# Patient Record
Sex: Female | Born: 1970 | Race: White | Hispanic: No | Marital: Married | Smoking: Never smoker
Health system: Southern US, Community
[De-identification: ages and names within clinical notes are randomized; demographics above are authoritative.]

## PROBLEM LIST (undated history)

## (undated) DIAGNOSIS — E079 Disorder of thyroid, unspecified: Secondary | ICD-10-CM

## (undated) DIAGNOSIS — J189 Pneumonia, unspecified organism: Secondary | ICD-10-CM

## (undated) DIAGNOSIS — E119 Type 2 diabetes mellitus without complications: Secondary | ICD-10-CM

## (undated) DIAGNOSIS — E039 Hypothyroidism, unspecified: Secondary | ICD-10-CM

## (undated) DIAGNOSIS — T8859XA Other complications of anesthesia, initial encounter: Secondary | ICD-10-CM

## (undated) DIAGNOSIS — I10 Essential (primary) hypertension: Secondary | ICD-10-CM

## (undated) DIAGNOSIS — F32A Depression, unspecified: Secondary | ICD-10-CM

## (undated) DIAGNOSIS — F329 Major depressive disorder, single episode, unspecified: Secondary | ICD-10-CM

## (undated) DIAGNOSIS — R112 Nausea with vomiting, unspecified: Secondary | ICD-10-CM

## (undated) DIAGNOSIS — J45909 Unspecified asthma, uncomplicated: Secondary | ICD-10-CM

## (undated) DIAGNOSIS — R0602 Shortness of breath: Secondary | ICD-10-CM

## (undated) DIAGNOSIS — G709 Myoneural disorder, unspecified: Secondary | ICD-10-CM

## (undated) DIAGNOSIS — Z9889 Other specified postprocedural states: Secondary | ICD-10-CM

## (undated) DIAGNOSIS — T7840XA Allergy, unspecified, initial encounter: Secondary | ICD-10-CM

## (undated) DIAGNOSIS — Z8489 Family history of other specified conditions: Secondary | ICD-10-CM

## (undated) DIAGNOSIS — G4730 Sleep apnea, unspecified: Secondary | ICD-10-CM

## (undated) DIAGNOSIS — T4145XA Adverse effect of unspecified anesthetic, initial encounter: Secondary | ICD-10-CM

## (undated) HISTORY — DX: Major depressive disorder, single episode, unspecified: F32.9

## (undated) HISTORY — DX: Sleep apnea, unspecified: G47.30

## (undated) HISTORY — PX: WISDOM TOOTH EXTRACTION: SHX21

## (undated) HISTORY — DX: Type 2 diabetes mellitus without complications: E11.9

## (undated) HISTORY — DX: Shortness of breath: R06.02

## (undated) HISTORY — DX: Myoneural disorder, unspecified: G70.9

## (undated) HISTORY — DX: Depression, unspecified: F32.A

## (undated) HISTORY — PX: NASAL SINUS SURGERY: SHX719

## (undated) HISTORY — DX: Allergy, unspecified, initial encounter: T78.40XA

---
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
## 2012-10-09 HISTORY — PX: REDUCTION MAMMAPLASTY: SUR839

## 2013-01-07 HISTORY — PX: BREAST SURGERY: SHX581

## 2013-12-16 LAB — CBC AND DIFFERENTIAL
HCT: 42 % (ref 36–46)
HEMOGLOBIN: 14.5 g/dL (ref 12.0–16.0)
Neutrophils Absolute: 6 /uL
PLATELETS: 245 10*3/uL (ref 150–399)
WBC: 8.2 10*3/mL

## 2014-02-06 HISTORY — PX: NASAL SINUS SURGERY: SHX719

## 2014-02-09 LAB — HM PAP SMEAR: HM PAP: NORMAL

## 2014-02-09 LAB — HM MAMMOGRAPHY: HM MAMMO: NEGATIVE

## 2015-05-24 ENCOUNTER — Other Ambulatory Visit: Payer: Self-pay | Admitting: Student

## 2015-05-24 DIAGNOSIS — Z1231 Encounter for screening mammogram for malignant neoplasm of breast: Secondary | ICD-10-CM

## 2015-07-07 ENCOUNTER — Ambulatory Visit
Admission: EM | Admit: 2015-07-07 | Discharge: 2015-07-07 | Disposition: A | Discharge status: Home / Self Care | Payer: 59 | Attending: Family Medicine | Admitting: Family Medicine

## 2015-07-07 DIAGNOSIS — J069 Acute upper respiratory infection, unspecified: Secondary | ICD-10-CM

## 2015-07-07 HISTORY — DX: Unspecified asthma, uncomplicated: J45.909

## 2015-07-07 HISTORY — DX: Disorder of thyroid, unspecified: E07.9

## 2015-07-07 HISTORY — DX: Pneumonia, unspecified organism: J18.9

## 2015-07-07 MED ORDER — HYDROCOD POLST-CPM POLST ER 10-8 MG/5ML PO SUER: 5.0000 mL | Freq: Every evening | ORAL | Status: DC | PRN

## 2015-07-07 MED ORDER — AZITHROMYCIN 250 MG PO TABS: ORAL_TABLET | ORAL | Status: DC

## 2015-07-07 NOTE — ED Provider Notes (Signed)
SUBJECTIVE:  Bridget Peters is a 44 y.o. female who complains of sore throat, nasal congestion, cough, right ear pain for the last few days. Denies CP, SOB, N/V/D, abdominal pain, severe headache. Has tried OTC Medication without much relief. Has asthma and takes Singulair and Albuterol prn. Denies smoking hx.    OBJECTIVE: She appears well, vital signs are as noted.  General: NAD HEENT: mild pharyngeal erythema, no exudate, no erythema of TMs, no cervical LAD Respiratory: minimal expiratory wheeze, no accessory muscle use  Cardiology: RRR Neurological: CN II-XII grossly intact   ASSESSMENT:  URI  PLAN: Zithromax, OTC cough suppressant during the day, Tussionex at bedtime prn, Singulair, Albuterol prn, rest, hydration, seek medical attention if symptoms persist or worsen as discussed. Patient needs to monitor blood pressure and if remains elevated (>140/90) needs to f/u with PMD.        Paulina Fusi, MD 07/07/15 [phone]

## 2015-07-07 NOTE — ED Notes (Signed)
Pt states "sinus pressure, headache, ear pain started last Tuesday. I went to the doctor on Monday, I'm worse, short of breath, coughing all night."

## 2015-07-14 ENCOUNTER — Encounter: Payer: Self-pay | Admitting: Nurse Practitioner

## 2015-07-14 ENCOUNTER — Ambulatory Visit (INDEPENDENT_AMBULATORY_CARE_PROVIDER_SITE_OTHER): Payer: 59 | Admitting: Nurse Practitioner

## 2015-07-14 ENCOUNTER — Encounter (INDEPENDENT_AMBULATORY_CARE_PROVIDER_SITE_OTHER): Payer: Self-pay

## 2015-07-14 VITALS — BP 150/96 | HR 87 | Temp 98.3°F | Resp 16 | Ht 62.5 in | Wt 169.2 lb

## 2015-07-14 DIAGNOSIS — E039 Hypothyroidism, unspecified: Secondary | ICD-10-CM

## 2015-07-14 DIAGNOSIS — J4540 Moderate persistent asthma, uncomplicated: Secondary | ICD-10-CM | POA: Insufficient documentation

## 2015-07-14 DIAGNOSIS — Z1231 Encounter for screening mammogram for malignant neoplasm of breast: Secondary | ICD-10-CM | POA: Insufficient documentation

## 2015-07-14 DIAGNOSIS — J069 Acute upper respiratory infection, unspecified: Secondary | ICD-10-CM

## 2015-07-14 DIAGNOSIS — R7303 Prediabetes: Secondary | ICD-10-CM | POA: Insufficient documentation

## 2015-07-14 DIAGNOSIS — E119 Type 2 diabetes mellitus without complications: Secondary | ICD-10-CM

## 2015-07-14 DIAGNOSIS — Z7189 Other specified counseling: Secondary | ICD-10-CM

## 2015-07-14 DIAGNOSIS — Z91048 Other nonmedicinal substance allergy status: Secondary | ICD-10-CM

## 2015-07-14 DIAGNOSIS — Z7689 Persons encountering health services in other specified circumstances: Secondary | ICD-10-CM

## 2015-07-14 DIAGNOSIS — J4541 Moderate persistent asthma with (acute) exacerbation: Secondary | ICD-10-CM

## 2015-07-14 DIAGNOSIS — Z9109 Other allergy status, other than to drugs and biological substances: Secondary | ICD-10-CM | POA: Insufficient documentation

## 2015-07-14 MED ORDER — HYDROCOD POLST-CPM POLST ER 10-8 MG/5ML PO SUER: 5.0000 mL | Freq: Every evening | ORAL | Status: DC | PRN

## 2015-07-14 MED ORDER — PREDNISONE 10 MG PO TABS: ORAL_TABLET | ORAL | Status: DC

## 2015-07-14 MED ORDER — DOXYCYCLINE HYCLATE 100 MG PO TABS: 100.0000 mg | ORAL_TABLET | Freq: Two times a day (BID) | ORAL | Status: DC

## 2015-07-14 MED ORDER — CANAGLIFLOZIN 300 MG PO TABS: 300.0000 mg | ORAL_TABLET | Freq: Every day | ORAL | Status: DC

## 2015-07-14 NOTE — Assessment & Plan Note (Signed)
Using Nebs, singulair, and inhaler. Somewhat helpful. Requesting Prednisone taper. Also, referring to pulmonology to establish care.

## 2015-07-14 NOTE — Patient Instructions (Addendum)
Nice to meet you! Welcome to [facility]!   Take the antibiotic as prescribed.   Please take a probiotic ( Align, Floraque or Culturelle) while you are on the antibiotic to prevent a serious antibiotic associated diarrhea  Called clostirudium dificile colitis and a vaginal yeast infection.   Tussionex- will make you drowsy, caution with driving or doing any legal matters. Take 1 teaspoon (5 mL) at night.   Prednisone with breakfast  6 tablets on day 1, 5 tablets on day 2, 4 tablets on day 3, 3 tablets on day 4, 2 tablets day 5, 1 tablet on day 6...done! Take together, not spread out throughout day, and don't take with NSAIDs like Ibuprofen.   We will contact you with your referrals.  Follow up in 2 months.

## 2015-07-14 NOTE — Assessment & Plan Note (Signed)
UC on 07/07/15. Pt reports z-packs not helpful, usually does well with doxy. Will give for bid x 7 days, encouraged probiotics. Prednisone taper given. Tussionex script given for pt to take to pharmacy. She ran out of the last bottle given and it was helpful.

## 2015-07-14 NOTE — Assessment & Plan Note (Signed)
Will obtain TSH and T4 levels today. Will follow as needed. Declines needing refills.

## 2015-07-14 NOTE — Progress Notes (Signed)
Pre visit review using our clinic review tool, if applicable. No additional management support is needed unless otherwise documented below in the visit note. 

## 2015-07-14 NOTE — Assessment & Plan Note (Addendum)
Needs to establish with allergist. Pt reports many food and environmental allergies. Denies Drug allergies. Allergist referral placed

## 2015-07-14 NOTE — Assessment & Plan Note (Addendum)
Will obtain A1c, microalbumin, and CMET today. Needs refills on Invokana 300 mg. Encouraged eye exam and will obtain foot exam at next visit.   Pneumovax in 2013

## 2015-07-14 NOTE — Progress Notes (Signed)
Patient ID: Bridget Peters, female    DOB: 02-Mar-1971  Age: 44 y.o. MRN: [id number]  CC: Establish Care   HPI Bridget Peters presents for establishing care and CC of URI symptoms.   1) New pt info:  Immunizations- pneumovax 2013, tdap within last 5 years   Flu- will get at work   Mammogram- Breast reduction in 2014   2015 normal   Pap- 2015   Eye Exam- 2014 Dec.  Dental exam- UTD  Foot exam- 2015   LMP- Age 41 post-menopausal   2) Chronic Problems-  Asthma- Inhaler, nebs, need pulmonologist  Diabetes- Invokana, checked last year   Allergies- Sinus surgery in 02/2014  Thyroid disease- hypothyroidism, checked last year 4-5 years stable on 75 mcg    Counseling in past 5 years  3) Acute Problems-  URI- went to urgent care last Wednesday with Z-pack given to pt. She has asthma and this is affecting her. Tussionex given for bedtime coughing, encouraged to continue singulair, albuterol and hydration. Pts BP was elevated 172/81 at the urgent care on 07/07/15.   Tried robitussin and mucinex.  Used nebulizer    BP is elevated.   Dr. Ernestina Patches in [place] Dr. Owens Shark in [place] and Allergist   History Bridget Peters has a past medical history of Asthma; Thyroid disease; Pneumonia; Diabetes mellitus without complication ([place]); and Allergy.   She has past surgical history that includes Nasal sinus surgery; Breast surgery (01/2013); and Nasal sinus surgery (02/2014).   Her family history includes Cancer in her maternal aunt, maternal grandfather, maternal grandmother, and paternal aunt.She reports that she has never smoked. She has never used smokeless tobacco. She reports that she does not drink alcohol or use illicit drugs.  Outpatient Prescriptions Prior to Visit  Medication Sig Dispense Refill  . fluticasone (FLONASE) 50 MCG/ACT nasal spray Place into both nostrils daily.    Marland Kitchen guaiFENesin (MUCINEX) 600 MG 12 hr tablet Take by mouth 2 (two) times daily.    Marland Kitchen guaiFENesin  (ROBITUSSIN) 100 MG/5ML liquid Take 200 mg by mouth 3 (three) times daily as needed for cough.    . mometasone-formoterol (DULERA) 100-5 MCG/ACT AERO Inhale 2 puffs into the lungs 2 (two) times daily.    . montelukast (SINGULAIR) 10 MG tablet Take 10 mg by mouth at bedtime.    Marland Kitchen azithromycin (ZITHROMAX Z-PAK) 250 MG tablet Use as directed for 5 days. (Patient not taking: Reported on 07/14/2015) 6 each 0  . chlorpheniramine-HYDROcodone (TUSSIONEX PENNKINETIC ER) 10-8 MG/5ML SUER Take 5 mLs by mouth at bedtime as needed for cough. May cause drowsiness. 120 mL 0   No facility-administered medications prior to visit.    ROS Review of Systems  Constitutional: Negative for fever, chills, diaphoresis and fatigue.  HENT: Positive for congestion. Negative for sore throat, tinnitus and trouble swallowing.   Respiratory: Positive for cough. Negative for chest tightness, shortness of breath and wheezing.   Cardiovascular: Negative for chest pain, palpitations and leg swelling.  Gastrointestinal: Negative for nausea, vomiting and diarrhea.  Skin: Negative for rash.  Neurological: Positive for headaches. Negative for dizziness, weakness and numbness.  Psychiatric/Behavioral: The patient is not nervous/anxious.     Objective:  BP 150/96 mmHg  Pulse 87  Temp(Src) 98.3 F (36.8 C)  Resp 16  Ht 5' 2.5" (1.588 m)  Wt 169 lb 3.2 oz (76.749 kg)  BMI 30.43 kg/m2  SpO2 97%  Physical Exam  Constitutional: She is oriented to person, place, and time. She appears well-developed  and well-nourished. No distress.  HENT:  Head: Normocephalic and atraumatic.  Right Ear: External ear normal.  Left Ear: External ear normal.  Mouth/Throat: No oropharyngeal exudate.  TM's visible landmarks, mild scarring on left, injected on right  Eyes: Right eye exhibits no discharge. Left eye exhibits no discharge. No scleral icterus.  Neck: Normal range of motion. Neck supple. No thyromegaly present.  Cardiovascular: Normal  rate, regular rhythm and normal heart sounds.  Exam reveals no gallop and no friction rub.   No murmur heard. Pulmonary/Chest: Effort normal. No respiratory distress. She has wheezes. She has no rales. She exhibits no tenderness.  Rhonchi  Lymphadenopathy:    She has no cervical adenopathy.  Neurological: She is alert and oriented to person, place, and time. No cranial nerve deficit. She exhibits normal muscle tone. Coordination normal.  Skin: Skin is warm and dry. No rash noted. She is not diaphoretic.  Psychiatric: She has a normal mood and affect. Her behavior is normal. Judgment and thought content normal.   Assessment & Plan:   Bridget Peters was seen today for establish care.  Diagnoses and all orders for this visit:  Encounter to establish care  Hypothyroidism, unspecified hypothyroidism type -     TSH -     T4, free  Controlled type 2 diabetes mellitus without complication, without long-term current use of insulin (HCC) -     HgB A1c -     Urine Microalbumin w/creat. ratio -     CBC w/Diff -     Comprehensive metabolic panel  Multiple environmental allergies -     Ambulatory referral to Allergy  Asthma, moderate persistent, with acute exacerbation -     Ambulatory referral to Pulmonology  Acute upper respiratory infection  Other orders -     doxycycline (VIBRA-TABS) 100 MG tablet; Take 1 tablet (100 mg total) by mouth 2 (two) times daily. -     canagliflozin (INVOKANA) 300 MG TABS tablet; Take 300 mg by mouth daily. -     predniSONE (DELTASONE) 10 MG tablet; Take 6 tablets by mouth on day 1 with breakfast then decrease by 1 tablet each day until gone. -     chlorpheniramine-HYDROcodone (TUSSIONEX PENNKINETIC ER) 10-8 MG/5ML SUER; Take 5 mLs by mouth at bedtime as needed for cough. May cause drowsiness.   I have discontinued Ms. Boyack's azithromycin. I have also changed her INVOKANA to canagliflozin. Additionally, I am having her start on doxycycline and predniSONE.  Lastly, I am having her maintain her fluticasone, mometasone-formoterol, montelukast, guaiFENesin, guaiFENesin, levothyroxine, and chlorpheniramine-HYDROcodone.  Meds ordered this encounter  Medications  . DISCONTD: INVOKANA 300 MG TABS tablet    Sig: Take 300 tablets by mouth daily.    Refill:  8  . levothyroxine (SYNTHROID, LEVOTHROID) 75 MCG tablet    Sig: Take 75 tablets by mouth daily.    Refill:  2  . doxycycline (VIBRA-TABS) 100 MG tablet    Sig: Take 1 tablet (100 mg total) by mouth 2 (two) times daily.    Dispense:  14 tablet    Refill:  0    Order Specific Question:  Supervising Provider    Answer:  Deborra Medina L [[id number]]  . canagliflozin (INVOKANA) 300 MG TABS tablet    Sig: Take 300 mg by mouth daily.    Dispense:  30 tablet    Refill:  2    Order Specific Question:  Supervising Provider    Answer:  Deborra Medina L [[id number]]  . predniSONE (DELTASONE)  10 MG tablet    Sig: Take 6 tablets by mouth on day 1 with breakfast then decrease by 1 tablet each day until gone.    Dispense:  21 tablet    Refill:  0    Order Specific Question:  Supervising Provider    Answer:  Deborra Medina L [[id number]]  . chlorpheniramine-HYDROcodone (TUSSIONEX PENNKINETIC ER) 10-8 MG/5ML SUER    Sig: Take 5 mLs by mouth at bedtime as needed for cough. May cause drowsiness.    Dispense:  120 mL    Refill:  0    Order Specific Question:  Supervising Provider    Answer:  Crecencio Mc [[id number]]     Follow-up: Return in about 2 months (around 09/13/2015) for Follow up .

## 2015-07-14 NOTE — Assessment & Plan Note (Signed)
Discussed acute and chronic issues. Reviewed health maintenance measures, PFSHx, and immunizations. Obtained records, reviewing and sending to scan.

## 2015-07-15 ENCOUNTER — Encounter: Payer: Self-pay | Admitting: Nurse Practitioner

## 2015-07-15 LAB — CBC WITH DIFFERENTIAL/PLATELET
BASOS ABS: 0 10*3/uL (ref 0.0–0.1)
Basophils Relative: 0.3 % (ref 0.0–3.0)
EOS ABS: 0.2 10*3/uL (ref 0.0–0.7)
Eosinophils Relative: 2.1 % (ref 0.0–5.0)
HEMATOCRIT: 45.2 % (ref 36.0–46.0)
HEMOGLOBIN: 15.1 g/dL — AB (ref 12.0–15.0)
LYMPHS PCT: 32.8 % (ref 12.0–46.0)
Lymphs Abs: 2.6 10*3/uL (ref 0.7–4.0)
MCHC: 33.4 g/dL (ref 30.0–36.0)
MCV: 84.9 fl (ref 78.0–100.0)
MONOS PCT: 5.6 % (ref 3.0–12.0)
Monocytes Absolute: 0.4 10*3/uL (ref 0.1–1.0)
Neutro Abs: 4.7 10*3/uL (ref 1.4–7.7)
Neutrophils Relative %: 59.2 % (ref 43.0–77.0)
Platelets: 323 10*3/uL (ref 150.0–400.0)
RBC: 5.32 Mil/uL — AB (ref 3.87–5.11)
RDW: 13.4 % (ref 11.5–15.5)
WBC: 7.9 10*3/uL (ref 4.0–10.5)

## 2015-07-15 LAB — T4, FREE: Free T4: 0.89 ng/dL (ref 0.60–1.60)

## 2015-07-15 LAB — HEMOGLOBIN A1C: Hgb A1c MFr Bld: 7.5 % — ABNORMAL HIGH (ref 4.6–6.5)

## 2015-07-15 LAB — COMPREHENSIVE METABOLIC PANEL
ALBUMIN: 4.7 g/dL (ref 3.5–5.2)
ALK PHOS: 118 U/L — AB (ref 39–117)
ALT: 39 U/L — AB (ref 0–35)
AST: 24 U/L (ref 0–37)
BILIRUBIN TOTAL: 0.4 mg/dL (ref 0.2–1.2)
BUN: 14 mg/dL (ref 6–23)
CALCIUM: 10.3 mg/dL (ref 8.4–10.5)
CO2: 24 meq/L (ref 19–32)
CREATININE: 1.01 mg/dL (ref 0.40–1.20)
Chloride: 98 mEq/L (ref 96–112)
GFR: 63.14 mL/min (ref >60.00)
Glucose, Bld: 195 mg/dL — ABNORMAL HIGH (ref 70–99)
Potassium: 4.6 mEq/L (ref 3.5–5.1)
Sodium: 137 mEq/L (ref 135–145)
TOTAL PROTEIN: 8 g/dL (ref 6.0–8.3)

## 2015-07-15 LAB — TSH: TSH: 1.81 u[IU]/mL (ref 0.35–4.50)

## 2015-07-15 LAB — MICROALBUMIN / CREATININE URINE RATIO
CREATININE, U: 46.2 mg/dL
MICROALB UR: 0.7 mg/dL (ref 0.0–1.9)
Microalb Creat Ratio: 1.5 mg/g (ref 0.0–30.0)

## 2015-07-28 ENCOUNTER — Other Ambulatory Visit: Payer: Self-pay | Admitting: Nurse Practitioner

## 2015-07-28 ENCOUNTER — Telehealth: Payer: Self-pay

## 2015-07-28 MED ORDER — MONTELUKAST SODIUM 10 MG PO TABS: 10.0000 mg | ORAL_TABLET | Freq: Every day | ORAL | Status: DC

## 2015-07-28 NOTE — Telephone Encounter (Signed)
I received a phone message from patient stating that she has made an appt with the allergist, but they couldn't get her in before November 8. Patient states that she has been prescribed Singulair before, and wants to know if some can be sent in her for just to last until her allergist appt? Thank you!

## 2015-07-28 NOTE — Telephone Encounter (Signed)
I sent it to her pharmacy. Thanks!

## 2015-08-03 ENCOUNTER — Institutional Professional Consult (permissible substitution): Payer: 59 | Admitting: Internal Medicine

## 2015-08-06 ENCOUNTER — Ambulatory Visit (INDEPENDENT_AMBULATORY_CARE_PROVIDER_SITE_OTHER): Payer: 59 | Admitting: Internal Medicine

## 2015-08-06 ENCOUNTER — Encounter: Payer: Self-pay | Admitting: Internal Medicine

## 2015-08-06 VITALS — BP 116/72 | HR 84 | Ht 63.0 in | Wt 167.0 lb

## 2015-08-06 DIAGNOSIS — K219 Gastro-esophageal reflux disease without esophagitis: Secondary | ICD-10-CM | POA: Diagnosis not present

## 2015-08-06 DIAGNOSIS — J309 Allergic rhinitis, unspecified: Secondary | ICD-10-CM

## 2015-08-06 MED ORDER — FLUTICASONE FUROATE 27.5 MCG/SPRAY NA SUSP: 2.0000 | Freq: Every day | NASAL | Status: DC

## 2015-08-06 MED ORDER — UMECLIDINIUM BROMIDE 62.5 MCG/INH IN AEPB: 1.0000 | INHALATION_SPRAY | Freq: Every day | RESPIRATORY_TRACT | Status: DC

## 2015-08-06 MED ORDER — FLUTICASONE FUROATE-VILANTEROL 200-25 MCG/INH IN AEPB
1.0000 | INHALATION_SPRAY | Freq: Every day | RESPIRATORY_TRACT | Status: AC
Start: 2015-08-06 — End: 2015-08-07

## 2015-08-06 MED ORDER — PANTOPRAZOLE SODIUM 40 MG PO TBEC: 40.0000 mg | DELAYED_RELEASE_TABLET | Freq: Every day | ORAL | Status: DC

## 2015-08-06 MED ORDER — FLUTICASONE FUROATE-VILANTEROL 200-25 MCG/INH IN AEPB: 1.0000 | INHALATION_SPRAY | Freq: Every day | RESPIRATORY_TRACT | Status: DC

## 2015-08-06 MED ORDER — UMECLIDINIUM BROMIDE 62.5 MCG/INH IN AEPB: 1.0000 | INHALATION_SPRAY | Freq: Every day | RESPIRATORY_TRACT | Status: AC

## 2015-08-06 NOTE — Progress Notes (Signed)
[facility] Consultation      Date: 08/06/2015,   MRN# [id number] Navah Grondin 04/08/1971 Code Status:  Hosp day:@LENGTHOFSTAYDAYS @ Referring MD: @ATDPROV @     PCP:      AdmissionWeight: 167 lb (75.751 kg)                 CurrentWeight: 167 lb (75.751 kg) Laylia Carboni is a 44 y.o. old female seen in consultation for asthma     CHIEF COMPLAINT:   Sob, cough and intermittent wheezing   HISTORY OF PRESENT ILLNESS  44 yo white female with dx of ASTHMA dx approx 10 years ago Patient has seen allergist and pulmonologist in the past Patient on [place] and does not seem to help at this time She had 2 exacerbations in the past 2 years, no h/o intubations at this time She has chronic allergic rhinitis s/p sinus surgery in the past She has intermittent cough and SOB with wheezing, has taken douneb last night She has no fevers,chills or signs of infection at this time, she is nonsmoker She has tried multiple antihistamines and none have worked, she takes flonase does not help,  and singulair and  seems to help  Her last prednisone taper was 1 month ago from Urgent care  PAST MEDICAL HISTORY   Past Medical History  Diagnosis Date  . Asthma   . Thyroid disease   . Pneumonia   . Diabetes mellitus without complication ([place])   . Allergy     Seasonal     SURGICAL HISTORY   Past Surgical History  Procedure Laterality Date  . Nasal sinus surgery    . Breast surgery  01/2013    Breast Reduction   . Nasal sinus surgery  02/2014     FAMILY HISTORY   Family History  Problem Relation Age of Onset  . Cancer Maternal Aunt     Breast Cancer  . Cancer Paternal Aunt     Breast Cancer  . Cancer Maternal Grandmother     Bladder Cancer  . Stroke Maternal Grandmother   . Cancer Maternal Grandfather     Esophagus Cancer  . Hypertension Mother   . Hypertension Father      SOCIAL HISTORY   Social History  Substance Use Topics  . Smoking status:  Never Smoker   . Smokeless tobacco: Never Used  . Alcohol Use: No     MEDICATIONS    Home Medication:  Current Outpatient Rx  Name  Route  Sig  Dispense  Refill  . canagliflozin (INVOKANA) 300 MG TABS tablet   Oral   Take 300 mg by mouth daily.   30 tablet   2   . chlorpheniramine-HYDROcodone (TUSSIONEX PENNKINETIC ER) 10-8 MG/5ML SUER   Oral   Take 5 mLs by mouth at bedtime as needed for cough. May cause drowsiness.   120 mL   0   . doxycycline (VIBRA-TABS) 100 MG tablet   Oral   Take 1 tablet (100 mg total) by mouth 2 (two) times daily.   14 tablet   0   . fluticasone (FLONASE) 50 MCG/ACT nasal spray   Each Nare   Place into both nostrils daily.         Marland Kitchen guaiFENesin (MUCINEX) 600 MG 12 hr tablet   Oral   Take by mouth 2 (two) times daily.         Marland Kitchen levothyroxine (SYNTHROID, LEVOTHROID) 75 MCG tablet   Oral   Take 75 tablets by mouth  daily.      2   . mometasone-formoterol (DULERA) 100-5 MCG/ACT AERO   Inhalation   Inhale 2 puffs into the lungs 2 (two) times daily.         . montelukast (SINGULAIR) 10 MG tablet   Oral   Take 1 tablet (10 mg total) by mouth at bedtime.   30 tablet   1   . predniSONE (DELTASONE) 10 MG tablet      Take 6 tablets by mouth on day 1 with breakfast then decrease by 1 tablet each day until gone.   21 tablet   0   . fluticasone (VERAMYST) 27.5 MCG/SPRAY nasal spray   Nasal   Place 2 sprays into the nose daily.   10 g   12   . pantoprazole (PROTONIX) 40 MG tablet   Oral   Take 1 tablet (40 mg total) by mouth daily.   30 tablet   1     Current Medication:  Current outpatient prescriptions:  .  canagliflozin (INVOKANA) 300 MG TABS tablet, Take 300 mg by mouth daily., Disp: 30 tablet, Rfl: 2 .  chlorpheniramine-HYDROcodone (TUSSIONEX PENNKINETIC ER) 10-8 MG/5ML SUER, Take 5 mLs by mouth at bedtime as needed for cough. May cause drowsiness., Disp: 120 mL, Rfl: 0 .  doxycycline (VIBRA-TABS) 100 MG tablet, Take 1  tablet (100 mg total) by mouth 2 (two) times daily., Disp: 14 tablet, Rfl: 0 .  fluticasone (FLONASE) 50 MCG/ACT nasal spray, Place into both nostrils daily., Disp: , Rfl:  .  guaiFENesin (MUCINEX) 600 MG 12 hr tablet, Take by mouth 2 (two) times daily., Disp: , Rfl:  .  levothyroxine (SYNTHROID, LEVOTHROID) 75 MCG tablet, Take 75 tablets by mouth daily., Disp: , Rfl: 2 .  mometasone-formoterol (DULERA) 100-5 MCG/ACT AERO, Inhale 2 puffs into the lungs 2 (two) times daily., Disp: , Rfl:  .  montelukast (SINGULAIR) 10 MG tablet, Take 1 tablet (10 mg total) by mouth at bedtime., Disp: 30 tablet, Rfl: 1 .  predniSONE (DELTASONE) 10 MG tablet, Take 6 tablets by mouth on day 1 with breakfast then decrease by 1 tablet each day until gone., Disp: 21 tablet, Rfl: 0 .  fluticasone (VERAMYST) 27.5 MCG/SPRAY nasal spray, Place 2 sprays into the nose daily., Disp: 10 g, Rfl: 12 .  pantoprazole (PROTONIX) 40 MG tablet, Take 1 tablet (40 mg total) by mouth daily., Disp: 30 tablet, Rfl: 1    ALLERGIES   Shellfish allergy     REVIEW OF SYSTEMS   Review of Systems  Constitutional: Negative for fever, chills, weight loss, malaise/fatigue and diaphoresis.  HENT: Positive for congestion. Negative for hearing loss.   Eyes: Negative for blurred vision and double vision.  Respiratory: Positive for cough, shortness of breath and wheezing.   Cardiovascular: Negative for chest pain, palpitations and orthopnea.  Gastrointestinal: Negative for heartburn, nausea, vomiting, abdominal pain, diarrhea, constipation and blood in stool.  Musculoskeletal: Negative.   Skin: Negative for rash.  Neurological: Negative.  Negative for weakness and headaches.  Endo/Heme/Allergies: Negative.   Psychiatric/Behavioral: Negative.   All other systems reviewed and are negative.    VS: BP 116/72 mmHg  Pulse 84  Ht 5\' 3"  (1.6 m)  Wt 167 lb (75.751 kg)  BMI 29.59 kg/m2  SpO2 98%     PHYSICAL EXAM  Physical Exam    Constitutional: She is oriented to person, place, and time. She appears well-developed and well-nourished. No distress.  HENT:  Head: Normocephalic and atraumatic.  Mouth/Throat:  No oropharyngeal exudate.  Eyes: EOM are normal. Pupils are equal, round, and reactive to light.  Neck: Normal range of motion. Neck supple.  Cardiovascular: Normal rate, regular rhythm and normal heart sounds.   No murmur heard. Pulmonary/Chest: No respiratory distress. She has no wheezes.  Abdominal: Soft. Bowel sounds are normal.  Musculoskeletal: Normal range of motion.  Neurological: She is alert and oriented to person, place, and time.  Skin: Skin is warm. She is not diaphoretic.  Psychiatric: She has a normal mood and affect.        ASSESSMENT/PLAN   44 yo white female seen today for chronic persistent asthma without exacerbation at this time.   Plan:  1.avoid all triggers 2.start breo-ellipta and incruse 3.dounebs as needed every 4 hrs 4.start PPI 5.change flonase to veramyst 6.continue singulair 7.netti pot if it helps  Follow up in 3 months  I have personally obtained a history, examined the patient, evaluated laboratory and independently reviewed imaging results, formulated the assessment and plan and placed orders. The Patient requires high complexity decision making for assessment and support, frequent evaluation and titration of therapies, application of advanced monitoring technologies and extensive interpretation of multiple databases.  Patient satisfied with Plan of action and management. All questions answered  [name], M.D.  [facility]  Medical Director [place] Director [facility] Cardio-P[facility] Department

## 2015-08-06 NOTE — Addendum Note (Signed)
Addended by: Maryanna Shape A on: 08/06/2015 11:08 AM   Modules accepted: Orders

## 2015-08-06 NOTE — Patient Instructions (Signed)
Asthma Attack Prevention While you may not be able to control the fact that you have asthma, you can take actions to prevent asthma attacks. The best way to prevent asthma attacks is to maintain good control of your asthma. You can achieve this by:  Taking your medicines as directed.  Avoiding things that can irritate your airways or make your asthma symptoms worse (asthma triggers).  Keeping track of how well your asthma is controlled and of any changes in your symptoms.  Responding quickly to worsening asthma symptoms (asthma attack).  Seeking emergency care when it is needed. WHAT ARE SOME WAYS TO PREVENT AN ASTHMA ATTACK? Have a Plan Work with your health care provider to create a written plan for managing and treating your asthma attacks (asthma action plan). This plan includes:  A list of your asthma triggers and how you can avoid them.  Information on when medicines should be taken and when their dosages should be changed.  The use of a device that measures how well your lungs are working (peak flow meter). Monitor Your Asthma Use your peak flow meter and record your results in a journal every day. A drop in your peak flow numbers on one or more days may indicate the start of an asthma attack. This can happen even before you start to feel symptoms. You can prevent an asthma attack from getting worse by following the steps in your asthma action plan. Avoid Asthma Triggers Work with your asthma health care provider to find out what your asthma triggers are. This can be done by:  Allergy testing.  Keeping a journal that notes when asthma attacks occur and the factors that may have contributed to them.  Determining if there are other medical conditions that are making your asthma worse. Once you have determined your asthma triggers, take steps to avoid them. This may include avoiding excessive or prolonged exposure to:  Dust. Have someone dust and vacuum your home for you once or  twice a week. Using a high-efficiency particulate arrestance (HEPA) vacuum is best.  Smoke. This includes campfire smoke, forest fire smoke, and secondhand smoke from tobacco products.  Pet dander. Avoid contact with animals that you know you are allergic to.  Allergens from trees, grasses or pollens. Avoid spending a lot of time outdoors when pollen counts are high, and on very windy days.  Very cold, dry, or humid air.  Mold.  Foods that contain high amounts of sulfites.  Strong odors.  Outdoor air pollutants, such as engine exhaust.  Indoor air pollutants, such as aerosol sprays and fumes from household cleaners.  Household pests, including dust mites and cockroaches, and pest droppings.  Certain medicines, including NSAIDs. Always talk to your health care provider before stopping or starting any new medicines. Medicines Take over-the-counter and prescription medicines only as told by your health care provider. Many asthma attacks can be prevented by carefully following your medicine schedule. Taking your medicines correctly is especially important when you cannot avoid certain asthma triggers. Act Quickly If an asthma attack does happen, acting quickly can decrease how severe it is and how long it lasts. Take these steps:   Pay attention to your symptoms. If you are coughing, wheezing, or having difficulty breathing, do not wait to see if your symptoms go away on their own. Follow your asthma action plan.  If you have followed your asthma action plan and your symptoms are not improving, call your health care provider or seek immediate medical care   at the nearest hospital. It is important to note how often you need to use your fast-acting rescue inhaler. If you are using your rescue inhaler more often, it may mean that your asthma is not under control. Adjusting your asthma treatment plan may help you to prevent future asthma attacks and help you to gain better control of your  condition. HOW CAN I PREVENT AN ASTHMA ATTACK WHEN I EXERCISE? Follow advice from your health care provider about whether you should use your fast-acting inhaler before exercising. Many people with asthma experience exercise-induced bronchoconstriction (EIB). This condition often worsens during vigorous exercise in cold, humid, or dry environments. Usually, people with EIB can stay very active by pre-treating with a fast-acting inhaler before exercising.   This information is not intended to replace advice given to you by your health care provider. Make sure you discuss any questions you have with your health care provider.   Document Released: 09/13/2009 Document Revised: 06/16/2015 Document Reviewed: 02/25/2015 Elsevier Interactive Patient Education 2016 [employer]  

## 2015-09-06 ENCOUNTER — Telehealth: Payer: 59 | Admitting: Family

## 2015-09-06 DIAGNOSIS — J0120 Acute ethmoidal sinusitis, unspecified: Secondary | ICD-10-CM | POA: Diagnosis not present

## 2015-09-06 MED ORDER — AMOXICILLIN-POT CLAVULANATE 875-125 MG PO TABS
1.0000 | ORAL_TABLET | Freq: Two times a day (BID) | ORAL | Status: AC
Start: 2015-09-06 — End: 2015-09-16

## 2015-09-06 NOTE — Progress Notes (Signed)
We are sorry that you are not feeling well.  Here is how we plan to help!  Based on what you have shared with me it looks like you have sinusitis.  Sinusitis is inflammation and infection in the sinus cavities of the head.  Based on your presentation I believe you most likely have Acute Bacterial Sinusitis.  This is an infection caused by bacteria and is treated with antibiotics. I have prescribed Augmentin, an antibiotic in the penicillin family, one tablet twice daily with food, for 7 days. You may use an oral decongestant such as Mucinex D or if you have glaucoma or high blood pressure use plain Mucinex. Saline nasal spray help and can safely be used as often as needed for congestion.  If you develop worsening sinus pain, fever or notice severe headache and vision changes, or if symptoms are not better after completion of antibiotic, please schedule an appointment with a health care provider.    Sinus infections are not as easily transmitted as other respiratory infection, however we still recommend that you avoid close contact with loved ones, especially the very young and elderly.  Remember to wash your hands thoroughly throughout the day as this is the number one way to prevent the spread of infection!  Home Care:  Only take medications as instructed by your medical team.  Complete the entire course of an antibiotic.  Do not take these medications with alcohol.  A steam or ultrasonic humidifier can help congestion.  You can place a towel over your head and breathe in the steam from hot water coming from a faucet.  Avoid close contacts especially the very young and the elderly.  Cover your mouth when you cough or sneeze.  Always remember to wash your hands.  Get Help Right Away If:  You develop worsening fever or sinus pain.  You develop a severe head ache or visual changes.  Your symptoms persist after you have completed your treatment plan.  Make sure you  Understand these  instructions.  Will watch your condition.  Will get help right away if you are not doing well or get worse.  Your e-visit answers were reviewed by a board certified advanced clinical practitioner to complete your personal care plan.  Depending on the condition, your plan could have included both over the counter or prescription medications.  If there is a problem please reply  once you have received a response from your provider.  Your safety is important to [place].  If you have drug allergies check your prescription carefully.    You can use MyChart to ask questions about today's visit, request a non-urgent call back, or ask for a work or school excuse for 24 hours related to this e-Visit. If it has been greater than 24 hours you will need to follow up with your provider, or enter a new e-Visit to address those concerns.  You will get an e-mail in the next two days asking about your experience.  I hope that your e-visit has been valuable and will speed your recovery. Thank you for using e-visits.

## 2015-09-19 ENCOUNTER — Telehealth: Payer: 59 | Admitting: Family

## 2015-09-19 DIAGNOSIS — J329 Chronic sinusitis, unspecified: Secondary | ICD-10-CM | POA: Diagnosis not present

## 2015-09-19 DIAGNOSIS — B9689 Other specified bacterial agents as the cause of diseases classified elsewhere: Secondary | ICD-10-CM

## 2015-09-19 DIAGNOSIS — B3731 Acute candidiasis of vulva and vagina: Secondary | ICD-10-CM

## 2015-09-19 DIAGNOSIS — A499 Bacterial infection, unspecified: Secondary | ICD-10-CM

## 2015-09-19 DIAGNOSIS — B373 Candidiasis of vulva and vagina: Secondary | ICD-10-CM | POA: Diagnosis not present

## 2015-09-19 MED ORDER — LEVOFLOXACIN 750 MG PO TABS: 750.0000 mg | ORAL_TABLET | Freq: Every day | ORAL | Status: DC

## 2015-09-19 MED ORDER — FLUCONAZOLE 150 MG PO TABS: 150.0000 mg | ORAL_TABLET | Freq: Once | ORAL | Status: DC

## 2015-09-19 NOTE — Progress Notes (Signed)
We are sorry that you are not feeling well.  Here is how we plan to help!  Based on what you have shared with me it looks like you have sinusitis.  Sinusitis is inflammation and infection in the sinus cavities of the head.  Based on your presentation I believe you most likely have Acute Bacterial Sinusitis.  This is an infection caused by bacteria and is treated with antibiotics. I have prescribed Levaquin 750mg  daily for 5 days. Also, fluconazole in case you get a yeast infection from the two antibiotics.You may use an oral decongestant such as Mucinex D or if you have glaucoma or high blood pressure use plain Mucinex. Saline nasal spray help and can safely be used as often as needed for congestion.  If you develop worsening sinus pain, fever or notice severe headache and vision changes, or if symptoms are not better after completion of antibiotic, please schedule an appointment with a health care provider.    Sinus infections are not as easily transmitted as other respiratory infection, however we still recommend that you avoid close contact with loved ones, especially the very young and elderly.  Remember to wash your hands thoroughly throughout the day as this is the number one way to prevent the spread of infection!  Home Care:  Only take medications as instructed by your medical team.  Complete the entire course of an antibiotic.  Do not take these medications with alcohol.  A steam or ultrasonic humidifier can help congestion.  You can place a towel over your head and breathe in the steam from hot water coming from a faucet.  Avoid close contacts especially the very young and the elderly.  Cover your mouth when you cough or sneeze.  Always remember to wash your hands.  Get Help Right Away If:  You develop worsening fever or sinus pain.  You develop a severe head ache or visual changes.  Your symptoms persist after you have completed your treatment plan.  Make sure  you  Understand these instructions.  Will watch your condition.  Will get help right away if you are not doing well or get worse.  Your e-visit answers were reviewed by a board certified advanced clinical practitioner to complete your personal care plan.  Depending on the condition, your plan could have included both over the counter or prescription medications.  If there is a problem please reply  once you have received a response from your provider.  Your safety is important to [place].  If you have drug allergies check your prescription carefully.    You can use MyChart to ask questions about today's visit, request a non-urgent call back, or ask for a work or school excuse for 24 hours related to this e-Visit. If it has been greater than 24 hours you will need to follow up with your provider, or enter a new e-Visit to address those concerns.  You will get an e-mail in the next two days asking about your experience.  I hope that your e-visit has been valuable and will speed your recovery. Thank you for using e-visits.

## 2015-09-27 ENCOUNTER — Telehealth: Payer: Self-pay | Admitting: *Deleted

## 2015-09-29 ENCOUNTER — Encounter: Payer: Self-pay | Admitting: Nurse Practitioner

## 2015-09-29 ENCOUNTER — Ambulatory Visit (INDEPENDENT_AMBULATORY_CARE_PROVIDER_SITE_OTHER): Payer: 59 | Admitting: Nurse Practitioner

## 2015-09-29 ENCOUNTER — Ambulatory Visit: Payer: Self-pay | Admitting: Nurse Practitioner

## 2015-09-29 DIAGNOSIS — J069 Acute upper respiratory infection, unspecified: Secondary | ICD-10-CM

## 2015-09-29 DIAGNOSIS — B9789 Other viral agents as the cause of diseases classified elsewhere: Principal | ICD-10-CM

## 2015-09-29 LAB — POCT INFLUENZA A/B
INFLUENZA B, POC: NEGATIVE
Influenza A, POC: NEGATIVE

## 2015-09-29 MED ORDER — PREDNISONE 10 MG PO TABS: ORAL_TABLET | ORAL | Status: DC

## 2015-09-29 MED ORDER — METHYLPREDNISOLONE ACETATE 40 MG/ML IJ SUSP
40.0000 mg | Freq: Once | INTRAMUSCULAR | Status: AC
Start: 2015-09-29 — End: 2015-09-29
  Administered 2015-09-29: 40 mg via INTRAMUSCULAR

## 2015-09-29 NOTE — Assessment & Plan Note (Signed)
Patient has completed 2 rounds of antibiotics without relief. Patient reports the back of her mouth feels swollen, denies trouble swallowing fluids, but does report some trouble swallowing large amounts of food. Gave IM injection of Depo-Medrol today 40 mg/ mL. Patient was also given prednisone taper to take at home she is to follow-up with me via my chart message tomorrow or Friday. Instructions for prednisone taper were given verbally and on AVS. advise seeking emergency care if she has trouble breathing or swallowing fluids. Return to care if no improvement.

## 2015-09-29 NOTE — Progress Notes (Signed)
Patient ID: Eino Farber, female    DOB: 08-Nov-1970  Age: 44 y.o. MRN: [id number]  CC: Sore Throat   HPI Doshie Wi presents for Sore throat and cough x 3-4 weeks.   1) Pt reports after Thanksgiving was sick and has done 2 e-visits with amoxicillin and Levaquin given for proposed sinus infection.  Feeling bad again on Sunday with fever, head, and chest congestion.  Head and chest congested started coughing a few days ago T max 101 Had influenza vaccine  She has been taking Advil Cold and Sinus without relief. Patient does report she has Tussionex left at home but has not used it since her cough only began last night.   History Davianna has a past medical history of Asthma; Thyroid disease; Pneumonia; Diabetes mellitus without complication ([place]); and Allergy.   She has past surgical history that includes Nasal sinus surgery; Breast surgery (01/2013); and Nasal sinus surgery (02/2014).   Her family history includes Cancer in her maternal aunt, maternal grandfather, maternal grandmother, and paternal aunt; Hypertension in her father and mother; Stroke in her maternal grandmother.She reports that she has never smoked. She has never used smokeless tobacco. She reports that she does not drink alcohol or use illicit drugs.  Outpatient Prescriptions Prior to Visit  Medication Sig Dispense Refill  . canagliflozin (INVOKANA) 300 MG TABS tablet Take 300 mg by mouth daily. 30 tablet 2  . fluconazole (DIFLUCAN) 150 MG tablet Take 1 tablet (150 mg total) by mouth once. 1 tablet 1  . fluticasone (FLONASE) 50 MCG/ACT nasal spray Place into both nostrils daily.    . fluticasone (VERAMYST) 27.5 MCG/SPRAY nasal spray Place 2 sprays into the nose daily. 10 g 12  . Fluticasone Furoate-Vilanterol (BREO ELLIPTA) 200-25 MCG/INH AEPB Inhale 1 puff into the lungs daily. 60 each 5  . guaiFENesin (MUCINEX) 600 MG 12 hr tablet Take by mouth 2 (two) times daily.    Marland Kitchen levofloxacin (LEVAQUIN) 750 MG tablet Take  1 tablet (750 mg total) by mouth daily. 5 tablet 0  . levothyroxine (SYNTHROID, LEVOTHROID) 75 MCG tablet Take 75 tablets by mouth daily.  2  . mometasone-formoterol (DULERA) 100-5 MCG/ACT AERO Inhale 2 puffs into the lungs 2 (two) times daily.    . montelukast (SINGULAIR) 10 MG tablet Take 1 tablet (10 mg total) by mouth at bedtime. 30 tablet 1  . pantoprazole (PROTONIX) 40 MG tablet Take 1 tablet (40 mg total) by mouth daily. 30 tablet 1  . Umeclidinium Bromide (INCRUSE ELLIPTA) 62.5 MCG/INH AEPB Inhale 1 puff into the lungs daily. 30 each 5  . chlorpheniramine-HYDROcodone (TUSSIONEX PENNKINETIC ER) 10-8 MG/5ML SUER Take 5 mLs by mouth at bedtime as needed for cough. May cause drowsiness. 120 mL 0  . doxycycline (VIBRA-TABS) 100 MG tablet Take 1 tablet (100 mg total) by mouth 2 (two) times daily. 14 tablet 0  . predniSONE (DELTASONE) 10 MG tablet Take 6 tablets by mouth on day 1 with breakfast then decrease by 1 tablet each day until gone. 21 tablet 0   No facility-administered medications prior to visit.    ROS Review of Systems  Constitutional: Positive for fever, chills and fatigue. Negative for diaphoresis.  HENT: Positive for congestion and trouble swallowing. Negative for ear discharge, ear pain, sinus pressure, sneezing, sore throat and voice change.   Respiratory: Positive for cough. Negative for chest tightness, shortness of breath and wheezing.   Cardiovascular: Negative for chest pain, palpitations and leg swelling.  Gastrointestinal: Negative for nausea, vomiting and  diarrhea.  Skin: Negative for rash.  Neurological: Positive for headaches. Negative for dizziness, weakness and numbness.  Psychiatric/Behavioral: The patient is not nervous/anxious.     Objective:  BP 138/96 mmHg  Pulse 96  Temp(Src) 98.2 F (36.8 C)  Ht 5\' 2"  (1.575 m)  Wt 166 lb (75.297 kg)  BMI 30.35 kg/m2  SpO2 98%  Physical Exam  Constitutional: She is oriented to person, place, and time. She  appears well-developed and well-nourished. No distress.  HENT:  Head: Normocephalic and atraumatic.  Right Ear: External ear normal.  Left Ear: External ear normal.  Mouth/Throat: No oropharyngeal exudate.  Enlarged tonsils, but not edematous TMs clear bilaterally without retraction or bulging, slight scar tissue TM on left at 6:00.  Eyes: EOM are normal. Pupils are equal, round, and reactive to light. Right eye exhibits no discharge. Left eye exhibits no discharge. No scleral icterus.  Cardiovascular: Normal rate, regular rhythm and normal heart sounds.  Exam reveals no gallop and no friction rub.   No murmur heard. Pulmonary/Chest: Effort normal and breath sounds normal. No respiratory distress. She has no wheezes. She has no rales. She exhibits no tenderness.  Neurological: She is alert and oriented to person, place, and time. No cranial nerve deficit. She exhibits normal muscle tone. Coordination normal.  Skin: Skin is warm and dry. No rash noted. She is not diaphoretic.  Psychiatric: She has a normal mood and affect. Her behavior is normal. Judgment and thought content normal.   Assessment & Plan:   Tulani was seen today for sore throat.  Diagnoses and all orders for this visit:  Viral URI with cough -     POCT Influenza A/B  Other orders -     predniSONE (DELTASONE) 10 MG tablet; Take 6 tablets by mouth on day 1 with breakfast then decrease by 1 tablet each day until gone.   I have discontinued Ms. Givhan's doxycycline and chlorpheniramine-HYDROcodone. I am also having her maintain her fluticasone, mometasone-formoterol, guaiFENesin, levothyroxine, canagliflozin, montelukast, pantoprazole, fluticasone, Fluticasone Furoate-Vilanterol, Umeclidinium Bromide, levofloxacin, fluconazole, and predniSONE.  Meds ordered this encounter  Medications  . predniSONE (DELTASONE) 10 MG tablet    Sig: Take 6 tablets by mouth on day 1 with breakfast then decrease by 1 tablet each day until  gone.    Dispense:  21 tablet    Refill:  0    Order Specific Question:  Supervising Provider    Answer:  Crecencio Mc [[id number]]     Follow-up: Return if symptoms worsen or fail to improve.

## 2015-09-29 NOTE — Patient Instructions (Signed)
Prednisone with breakfast or lunch at the latest.  6 tablets on day 1, 5 tablets on day 2, 4 tablets on day 3, 3 tablets on day 4, 2 tablets day 5, 1 tablet on day 6...done! Take tablets all together not spaced out Don't take with NSAIDs (Ibuprofen, Aleve, Naproxen, Meloxicam ect...).   Follow up with me Via MyChart tomorrow or Friday to see if you are improving.

## 2015-10-01 ENCOUNTER — Encounter: Payer: Self-pay | Admitting: Nurse Practitioner

## 2015-10-04 ENCOUNTER — Encounter: Payer: Self-pay | Admitting: Nurse Practitioner

## 2015-10-05 ENCOUNTER — Encounter: Payer: Self-pay | Admitting: Nurse Practitioner

## 2015-10-05 ENCOUNTER — Ambulatory Visit (INDEPENDENT_AMBULATORY_CARE_PROVIDER_SITE_OTHER): Payer: 59 | Admitting: Nurse Practitioner

## 2015-10-05 VITALS — BP 152/98 | HR 79 | Temp 98.1°F | Resp 14 | Ht 63.0 in | Wt 165.5 lb

## 2015-10-05 DIAGNOSIS — J069 Acute upper respiratory infection, unspecified: Secondary | ICD-10-CM | POA: Diagnosis not present

## 2015-10-05 DIAGNOSIS — R509 Fever, unspecified: Secondary | ICD-10-CM | POA: Diagnosis not present

## 2015-10-05 DIAGNOSIS — B9789 Other viral agents as the cause of diseases classified elsewhere: Secondary | ICD-10-CM

## 2015-10-05 DIAGNOSIS — J028 Acute pharyngitis due to other specified organisms: Secondary | ICD-10-CM

## 2015-10-05 NOTE — Progress Notes (Signed)
Pre-visit discussion using our clinic review tool. No additional management support is needed unless otherwise documented below in the visit note.  

## 2015-10-05 NOTE — Progress Notes (Signed)
Patient ID: Bridget Peters, female    DOB: [birth year]  Age: 44 y.o. MRN: [id number]  CC: Acute Visit and Cough   HPI Bridget Peters presents for CC still feeling URI symptoms.   1) Tmax 101-102 since Christmas Yesterday morning had fever  Had Mono in college she reports  She has been seen multiple times by myself and E-visits for sinusitis. She had amoxicillin and Levaquin from those visit and a prednisone taper from our last visit. She is taking flonase, mucinex, singulair ect...   She is trying to rest. Feels better then relapses and feels worse. Her throat feels improved today, but changes each day.  Able to swallow solids and liquids without difficulty, denies trouble breathing.  History Bridget Peters has a past medical history of Asthma; Thyroid disease; Pneumonia; Diabetes mellitus without complication ([place]); and Allergy.   She has past surgical history that includes Nasal sinus surgery; Breast surgery (01/2013); and Nasal sinus surgery (02/2014).   Her family history includes Cancer in her maternal aunt, maternal grandfather, maternal grandmother, and paternal aunt; Hypertension in her father and mother; Stroke in her maternal grandmother.She reports that she has never smoked. She has never used smokeless tobacco. She reports that she does not drink alcohol or use illicit drugs.  Outpatient Prescriptions Prior to Visit  Medication Sig Dispense Refill  . canagliflozin (INVOKANA) 300 MG TABS tablet Take 300 mg by mouth daily. 30 tablet 2  . fluticasone (FLONASE) 50 MCG/ACT nasal spray Place into both nostrils daily.    . fluticasone (VERAMYST) 27.5 MCG/SPRAY nasal spray Place 2 sprays into the nose daily. 10 g 12  . Fluticasone Furoate-Vilanterol (BREO ELLIPTA) 200-25 MCG/INH AEPB Inhale 1 puff into the lungs daily. 60 each 5  . levothyroxine (SYNTHROID, LEVOTHROID) 75 MCG tablet Take 75 tablets by mouth daily.  2  . mometasone-formoterol (DULERA) 100-5 MCG/ACT AERO Inhale 2 puffs into  the lungs 2 (two) times daily.    . montelukast (SINGULAIR) 10 MG tablet Take 1 tablet (10 mg total) by mouth at bedtime. 30 tablet 1  . pantoprazole (PROTONIX) 40 MG tablet Take 1 tablet (40 mg total) by mouth daily. 30 tablet 1  . predniSONE (DELTASONE) 10 MG tablet Take 6 tablets by mouth on day 1 with breakfast then decrease by 1 tablet each day until gone. 21 tablet 0  . Umeclidinium Bromide (INCRUSE ELLIPTA) 62.5 MCG/INH AEPB Inhale 1 puff into the lungs daily. 30 each 5  . guaiFENesin (MUCINEX) 600 MG 12 hr tablet Take by mouth 2 (two) times daily. Reported on 10/05/2015    . fluconazole (DIFLUCAN) 150 MG tablet Take 1 tablet (150 mg total) by mouth once. (Patient not taking: Reported on 10/05/2015) 1 tablet 1  . levofloxacin (LEVAQUIN) 750 MG tablet Take 1 tablet (750 mg total) by mouth daily. (Patient not taking: Reported on 10/05/2015) 5 tablet 0   No facility-administered medications prior to visit.    ROS Review of Systems  Constitutional: Positive for fatigue. Negative for fever, chills and diaphoresis.  HENT: Positive for postnasal drip, rhinorrhea and sore throat. Negative for trouble swallowing.   Respiratory: Negative for chest tightness, shortness of breath and wheezing.   Cardiovascular: Negative for chest pain, palpitations and leg swelling.   Gastrointestinal: Negative for nausea, vomiting and diarrhea.  Skin: Positive for rash.       Side of face  Neurological: Negative for dizziness, weakness, numbness and headaches.  Psychiatric/Behavioral: The patient is not nervous/anxious.     Objective:  BP 152/98  mmHg  Pulse 79  Temp(Src) 98.1 F (36.7 C) (Oral)  Resp 14  Ht 5' 3"  (1.6 m)  Wt 165 lb 8 oz (75.07 kg)  BMI 29.32 kg/m2  SpO2 98%  Physical Exam  Constitutional: She is oriented to person, place, and time. She appears well-developed and well-nourished. No distress.  HENT:  Head: Normocephalic and atraumatic.  Right Ear: External ear normal.  Left Ear:  External ear normal.  Mouth/Throat: No oropharyngeal exudate.  Tonsils 3+ today, improved slightly from last visit  Eyes: EOM are normal. Pupils are equal, round, and reactive to light. Right eye exhibits no discharge. Left eye exhibits no discharge. No scleral icterus.  Neck: Normal range of motion. Neck supple.  Cardiovascular: Normal rate, regular rhythm and normal heart sounds.  Exam reveals no gallop and no friction rub.   No murmur heard. Pulmonary/Chest: Effort normal and breath sounds normal. No respiratory distress. She has no wheezes. She has no rales. She exhibits no tenderness.  Lymphadenopathy:    She has no cervical adenopathy.  Neurological: She is alert and oriented to person, place, and time. No cranial nerve deficit. She exhibits normal muscle tone. Coordination normal.  Skin: Skin is warm and dry. Rash noted. She is not diaphoretic.  Rash on face  Psychiatric: She has a normal mood and affect. Her behavior is normal. Judgment and thought content normal.   Assessment & Plan:   Bridget Peters was seen today for acute visit and cough.  Diagnoses and all orders for this visit:  Fever, unspecified -     Monospot -     CBC with Differential/Platelet -     Comp Met (CMET)  Acute pharyngitis due to other specified organisms -     Monospot -     CBC with Differential/Platelet -     Comp Met (CMET)   I am having Ms. Engelbrecht maintain her fluticasone, mometasone-formoterol, guaiFENesin, levothyroxine, canagliflozin, montelukast, pantoprazole, fluticasone, Fluticasone Furoate-Vilanterol, Umeclidinium Bromide, and predniSONE.  No orders of the defined types were placed in this encounter.     Follow-up: No Follow-up on file.

## 2015-10-05 NOTE — Patient Instructions (Addendum)
Infectious Mononucleosis °Infectious mononucleosis is an infection caused by a virus. This illness is often called "mono." It causes symptoms that affect various areas of the body, including the throat, upper air passages, and lymph glands. The liver or spleen may also be affected. °The virus spreads from person to person through close contact. The illness is usually not serious and often goes away in 2-4 weeks without treatment. In rare cases, symptoms can be more severe and last longer, sometimes up to several months. Because the illness can sometimes cause the liver or spleen to become enlarged, you should not participate in contact sports or strenuous exercise until your health care provider approves. °CAUSES  °Infectious mononucleosis is caused by the Epstein-Barr virus. This virus spreads through contact with an infected person's saliva or other bodily fluids. It is often spread through kissing. It may also spread through coughing or sharing utensils or drinking glasses that were recently used by an infected person. An infected person will not always appear ill but can still spread the virus. °RISK FACTORS °This illness is most common in adolescents and young adults. °SIGNS AND SYMPTOMS  °The most common symptoms of infectious mononucleosis are: °· Sore throat.   °· Headache.   °· Fatigue.   °· Muscle aches.   °· Swollen glands.   °· Fever.   °· Poor appetite.   °· Enlarged liver or spleen.   °Some less common symptoms that can also occur include: °· Rash. This is more common if you take antibiotic medicines. °· Feeling sick to your stomach (nauseous).   °· Abdominal pain.   °DIAGNOSIS  °Your health care provider will take your medical history and do a physical exam. Blood tests can be done to confirm the diagnosis.  °TREATMENT  °Infectious mononucleosis usually goes away on its own with time. It cannot be cured with medicines, but medicines are sometimes used to relieve symptoms. Steroid medicine is sometimes  needed if the swelling in the throat causes breathing or swallowing problems. Treatment in a hospital is sometimes needed for severe cases.  °HOME CARE INSTRUCTIONS  °· Rest as needed.   °· Do not participate in contact sports, strenuous exercise, or heavy lifting until your health care provider approves. The liver and spleen could be seriously injured if they are enlarged from the illness. You may need to wait a couple months before participating in sports.   °· Drink enough fluid to keep your urine clear or pale yellow.   °· Do not drink alcohol. °· Take medicines only as directed by your health care provider. Children under 18 years of age should not take aspirin because of the association with Reye syndrome.   °· Eat soft foods. Cold foods such as ice cream or frozen ice pops can soothe a sore throat. °· If you have a sore throat, gargle with a mixture of salt and water. This may help relieve your discomfort. Mix 1 tsp of salt in 1 cup of warm water. Sucking on hard candy may also help.   °· Start regular activities gradually after the fever is gone. Be sure to rest when tired.   °· Avoid kissing or sharing utensils or drinking glasses until your health care provider tells you that you are no longer contagious.   °PREVENTION  °To avoid spreading the virus, do not kiss anyone or share utensils, drinking glasses, or food until your health care provider tells you that you are no longer contagious. °SEEK MEDICAL CARE IF:  °· Your fever is not gone after 10 days. °· You have swollen lymph nodes that are not   back to normal after 4 weeks. °· Your activity level is not back to normal after 2 months.   °· You have yellow coloring to your eyes and skin (jaundice). °· You have constipation.   °SEEK IMMEDIATE MEDICAL CARE IF:  °· You have severe pain in the abdomen or shoulder. °· You are drooling. °· You have trouble swallowing. °· You have trouble breathing. °· You develop a stiff neck. °· You develop a severe  headache. °· You cannot stop throwing up (vomiting). °· You have convulsions. °· You are confused. °· You have trouble with balance. °· You have signs of dehydration. These may include: °¨ Weakness. °¨ Sunken eyes. °¨ Pale skin. °¨ Dry mouth. °¨ Rapid breathing or pulse. °  °This information is not intended to replace advice given to you by your health care provider. Make sure you discuss any questions you have with your health care provider. °  °Document Released: 09/22/2000 Document Revised: 10/16/2014 Document Reviewed: 06/02/2014 °Elsevier Interactive Patient Education ©2016 [employer] ° °

## 2015-10-06 ENCOUNTER — Telehealth: Payer: Self-pay | Admitting: Nurse Practitioner

## 2015-10-06 ENCOUNTER — Other Ambulatory Visit: Payer: Self-pay | Admitting: Nurse Practitioner

## 2015-10-06 ENCOUNTER — Encounter: Payer: Self-pay | Admitting: Nurse Practitioner

## 2015-10-06 ENCOUNTER — Telehealth: Payer: Self-pay | Admitting: *Deleted

## 2015-10-06 DIAGNOSIS — J359 Chronic disease of tonsils and adenoids, unspecified: Secondary | ICD-10-CM

## 2015-10-06 LAB — COMPREHENSIVE METABOLIC PANEL
ALK PHOS: 91 U/L (ref 39–117)
ALT: 15 U/L (ref 0–35)
AST: 14 U/L (ref 0–37)
Albumin: 4.6 g/dL (ref 3.5–5.2)
BILIRUBIN TOTAL: 0.4 mg/dL (ref 0.2–1.2)
BUN: 18 mg/dL (ref 6–23)
CO2: 25 mEq/L (ref 19–32)
CREATININE: 1.18 mg/dL (ref 0.40–1.20)
Calcium: 10.1 mg/dL (ref 8.4–10.5)
Chloride: 98 mEq/L (ref 96–112)
GFR: 52.71 mL/min — ABNORMAL LOW (ref >60.00)
GLUCOSE: 141 mg/dL — AB (ref 70–99)
Potassium: 4.2 mEq/L (ref 3.5–5.1)
SODIUM: 137 meq/L (ref 135–145)
TOTAL PROTEIN: 7.6 g/dL (ref 6.0–8.3)

## 2015-10-06 LAB — CBC WITH DIFFERENTIAL/PLATELET
BASOS ABS: 0 10*3/uL (ref 0.0–0.1)
Basophils Relative: 0.3 % (ref 0.0–3.0)
Eosinophils Absolute: 0.2 10*3/uL (ref 0.0–0.7)
Eosinophils Relative: 2.4 % (ref 0.0–5.0)
HCT: 45.2 % (ref 36.0–46.0)
Hemoglobin: 15.4 g/dL — ABNORMAL HIGH (ref 12.0–15.0)
LYMPHS ABS: 2.5 10*3/uL (ref 0.7–4.0)
Lymphocytes Relative: 26.4 % (ref 12.0–46.0)
MCHC: 34.1 g/dL (ref 30.0–36.0)
MCV: 85.1 fl (ref 78.0–100.0)
MONO ABS: 0.8 10*3/uL (ref 0.1–1.0)
Monocytes Relative: 8.5 % (ref 3.0–12.0)
NEUTROS PCT: 62.4 % (ref 43.0–77.0)
Neutro Abs: 5.9 10*3/uL (ref 1.4–7.7)
Platelets: 355 10*3/uL (ref 150.0–400.0)
RBC: 5.31 Mil/uL — AB (ref 3.87–5.11)
RDW: 13.4 % (ref 11.5–15.5)
WBC: 9.4 10*3/uL (ref 4.0–10.5)

## 2015-10-06 LAB — MONONUCLEOSIS SCREEN: MONO SCREEN: NEGATIVE

## 2015-10-06 MED ORDER — DOXYCYCLINE HYCLATE 100 MG PO TABS: 100.0000 mg | ORAL_TABLET | Freq: Two times a day (BID) | ORAL | Status: DC

## 2015-10-06 NOTE — Telephone Encounter (Signed)
I'll send in doxy. Just sent her an e-mail about ENT referral I placed today.

## 2015-10-06 NOTE — Telephone Encounter (Signed)
Left her a detailed message that she didn't need her appointment for tomorrow and that a prescription was sent in and a Referral to an ENT.

## 2015-10-06 NOTE — Telephone Encounter (Signed)
Tried to call and see where she wants doxy to go. Calling in to [facility] to be picked up tomorrow

## 2015-10-06 NOTE — Telephone Encounter (Signed)
Spoke with the patient, she was very concerned this morning as the mono test came back negative.  She wanted to know if she needs to keep the appointment, or can a prescription be called in for Doxy? Thanks

## 2015-10-06 NOTE — Assessment & Plan Note (Signed)
See previous note as well. Pt was given depo-medrol 40 IM in office and taper with some relief. 2 rounds of abx w/out relief. Pt is staying in touch via MyChart and each days seems different. Will obtain CBC w. Diff, CMEt, and Monospot due to rash after amoxicillin, pharyngitis, and severe fatigue. Will follow after results.

## 2015-10-06 NOTE — Telephone Encounter (Signed)
Attempted to call patient back, left a message

## 2015-10-06 NOTE — Telephone Encounter (Signed)
Patient requested a call back in concerns with having problems with swollen tonsils. She's running a fever, with cough. Patient requested a call today, to discuss the concerns. She has been scheduled for an appt 10/07/15. Please advise

## 2015-10-07 ENCOUNTER — Ambulatory Visit: Payer: 59 | Admitting: Nurse Practitioner

## 2015-10-15 ENCOUNTER — Other Ambulatory Visit: Payer: Self-pay | Admitting: Internal Medicine

## 2015-10-15 ENCOUNTER — Encounter: Payer: Self-pay | Admitting: Nurse Practitioner

## 2015-10-15 ENCOUNTER — Other Ambulatory Visit: Payer: Self-pay

## 2015-10-15 DIAGNOSIS — J329 Chronic sinusitis, unspecified: Secondary | ICD-10-CM | POA: Diagnosis not present

## 2015-10-15 MED ORDER — LEVOTHYROXINE SODIUM 75 MCG PO TABS: 5625.0000 ug | ORAL_TABLET | Freq: Every day | ORAL | Status: DC

## 2015-10-29 ENCOUNTER — Other Ambulatory Visit: Payer: Self-pay | Admitting: Nurse Practitioner

## 2015-10-29 DIAGNOSIS — E119 Type 2 diabetes mellitus without complications: Secondary | ICD-10-CM

## 2015-11-09 ENCOUNTER — Ambulatory Visit (INDEPENDENT_AMBULATORY_CARE_PROVIDER_SITE_OTHER): Payer: 59 | Admitting: Internal Medicine

## 2015-11-09 ENCOUNTER — Encounter: Payer: Self-pay | Admitting: Internal Medicine

## 2015-11-09 VITALS — BP 124/62 | HR 98 | Ht 62.0 in | Wt 164.4 lb

## 2015-11-09 DIAGNOSIS — J4520 Mild intermittent asthma, uncomplicated: Secondary | ICD-10-CM | POA: Diagnosis not present

## 2015-11-09 NOTE — Progress Notes (Signed)
[facility] Consultation      Date: 11/09/2015,   MRN# [id number] Bridget Peters 11/17/1970 Code Status:  Hosp day:@LENGTHOFSTAYDAYS @ Referring MD: @ATDPROV @     PCP:      AdmissionWeight: 164 lb 6.4 oz (74.571 kg)                 CurrentWeight: 164 lb 6.4 oz (74.571 kg) Bridget Peters is a 45 y.o. old female seen in consultation for asthma     CHIEF COMPLAINT:   Follow up [place]  Patient doing well with her asthma, no exacerbations since last visit No cough, no wheezing, no SOB Not taking BREo but has been doing really well with incruse Patient has had chronic sinus infection since last 2 months On ABX, patient with h/o sinus surgery, will follow up with ENT next week Used labuterol once in last 2 weeks       MEDICATIONS    Home Medication:  Current Outpatient Rx  Name  Route  Sig  Dispense  Refill  . fluconazole (DIFLUCAN) 150 MG tablet            1   . fluticasone (VERAMYST) 27.5 MCG/SPRAY nasal spray   Nasal   Place 2 sprays into the nose daily.   10 g   12   . Fluticasone Furoate-Vilanterol (BREO ELLIPTA) 200-25 MCG/INH AEPB   Inhalation   Inhale 1 puff into the lungs daily.   60 each   5   . INVOKANA 300 MG TABS tablet      TAKE 1 TABLET BY MOUTH ONCE DAILY   30 tablet   2   . levocetirizine (XYZAL) 5 MG tablet            3   . levothyroxine (SYNTHROID, LEVOTHROID) 75 MCG tablet   Oral   Take 75 tablets (5,625 mcg total) by mouth daily.   30 tablet   2   . montelukast (SINGULAIR) 10 MG tablet   Oral   Take 1 tablet (10 mg total) by mouth at bedtime.   30 tablet   1   . pantoprazole (PROTONIX) 40 MG tablet      TAKE 1 TABLET BY MOUTH DAILY.   30 tablet   1   . Umeclidinium Bromide (INCRUSE ELLIPTA) 62.5 MCG/INH AEPB   Inhalation   Inhale 1 puff into the lungs daily.   30 each   5   . guaiFENesin (MUCINEX) 600 MG 12 hr tablet   Oral   Take by mouth 2 (two) times  daily. Reported on 11/09/2015           Current Medication:  Current outpatient prescriptions:  .  fluconazole (DIFLUCAN) 150 MG tablet, , Disp: , Rfl: 1 .  fluticasone (VERAMYST) 27.5 MCG/SPRAY nasal spray, Place 2 sprays into the nose daily., Disp: 10 g, Rfl: 12 .  Fluticasone Furoate-Vilanterol (BREO ELLIPTA) 200-25 MCG/INH AEPB, Inhale 1 puff into the lungs daily., Disp: 60 each, Rfl: 5 .  INVOKANA 300 MG TABS tablet, TAKE 1 TABLET BY MOUTH ONCE DAILY, Disp: 30 tablet, Rfl: 2 .  levocetirizine (XYZAL) 5 MG tablet, , Disp: , Rfl: 3 .  levothyroxine (SYNTHROID, LEVOTHROID) 75 MCG tablet, Take 75 tablets (5,625 mcg total) by mouth daily., Disp: 30 tablet, Rfl: 2 .  montelukast (SINGULAIR) 10 MG tablet, Take 1 tablet (10 mg total) by mouth at bedtime., Disp: 30 tablet, Rfl: 1 .  pantoprazole (PROTONIX) 40 MG tablet, TAKE  1 TABLET BY MOUTH DAILY., Disp: 30 tablet, Rfl: 1 .  Umeclidinium Bromide (INCRUSE ELLIPTA) 62.5 MCG/INH AEPB, Inhale 1 puff into the lungs daily., Disp: 30 each, Rfl: 5 .  guaiFENesin (MUCINEX) 600 MG 12 hr tablet, Take by mouth 2 (two) times daily. Reported on 11/09/2015, Disp: , Rfl:     ALLERGIES   Shellfish allergy     REVIEW OF SYSTEMS   Review of Systems  Constitutional: Negative for fever, chills and weight loss.  HENT: Positive for congestion.   Respiratory: Negative for cough, hemoptysis, sputum production and wheezing.   Cardiovascular: Negative for chest pain.  Endo/Heme/Allergies: Negative.   All other systems reviewed and are negative.    VS: BP 124/62 mmHg  Pulse 98  Ht 5\' 2"  (1.575 m)  Wt 164 lb 6.4 oz (74.571 kg)  BMI 30.06 kg/m2  SpO2 98%     PHYSICAL EXAM  Physical Exam  Constitutional: She is oriented to person, place, and time. She appears well-developed and well-nourished. No distress.  HENT:  Head: Normocephalic and atraumatic.  Mouth/Throat: No oropharyngeal exudate.  +sinus tenderness  Eyes: EOM are normal. Pupils are  equal, round, and reactive to light.  Cardiovascular: Normal rate, regular rhythm and normal heart sounds.   No murmur heard. Pulmonary/Chest: No respiratory distress. She has no wheezes.  Musculoskeletal: Normal range of motion.  Neurological: She is alert and oriented to person, place, and time.  Skin: Skin is warm. She is not diaphoretic.  Psychiatric: She has a normal mood and affect.         ASSESSMENT/PLAN   45 yo white female seen today for chronic persistent asthma without exacerbation at this time in the setting of chronic sinus infection. Doing well with Incruse only   Plan:  1.avoid all triggers 2.continue incruse 3.albuterol as needed 4.continue PPI 5.continue veramyst 6.continue singulair/zyrtec 7.netti pot if it helps 8.follow up ENT next week  Follow up in 3 months   The Patient requires high complexity decision making for assessment and support, frequent evaluation and titration of therapies, application of advanced monitoring technologies and extensive interpretation of multiple databases.  Patient satisfied with Plan of action and management. All questions answered  [name], M.D.  [facility]  Medical Director [place] Director [facility] Cardio-Pulmonary Department

## 2015-11-09 NOTE — Patient Instructions (Signed)
Asthma Attack Prevention While you may not be able to control the fact that you have asthma, you can take actions to prevent asthma attacks. The best way to prevent asthma attacks is to maintain good control of your asthma. You can achieve this by:  Taking your medicines as directed.  Avoiding things that can irritate your airways or make your asthma symptoms worse (asthma triggers).  Keeping track of how well your asthma is controlled and of any changes in your symptoms.  Responding quickly to worsening asthma symptoms (asthma attack).  Seeking emergency care when it is needed. WHAT ARE SOME WAYS TO PREVENT AN ASTHMA ATTACK? Have a Plan Work with your health care provider to create a written plan for managing and treating your asthma attacks (asthma action plan). This plan includes:  A list of your asthma triggers and how you can avoid them.  Information on when medicines should be taken and when their dosages should be changed.  The use of a device that measures how well your lungs are working (peak flow meter). Monitor Your Asthma Use your peak flow meter and record your results in a journal every day. A drop in your peak flow numbers on one or more days may indicate the start of an asthma attack. This can happen even before you start to feel symptoms. You can prevent an asthma attack from getting worse by following the steps in your asthma action plan. Avoid Asthma Triggers Work with your asthma health care provider to find out what your asthma triggers are. This can be done by:  Allergy testing.  Keeping a journal that notes when asthma attacks occur and the factors that may have contributed to them.  Determining if there are other medical conditions that are making your asthma worse. Once you have determined your asthma triggers, take steps to avoid them. This may include avoiding excessive or prolonged exposure to:  Dust. Have someone dust and vacuum your home for you once or  twice a week. Using a high-efficiency particulate arrestance (HEPA) vacuum is best.  Smoke. This includes campfire smoke, forest fire smoke, and secondhand smoke from tobacco products.  Pet dander. Avoid contact with animals that you know you are allergic to.  Allergens from trees, grasses or pollens. Avoid spending a lot of time outdoors when pollen counts are high, and on very windy days.  Very cold, dry, or humid air.  Mold.  Foods that contain high amounts of sulfites.  Strong odors.  Outdoor air pollutants, such as engine exhaust.  Indoor air pollutants, such as aerosol sprays and fumes from household cleaners.  Household pests, including dust mites and cockroaches, and pest droppings.  Certain medicines, including NSAIDs. Always talk to your health care provider before stopping or starting any new medicines. Medicines Take over-the-counter and prescription medicines only as told by your health care provider. Many asthma attacks can be prevented by carefully following your medicine schedule. Taking your medicines correctly is especially important when you cannot avoid certain asthma triggers. Act Quickly If an asthma attack does happen, acting quickly can decrease how severe it is and how long it lasts. Take these steps:   Pay attention to your symptoms. If you are coughing, wheezing, or having difficulty breathing, do not wait to see if your symptoms go away on their own. Follow your asthma action plan.  If you have followed your asthma action plan and your symptoms are not improving, call your health care provider or seek immediate medical care   at the nearest hospital. It is important to note how often you need to use your fast-acting rescue inhaler. If you are using your rescue inhaler more often, it may mean that your asthma is not under control. Adjusting your asthma treatment plan may help you to prevent future asthma attacks and help you to gain better control of your  condition. HOW CAN I PREVENT AN ASTHMA ATTACK WHEN I EXERCISE? Follow advice from your health care provider about whether you should use your fast-acting inhaler before exercising. Many people with asthma experience exercise-induced bronchoconstriction (EIB). This condition often worsens during vigorous exercise in cold, humid, or dry environments. Usually, people with EIB can stay very active by pre-treating with a fast-acting inhaler before exercising.   This information is not intended to replace advice given to you by your health care provider. Make sure you discuss any questions you have with your health care provider.   Document Released: 09/13/2009 Document Revised: 06/16/2015 Document Reviewed: 02/25/2015 Elsevier Interactive Patient Education 2016 [employer]  

## 2015-11-17 ENCOUNTER — Other Ambulatory Visit: Payer: Self-pay | Admitting: Otolaryngology

## 2015-11-17 DIAGNOSIS — J324 Chronic pansinusitis: Secondary | ICD-10-CM

## 2015-11-17 DIAGNOSIS — J329 Chronic sinusitis, unspecified: Secondary | ICD-10-CM | POA: Diagnosis not present

## 2015-11-24 ENCOUNTER — Ambulatory Visit: Payer: 59

## 2015-11-26 ENCOUNTER — Ambulatory Visit
Admission: RE | Admit: 2015-11-26 | Discharge: 2015-11-26 | Disposition: A | Discharge status: Home / Self Care | Payer: 59 | Source: Ambulatory Visit | Attending: Otolaryngology | Admitting: Otolaryngology

## 2015-11-26 DIAGNOSIS — J341 Cyst and mucocele of nose and nasal sinus: Secondary | ICD-10-CM | POA: Diagnosis not present

## 2015-11-26 DIAGNOSIS — J0100 Acute maxillary sinusitis, unspecified: Secondary | ICD-10-CM | POA: Diagnosis not present

## 2015-11-26 DIAGNOSIS — J324 Chronic pansinusitis: Secondary | ICD-10-CM | POA: Insufficient documentation

## 2015-12-06 ENCOUNTER — Telehealth: Payer: Self-pay | Admitting: Internal Medicine

## 2015-12-06 NOTE — Telephone Encounter (Signed)
Pt scheduled to see DS tomorrow at 11am. Nothing further needed.

## 2015-12-06 NOTE — Telephone Encounter (Signed)
Patient has some wheezing that started yesterday c/o cough and tiredness.   Patient wants to see Bridget Peters and see if she can treat before she gets worse.   Next available with Bridget Peters is 03-31 and patient does not want to see another provider.  Please call to discuss.

## 2015-12-06 NOTE — Telephone Encounter (Signed)
LM on Vm for pt to call back. Can put pt in to see DS tomorrow.

## 2015-12-07 ENCOUNTER — Ambulatory Visit (INDEPENDENT_AMBULATORY_CARE_PROVIDER_SITE_OTHER): Payer: 59 | Admitting: Pulmonary Disease

## 2015-12-07 ENCOUNTER — Encounter: Payer: Self-pay | Admitting: Pulmonary Disease

## 2015-12-07 VITALS — BP 132/76 | HR 84 | Ht 62.0 in | Wt 164.0 lb

## 2015-12-07 DIAGNOSIS — J4541 Moderate persistent asthma with (acute) exacerbation: Secondary | ICD-10-CM | POA: Diagnosis not present

## 2015-12-07 MED ORDER — FLUTICASONE FUROATE-VILANTEROL 100-25 MCG/INH IN AEPB: 1.0000 | INHALATION_SPRAY | Freq: Every day | RESPIRATORY_TRACT | Status: AC

## 2015-12-07 MED ORDER — ALBUTEROL SULFATE (2.5 MG/3ML) 0.083% IN NEBU: 2.5000 mg | INHALATION_SOLUTION | Freq: Four times a day (QID) | RESPIRATORY_TRACT | Status: DC | PRN

## 2015-12-07 MED ORDER — PREDNISONE 20 MG PO TABS: 40.0000 mg | ORAL_TABLET | Freq: Every day | ORAL | Status: AC

## 2015-12-07 MED ORDER — FLUTICASONE FUROATE-VILANTEROL 100-25 MCG/INH IN AEPB: 1.0000 | INHALATION_SPRAY | Freq: Every day | RESPIRATORY_TRACT | Status: DC

## 2015-12-07 MED ORDER — FLUTICASONE FUROATE-VILANTEROL 200-25 MCG/INH IN AEPB: 1.0000 | INHALATION_SPRAY | Freq: Every day | RESPIRATORY_TRACT | Status: DC

## 2015-12-07 NOTE — Patient Instructions (Signed)
Stop Incruse Begin Breo - sample provided and rx sent Prednisone 40 mg daily X 5 days Continue Zyrtec and Singulair as prescribed by Dr Donneta Romberg

## 2015-12-08 NOTE — Progress Notes (Signed)
ACUTE PULMONARY OFFICE VISIT  ACUTE PROBLEM: Acute dyspnea, cough  CHRONIC PULMONARY PROBLEMS: Extrinsic asthma - followed by Dr Mortimer Fries  SUBJ: Presents with 2 days of SOB, cough typical of her asthma symptoms. No fever, purulent sputum, hemoptysis, CP, LE edema or calf tenderness  OBJ: Filed Vitals:   12/07/15 1120  BP: 132/76  Pulse: 84  Height: 5\' 2"  (1.575 m)  Weight: 164 lb (74.39 kg)  SpO2: 97%    Gen: WDWN in NAD HEENT: All WNL Neck: NO LAN, no JVD noted Lungs: coarse BS, no true wheezes, no other adventitious sounds Cardiovascular: Reg rate, normal rhythm, no M noted Abdomen: Soft, NT +BS Ext: no C/C/E Neuro: CNs intact, motor/sens grossly intact   IMPRESSION: Extrinsic asthma - seasonal allergic. She was previously asymptomatic on Incruse so this was not changed by Dr Mortimer Fries. However, her symptoms tend to be worse during pollen seasons and we are likely seeing a manifestation of that now. She would likely do better on an inhaled steroid/LABA controller  PLAN:   Stop Incruse Begin Breo - sample provided and rx sent Continue PRN albuterol Prednisone 40 mg daily X 5 days Continue Zyrtec and Singulair as prescribed by Dr Venida Jarvis is already scheduled with Dr Mortimer Fries in March  I have reviewed this patient's medical problems, current medications and therapies and prior pulmonary office notes in evaluation and formulation of the above assessment and plan    Wilhelmina Mcardle, MD [facility]

## 2015-12-27 ENCOUNTER — Other Ambulatory Visit: Payer: Self-pay | Admitting: Internal Medicine

## 2016-02-02 ENCOUNTER — Other Ambulatory Visit: Payer: Self-pay | Admitting: Nurse Practitioner

## 2016-02-03 ENCOUNTER — Ambulatory Visit: Payer: 59 | Admitting: Internal Medicine

## 2016-02-07 ENCOUNTER — Other Ambulatory Visit: Payer: Self-pay | Admitting: Nurse Practitioner

## 2016-03-27 ENCOUNTER — Ambulatory Visit
Admission: RE | Admit: 2016-03-27 | Discharge: 2016-03-27 | Disposition: A | Discharge status: Home / Self Care | Payer: 59 | Source: Ambulatory Visit | Attending: Internal Medicine | Admitting: Internal Medicine

## 2016-03-27 ENCOUNTER — Telehealth: Payer: Self-pay | Admitting: Internal Medicine

## 2016-03-27 DIAGNOSIS — R05 Cough: Secondary | ICD-10-CM | POA: Diagnosis not present

## 2016-03-27 DIAGNOSIS — R0600 Dyspnea, unspecified: Secondary | ICD-10-CM

## 2016-03-27 NOTE — Telephone Encounter (Signed)
Pt informed of response. appt scheduled and cxr order placed. Nothing further needed.

## 2016-03-27 NOTE — Telephone Encounter (Signed)
Per DR have pt get CXR and do breathing treatments and schedule to come in tomorrow am.  LMOVM for pt to return call.

## 2016-03-27 NOTE — Telephone Encounter (Signed)
Pt states she feels like there is "a band around my chest". States she started feeling this week ago, but this weekend it has gotten worse. States she took 3 breathing treatments yesterday. Please call.

## 2016-03-27 NOTE — Telephone Encounter (Signed)
Please have pt come in for eval.

## 2016-03-27 NOTE — Telephone Encounter (Signed)
Wheezing, cough, chest tightness, SOB. Done 3 breathing treatments yesterday and has brought machine to work today in case she needs them. Please advise. Had Prednisone last in January 2017, taper pack, and last ABX was in Dec 2016.

## 2016-03-28 ENCOUNTER — Other Ambulatory Visit
Admission: RE | Admit: 2016-03-28 | Discharge: 2016-03-28 | Disposition: A | Discharge status: Home / Self Care | Payer: 59 | Source: Ambulatory Visit | Attending: Internal Medicine | Admitting: Internal Medicine

## 2016-03-28 ENCOUNTER — Ambulatory Visit (INDEPENDENT_AMBULATORY_CARE_PROVIDER_SITE_OTHER): Payer: 59 | Admitting: Internal Medicine

## 2016-03-28 ENCOUNTER — Encounter: Payer: Self-pay | Admitting: Internal Medicine

## 2016-03-28 VITALS — BP 130/72 | HR 110 | Ht 62.0 in | Wt 164.0 lb

## 2016-03-28 DIAGNOSIS — J4541 Moderate persistent asthma with (acute) exacerbation: Secondary | ICD-10-CM

## 2016-03-28 DIAGNOSIS — J45909 Unspecified asthma, uncomplicated: Secondary | ICD-10-CM | POA: Diagnosis not present

## 2016-03-28 LAB — CBC WITH DIFFERENTIAL/PLATELET
BASOS ABS: 0 10*3/uL (ref 0–0.1)
BASOS PCT: 0 %
Eosinophils Absolute: 0.2 10*3/uL (ref 0–0.7)
Eosinophils Relative: 4 %
HEMATOCRIT: 44.3 % (ref 35.0–47.0)
HEMOGLOBIN: 15.7 g/dL (ref 12.0–16.0)
Lymphocytes Relative: 37 %
Lymphs Abs: 2.2 10*3/uL (ref 1.0–3.6)
MCH: 28.8 pg (ref 26.0–34.0)
MCHC: 35.4 g/dL (ref 32.0–36.0)
MCV: 81.5 fL (ref 80.0–100.0)
MONOS PCT: 8 %
Monocytes Absolute: 0.5 10*3/uL (ref 0.2–0.9)
NEUTROS ABS: 3.1 10*3/uL (ref 1.4–6.5)
NEUTROS PCT: 51 %
Platelets: 247 10*3/uL (ref 150–440)
RBC: 5.43 MIL/uL — ABNORMAL HIGH (ref 3.80–5.20)
RDW: 13.2 % (ref 11.5–14.5)
WBC: 6 10*3/uL (ref 3.6–11.0)

## 2016-03-28 MED ORDER — PREDNISONE 10 MG PO TABS: ORAL_TABLET | ORAL | Status: DC

## 2016-03-28 NOTE — Progress Notes (Addendum)
* [facility]     Assessment and Plan:  ADDENDUM: Pt's IgE and eos elevated.-- May be a candidate for Nucala; or xolair (would need further allergy testing).   Asthma exacerbation -Patient appears to be wheezing today, with acute exacerbation of asthma. She'll be given a course of prednisone with a slow taper over 12 days. -She was given a work note for today and tomorrow. -We will discuss possibility of going to the emergency room versus trying oral steroids, she would prefer to try the steroids first, I explained that these take pressure 24 hours to give her some relief, if she is not feeling better in 24 hours, he should go to the emergency room. -Given her multiple exacerbations this year, and possible bronchiectasis seen on chest x-ray, will check an IgE level and CBC with differential, see if patient may be a candidate for Xolair or mepoluzimab therapy.  Chronic bronchitis. -Patient has chronic bronchitic changes on chest x-ray, may benefit from LAMA.   Date: 03/28/2016  MRN# [id number] Bridget Peters Jan 03, 1971   Bridget Peters is a 45 y.o. old female seen in follow up for chief complaint of  Chief Complaint  Patient presents with  . Acute Visit    pt c/o increase sob w/exertion, chest tightness, non prod cough, wheezing, chills X1wk      HPI:   The patient is a 45 year old female, in reviewing her chart, he appears to have a  a history of asthma, seen here in our office by Dr. Mortimer Fries in the past. She is been maintained on Breo, Singulair, Zyrtec. She was last seen here in the office. She was last seen 4 months ago in February with a asthma exacerbation, that time she was treated with a course of prednisone. That was her first exacerbation this year. She presents today as an acute visit with shortness of breath, cough, wheezing and chest tightness. She has been using her nebulizer 3-4 times per day. She notes that she has about an hour improvement after  using it.  She has been using breo once daily, has not noticed much difference.  She has no pets at home, she is not a smoker.   Review today of chest x-ray images from 6/19: Showed no acute infiltrate, there were changes of chronic bronchitis, and some areas of bronchiectasis in the mid zones.  Medication:   Outpatient Encounter Prescriptions as of 03/28/2016  Medication Sig  . albuterol (PROVENTIL) (2.5 MG/3ML) 0.083% nebulizer solution Take 3 mLs (2.5 mg total) by nebulization every 6 (six) hours as needed for wheezing or shortness of breath.  . fluconazole (DIFLUCAN) 150 MG tablet   . fluticasone (VERAMYST) 27.5 MCG/SPRAY nasal spray Place 2 sprays into the nose daily.  . fluticasone furoate-vilanterol (BREO ELLIPTA) 100-25 MCG/INH AEPB Inhale 1 puff into the lungs daily.  Marland Kitchen guaiFENesin (MUCINEX) 600 MG 12 hr tablet Take by mouth 2 (two) times daily. Reported on 11/09/2015  . INVOKANA 300 MG TABS tablet TAKE 1 TABLET BY MOUTH ONCE DAILY  . levocetirizine (XYZAL) 5 MG tablet   . levothyroxine (SYNTHROID, LEVOTHROID) 75 MCG tablet TAKE 1 TABLET BY MOUTH DAILY.  . montelukast (SINGULAIR) 10 MG tablet Take 1 tablet (10 mg total) by mouth at bedtime.  . pantoprazole (PROTONIX) 40 MG tablet TAKE 1 TABLET BY MOUTH DAILY.   No facility-administered encounter medications on file as of 03/28/2016.     Allergies:  Shellfish allergy  Review of Systems: Gen:  Denies  fever,  sweats. HEENT: Denies blurred vision. Cvc:  No dizziness, chest pain or heaviness Resp:   Denies cough or sputum porduction. Gi: Denies swallowing difficulty, stomach pain. constipation, bowel incontinence Gu:  Denies bladder incontinence, burning urine Ext:   No Joint pain, stiffness. Skin: No skin rash, easy bruising. Endoc:  No polyuria, polydipsia. Psych: No depression, insomnia. Other:  All other systems were reviewed and found to be negative other than what is mentioned in the HPI.   Physical Examination:   VS:  BP 130/72 mmHg  Pulse 110  Ht 5\' 2"  (1.575 m)  Wt 164 lb (74.39 kg)  BMI 29.99 kg/m2  SpO2 99%  General Appearance: No distress  Neuro:without focal findings,  speech normal,  HEENT: PERRLA, EOM intact. Pulmonary: normal breath sounds, Bilateral expiratory wheezing. Tachypnea. CardiovascularNormal S1,S2.  No m/r/g.   Abdomen: Benign, Soft, non-tender. Renal:  No costovertebral tenderness  GU:  Not performed at this time. Endoc: No evident thyromegaly, no signs of acromegaly. Skin:   warm, no rash. Extremities: normal, no cyanosis, clubbing.   LABORATORY PANEL:   CBC No results for input(s): WBC, HGB, HCT, PLT in the last 168 hours. ------------------------------------------------------------------------------------------------------------------  Chemistries  No results for input(s): NA, K, CL, CO2, GLUCOSE, BUN, CREATININE, CALCIUM, MG, AST, ALT, ALKPHOS, BILITOT in the last 168 hours.  Invalid input(s): GFRCGP ------------------------------------------------------------------------------------------------------------------  Cardiac Enzymes No results for input(s): TROPONINI in the last 168 hours. ------------------------------------------------------------  RADIOLOGY:   No results found for this or any previous visit. Results for orders placed during the hospital encounter of 03/27/16  DG Chest 2 View   Narrative CLINICAL DATA:  Wheezing cough and chest tightness.  EXAM: CHEST  2 VIEW  COMPARISON:  None.  FINDINGS: The heart size and mediastinal contours are within normal limits. Both lungs are clear. The visualized skeletal structures are unremarkable.  IMPRESSION: No active cardiopulmonary disease.   Electronically Signed   By: Misty Stanley M.D.   On: 03/27/2016 15:59    ------------------------------------------------------------------------------------------------------------------  Thank  you for allowing [facility], [facility] to  assist in the care of your patient. Our recommendations are noted above.  Please contact us if we can be of further service.   Marda Stalker, MD.  [facility] Office Number: [phone]  Patricia Pesa, M.D.  Vilinda Boehringer, M.D.  Merton Border, M.D  03/28/2016

## 2016-03-28 NOTE — Patient Instructions (Addendum)
Start prednisone taper, 10 mg tabs x 42.  Take 6 tabs for 2 days, then 5 for 2 day, then 4-3-2-1-stop (2 days each).   IgE, CBC with differential levels today  IF YOU DO NOT NOTICE AN IMPROVEMENT BY TOMORROW PLEASE GO TO THE ER.

## 2016-03-29 ENCOUNTER — Telehealth: Payer: Self-pay

## 2016-03-29 LAB — IGE: IgE (Immunoglobulin E), Serum: 320 IU/mL — ABNORMAL HIGH (ref 0–100)

## 2016-03-29 NOTE — Telephone Encounter (Signed)
Called to f/u with pt to see how she is feeling after yesterdays OV per DR if not feeling any better advise pt to go to ED or f/u in clinic  Left voicemail for pt Will await call back

## 2016-03-30 NOTE — Telephone Encounter (Signed)
Pt states she has been laying around but did get up to take a bath and got SOB. Informed pt to call back early next week to f/u or if got any worse to go to ED. Pt verbalized understanding. Nothing further needed.

## 2016-04-02 ENCOUNTER — Telehealth: Payer: 59 | Admitting: Nurse Practitioner

## 2016-04-02 DIAGNOSIS — J0100 Acute maxillary sinusitis, unspecified: Secondary | ICD-10-CM

## 2016-04-02 MED ORDER — AMOXICILLIN-POT CLAVULANATE 875-125 MG PO TABS: 1.0000 | ORAL_TABLET | Freq: Two times a day (BID) | ORAL | Status: DC

## 2016-04-02 NOTE — Progress Notes (Signed)

## 2016-04-04 ENCOUNTER — Telehealth: Payer: Self-pay | Admitting: Internal Medicine

## 2016-04-04 MED ORDER — ALBUTEROL SULFATE HFA 108 (90 BASE) MCG/ACT IN AERS: 2.0000 | INHALATION_SPRAY | RESPIRATORY_TRACT | Status: DC | PRN

## 2016-04-04 NOTE — Telephone Encounter (Signed)
Yes an albuterol inhaler is fine  - albuterol inhaler - 2puff every 3-4 hours as needed for shortness of breath\wheezing\recurrent cough   Vilinda Boehringer, MD [facility] Pager [phone] (please enter 7-digits) On Call Pager - [phone] (please enter 7-digits) Clinic - [phone]

## 2016-04-04 NOTE — Telephone Encounter (Signed)
Pt only has Albuterol neb ordered. Can I send in a rescue inhaler for pt. Thanks.

## 2016-04-04 NOTE — Telephone Encounter (Signed)
Patient is going out of town on vacation and wants to refill all meds before she leaves.     *STAT* If patient is at the pharmacy, call can be transferred to refill team.   1. Which medications need to be refilled? (please list name of each medication and dose if known) Rescue Inhaler (albuterol will be fine)  2. Which pharmacy/location (including street and city if local pharmacy) is medication to be sent to? [facility] pharmacy   3. Do they need a 30 day or 90 day supply? Unspecified

## 2016-04-04 NOTE — Telephone Encounter (Signed)
RX sent to pharmacy. Nothing further needed. 

## 2016-04-24 ENCOUNTER — Encounter: Payer: Self-pay | Admitting: Family

## 2016-04-24 ENCOUNTER — Ambulatory Visit (INDEPENDENT_AMBULATORY_CARE_PROVIDER_SITE_OTHER): Payer: 59 | Admitting: Family

## 2016-04-24 ENCOUNTER — Ambulatory Visit: Payer: 59 | Admitting: Internal Medicine

## 2016-04-24 VITALS — BP 144/90 | HR 104 | Temp 98.1°F | Ht 62.0 in | Wt 165.2 lb

## 2016-04-24 DIAGNOSIS — Z1239 Encounter for other screening for malignant neoplasm of breast: Secondary | ICD-10-CM | POA: Diagnosis not present

## 2016-04-24 DIAGNOSIS — J4541 Moderate persistent asthma with (acute) exacerbation: Secondary | ICD-10-CM

## 2016-04-24 DIAGNOSIS — R7989 Other specified abnormal findings of blood chemistry: Secondary | ICD-10-CM | POA: Diagnosis not present

## 2016-04-24 DIAGNOSIS — E039 Hypothyroidism, unspecified: Secondary | ICD-10-CM | POA: Diagnosis not present

## 2016-04-24 DIAGNOSIS — E119 Type 2 diabetes mellitus without complications: Secondary | ICD-10-CM

## 2016-04-24 DIAGNOSIS — Z0000 Encounter for general adult medical examination without abnormal findings: Secondary | ICD-10-CM | POA: Diagnosis not present

## 2016-04-24 MED ORDER — LEVOTHYROXINE SODIUM 75 MCG PO TABS: 75.0000 ug | ORAL_TABLET | Freq: Every day | ORAL | Status: DC

## 2016-04-24 MED ORDER — CANAGLIFLOZIN 300 MG PO TABS: 300.0000 mg | ORAL_TABLET | Freq: Every day | ORAL | Status: DC

## 2016-04-24 NOTE — Assessment & Plan Note (Signed)
Follows with pulmonology.Stable.

## 2016-04-24 NOTE — Assessment & Plan Note (Signed)
Stable. Pending TSH.  

## 2016-04-24 NOTE — Progress Notes (Signed)
Pre visit review using our clinic review tool, if applicable. No additional management support is needed unless otherwise documented below in the visit note. 

## 2016-04-24 NOTE — Patient Instructions (Addendum)
[place] Maintenance, Female Adopting a healthy lifestyle and getting preventive care can go a long way to promote health and wellness. Talk with your health care provider about what schedule of regular examinations is right for you. This is a good chance for you to check in with your provider about disease prevention and staying healthy. In between checkups, there are plenty of things you can do on your own. Experts have done a lot of research about which lifestyle changes and preventive measures are most likely to keep you healthy. Ask your health care provider for more information. WEIGHT AND DIET  Eat a healthy diet  Be sure to include plenty of vegetables, fruits, low-fat dairy products, and lean protein.  Do not eat a lot of foods high in solid fats, added sugars, or salt.  Get regular exercise. This is one of the most important things you can do for your health.  Most adults should exercise for at least 150 minutes each week. The exercise should increase your heart rate and make you sweat (moderate-intensity exercise).  Most adults should also do strengthening exercises at least twice a week. This is in addition to the moderate-intensity exercise.  Maintain a healthy weight  Body mass index (BMI) is a measurement that can be used to identify possible weight problems. It estimates body fat based on height and weight. Your health care provider can help determine your BMI and help you achieve or maintain a healthy weight.  For females 60 years of age and older:   A BMI below 18.5 is considered underweight.  A BMI of 18.5 to 24.9 is normal.  A BMI of 25 to 29.9 is considered overweight.  A BMI of 30 and above is considered obese.  Watch levels of cholesterol and blood lipids  You should start having your blood tested for lipids and cholesterol at 45 years of age, then have this test every 45 years.  You may need to have your cholesterol levels checked  more often if:  Your lipid or cholesterol levels are high.  You are older than 45 years of age.  You are at high risk for heart disease.  CANCER SCREENING   Lung Cancer  Lung cancer screening is recommended for adults 45-74 years old who are at high risk for lung cancer because of a history of smoking.  A yearly low-dose CT scan of the lungs is recommended for people who:  Currently smoke.  Have quit within the past 15 years.  Have at least a 30-pack-year history of smoking. A pack year is smoking an average of one pack of cigarettes a day for 1 year.  Yearly screening should continue until it has been 15 years since you quit.  Yearly screening should stop if you develop a health problem that would prevent you from having lung cancer treatment.  Breast Cancer  Practice breast self-awareness. This means understanding how your breasts normally appear and feel.  It also means doing regular breast self-exams. Let your health care provider know about any changes, no matter how small.  If you are in your 45s or 30s, you should have a clinical breast exam (CBE) by a health care provider every 1-3 years as part of a regular health exam.  If you are 77 or older, have a CBE every year. Also consider having a breast X-ray (mammogram) every year.  If you have a family history of breast cancer, talk to your health care provider about genetic  screening.  If you are at high risk for breast cancer, talk to your health care provider about having an MRI and a mammogram every year.  Breast cancer gene (BRCA) assessment is recommended for women who have family members with BRCA-related cancers. BRCA-related cancers include:  Breast.  Ovarian.  Tubal.  Peritoneal cancers.  Results of the assessment will determine the need for genetic counseling and BRCA1 and BRCA2 testing. Cervical Cancer Your health care provider may recommend that you be screened regularly for cancer of the pelvic  organs (ovaries, uterus, and vagina). This screening involves a pelvic examination, including checking for microscopic changes to the surface of your cervix (Pap test). You may be encouraged to have this screening done every 3 years, beginning at age 45.  For women ages 62-65, health care providers may recommend pelvic exams and Pap testing every 3 years, or they may recommend the Pap and pelvic exam, combined with testing for human papilloma virus (HPV), every 45 years. Some types of HPV increase your risk of cervical cancer. Testing for HPV may also be done on women of any age with unclear Pap test results.  Other health care providers may not recommend any screening for nonpregnant women who are considered low risk for pelvic cancer and who do not have symptoms. Ask your health care provider if a screening pelvic exam is right for you.  If you have had past treatment for cervical cancer or a condition that could lead to cancer, you need Pap tests and screening for cancer for at least 20 years after your treatment. If Pap tests have been discontinued, your risk factors (such as having a new sexual partner) need to be reassessed to determine if screening should resume. Some women have medical problems that increase the chance of getting cervical cancer. In these cases, your health care provider may recommend more frequent screening and Pap tests. Colorectal Cancer  This type of cancer can be detected and often prevented.  Routine colorectal cancer screening usually begins at 44 years of age and continues through 45 years of age.  Your health care provider may recommend screening at an earlier age if you have risk factors for colon cancer.  Your health care provider may also recommend using home test kits to check for hidden blood in the stool.  A small camera at the end of a tube can be used to examine your colon directly (sigmoidoscopy or colonoscopy). This is done to check for the earliest forms  of colorectal cancer.  Routine screening usually begins at age 45.  Direct examination of the colon should be repeated every 5-10 years through 45 years of age. However, you may need to be screened more often if early forms of precancerous polyps or small growths are found. Skin Cancer  Check your skin from head to toe regularly.  Tell your health care provider about any new moles or changes in moles, especially if there is a change in a mole's shape or color.  Also tell your health care provider if you have a mole that is larger than the size of a pencil eraser.  Always use sunscreen. Apply sunscreen liberally and repeatedly throughout the day.  Protect yourself by wearing long sleeves, pants, a wide-brimmed hat, and sunglasses whenever you are outside. HEART DISEASE, DIABETES, AND HIGH BLOOD PRESSURE   High blood pressure causes heart disease and increases the risk of stroke. High blood pressure is more likely to develop in:  People who have blood pressure  in the high end of the normal range (130-139/85-89 mm Hg).  People who are overweight or obese.  People who are African American.  If you are 18-39 years of age, have your blood pressure checked every 3-5 years. If you are 40 years of age or older, have your blood pressure checked every year. You should have your blood pressure measured twice--once when you are at a hospital or clinic, and once when you are not at a hospital or clinic. Record the average of the two measurements. To check your blood pressure when you are not at a hospital or clinic, you can use:  An automated blood pressure machine at a pharmacy.  A home blood pressure monitor.  If you are between 55 years and 79 years old, ask your health care provider if you should take aspirin to prevent strokes.  Have regular diabetes screenings. This involves taking a blood sample to check your fasting blood sugar level.  If you are at a normal weight and have a low risk  for diabetes, have this test once every three years after 45 years of age.  If you are overweight and have a high risk for diabetes, consider being tested at a younger age or more often. PREVENTING INFECTION  Hepatitis B  If you have a higher risk for hepatitis B, you should be screened for this virus. You are considered at high risk for hepatitis B if:  You were born in a country where hepatitis B is common. Ask your health care provider which countries are considered high risk.  Your parents were born in a high-risk country, and you have not been immunized against hepatitis B (hepatitis B vaccine).  You have HIV or AIDS.  You use needles to inject street drugs.  You live with someone who has hepatitis B.  You have had sex with someone who has hepatitis B.  You get hemodialysis treatment.  You take certain medicines for conditions, including cancer, organ transplantation, and autoimmune conditions. Hepatitis C  Blood testing is recommended for:  Everyone born from 1945 through 1965.  Anyone with known risk factors for hepatitis C. Sexually transmitted infections (STIs)  You should be screened for sexually transmitted infections (STIs) including gonorrhea and chlamydia if:  You are sexually active and are younger than 45 years of age.  You are older than 45 years of age and your health care provider tells you that you are at risk for this type of infection.  Your sexual activity has changed since you were last screened and you are at an increased risk for chlamydia or gonorrhea. Ask your health care provider if you are at risk.  If you do not have HIV, but are at risk, it may be recommended that you take a prescription medicine daily to prevent HIV infection. This is called pre-exposure prophylaxis (PrEP). You are considered at risk if:  You are sexually active and do not regularly use condoms or know the HIV status of your partner(s).  You take drugs by injection.  You  are sexually active with a partner who has HIV. Talk with your health care provider about whether you are at high risk of being infected with HIV. If you choose to begin PrEP, you should first be tested for HIV. You should then be tested every 3 months for as long as you are taking PrEP.  PREGNANCY   If you are premenopausal and you may become pregnant, ask your health care provider about preconception counseling.    If you may become pregnant, take 400 to 800 micrograms (mcg) of folic acid every day.  If you want to prevent pregnancy, talk to your health care provider about birth control (contraception). OSTEOPOROSIS AND MENOPAUSE   Osteoporosis is a disease in which the bones lose minerals and strength with aging. This can result in serious bone fractures. Your risk for osteoporosis can be identified using a bone density scan.  If you are 51 years of age or older, or if you are at risk for osteoporosis and fractures, ask your health care provider if you should be screened.  Ask your health care provider whether you should take a calcium or vitamin D supplement to lower your risk for osteoporosis.  Menopause may have certain physical symptoms and risks.  Hormone replacement therapy may reduce some of these symptoms and risks. Talk to your health care provider about whether hormone replacement therapy is right for you.  HOME CARE INSTRUCTIONS   Schedule regular health, dental, and eye exams.  Stay current with your immunizations.   Do not use any tobacco products including cigarettes, chewing tobacco, or electronic cigarettes.  If you are pregnant, do not drink alcohol.  If you are breastfeeding, limit how much and how often you drink alcohol.  Limit alcohol intake to no more than 1 drink per day for nonpregnant women. One drink equals 12 ounces of beer, 5 ounces of wine, or 1 ounces of hard liquor.  Do not use street drugs.  Do not share needles.  Ask your health care provider  for help if you need support or information about quitting drugs.  Tell your health care provider if you often feel depressed.  Tell your health care provider if you have ever been abused or do not feel safe at home.   This information is not intended to replace advice given to you by your health care provider. Make sure you discuss any questions you have with your health care provider.   Document Released: 04/10/2011 Document Revised: 10/16/2014 Document Reviewed: 08/27/2013 Elsevier Interactive Patient Education [employer].

## 2016-04-24 NOTE — Assessment & Plan Note (Addendum)
On invokana. Stable. Pending A1C

## 2016-04-24 NOTE — Progress Notes (Signed)
Subjective:    Patient ID: Bridget Peters, female    DOB: 05-10-1971, 45 y.o.   MRN: [id number]  HPI Bridget Peters is a 45 y.o. female who presents today to establish care. Prior care at had been with Bridget Peters in our clinic.   No complaints today. Follows with pulmonology and allergist.   Colorectal Cancer Screening: no early family history. Will start at 44 yo Breast Cancer Screening: last 02/2014, benign. Due. Cervical Cancer Screening: 02/2014, normal; doesn't follow with GYN.   Immunizations       Tetanus - 10/2009        Pneumococcal - 02/2012 HIV Screening- Politely declined Labs: Screening labs today. Exercise: Gets regular exercise. Working on weight loss.  Alcohol use: 2 drinks per month Smoking/tobacco use: Nonsmoker.  Regular dental exams:UTD Wears seat belt: Yes.  Foot Exam: To do today Eye: Needs  DM: Doing 3-10 shakes nutrition program; checks BS once per day. No numbness, tingling. Hypothyroid: No palpitations, unintentional weight loss.       BP Readings from Last 3 Encounters:  04/24/16 144/90  03/28/16 130/72  12/07/15 132/76   Wt Readings from Last 3 Encounters:  04/24/16 165 lb 3.2 oz (74.934 kg)  03/28/16 164 lb (74.39 kg)  12/07/15 164 lb (74.39 kg)    Past Medical History  Diagnosis Date  . Asthma   . Thyroid disease   . Pneumonia   . Diabetes mellitus without complication ([place])   . Allergy     Seasonal    Allergies: Shellfish allergy  Current Outpatient Prescriptions on File Prior to Visit  Medication Sig Dispense Refill  . albuterol (PROVENTIL HFA;VENTOLIN HFA) 108 (90 Base) MCG/ACT inhaler Inhale 2 puffs into the lungs every 4 (four) hours as needed for wheezing or shortness of breath. 1 Inhaler 6  . albuterol (PROVENTIL) (2.5 MG/3ML) 0.083% nebulizer solution Take 3 mLs (2.5 mg total) by nebulization every 6 (six) hours as needed for wheezing or shortness of breath. 75 mL 12  . amoxicillin-clavulanate (AUGMENTIN)  875-125 MG tablet Take 1 tablet by mouth 2 (two) times daily. 20 tablet 0  . fluconazole (DIFLUCAN) 150 MG tablet   1  . fluticasone (VERAMYST) 27.5 MCG/SPRAY nasal spray Place 2 sprays into the nose daily. 10 g 12  . fluticasone furoate-vilanterol (BREO ELLIPTA) 100-25 MCG/INH AEPB Inhale 1 puff into the lungs daily. 60 each 11  . guaiFENesin (MUCINEX) 600 MG 12 hr tablet Take by mouth 2 (two) times daily. Reported on 11/09/2015    . levocetirizine (XYZAL) 5 MG tablet   3  . montelukast (SINGULAIR) 10 MG tablet Take 1 tablet (10 mg total) by mouth at bedtime. 30 tablet 1  . pantoprazole (PROTONIX) 40 MG tablet TAKE 1 TABLET BY MOUTH DAILY. 30 tablet 5  . predniSONE (DELTASONE) 10 MG tablet 6 tabs for 2 days, 5 tabs for 2 days, 4 tabs for 2 days, 3 tabs for 2 days, 2 tabs for 2 days 1 tab for 2 days then stop 42 tablet 0   No current facility-administered medications on file prior to visit.    Social History  Substance Use Topics  . Smoking status: Never Smoker   . Smokeless tobacco: Never Used  . Alcohol Use: No    Review of Systems  Constitutional: Negative for fever and chills.  Respiratory: Negative for cough.   Cardiovascular: Negative for chest pain and palpitations.  Gastrointestinal: Negative for nausea and vomiting.      Objective:  BP 144/90 mmHg  Pulse 104  Temp(Src) 98.1 F (36.7 C)  Ht 5\' 2"  (1.575 m)  Wt 165 lb 3.2 oz (74.934 kg)  BMI 30.21 kg/m2  SpO2 98%   Physical Exam  Constitutional: She appears well-developed and well-nourished.  Eyes: Conjunctivae are normal.  Cardiovascular: Normal rate, regular rhythm, normal heart sounds and normal pulses.   Pulmonary/Chest: Effort normal and breath sounds normal. She has no wheezes. She has no rhonchi. She has no rales. Right breast exhibits no inverted nipple, no mass, no nipple discharge, no skin change and no tenderness. Left breast exhibits no inverted nipple, no mass, no nipple discharge, no skin change and no  tenderness. Breasts are symmetrical.  Surgical scars on breast from reduction.   Neurological: She is alert.  Skin: Skin is warm and dry.  Psychiatric: She has a normal mood and affect. Her speech is normal and behavior is normal. Thought content normal.  Vitals reviewed.      Assessment & Plan:   Problem List Items Addressed This Visit      Respiratory   Asthma, moderate persistent    Follows with pulmonology.Stable.         Endocrine   Hypothyroidism    Stable. Pending TSH.      Relevant Medications   levothyroxine (SYNTHROID, LEVOTHROID) 75 MCG tablet   Diabetes mellitus type 2, controlled, without complications ([place])    On invokana. Stable. Pending A1C      Relevant Medications   canagliflozin (INVOKANA) 300 MG TABS tablet   Other Relevant Orders   Hemoglobin A1c    Other Visit Diagnoses    Encounter for health maintenance examination    -  Primary    Relevant Orders    MM DIGITAL SCREENING BILATERAL    CBC with Differential/Platelet    Comprehensive metabolic panel    Hemoglobin A1c    Lipid panel    Microalbumin / creatinine urine ratio    TSH    VITAMIN D 25 Hydroxy (Vit-D Deficiency, Fractures)    Screening breast examination            I have changed [name]'s INVOKANA to canagliflozin. I have also changed her levothyroxine. I am also having her maintain her guaiFENesin, montelukast, fluticasone, levocetirizine, fluconazole, albuterol, fluticasone furoate-vilanterol, pantoprazole, predniSONE, amoxicillin-clavulanate, and albuterol.   Meds ordered this encounter  Medications  . levothyroxine (SYNTHROID, LEVOTHROID) 75 MCG tablet    Sig: Take 1 tablet (75 mcg total) by mouth daily.    Dispense:  90 tablet    Refill:  3    Order Specific Question:  Supervising Provider    Answer:  Cassandria Anger [[id number]]  . canagliflozin (INVOKANA) 300 MG TABS tablet    Sig: Take 1 tablet (300 mg total) by mouth daily.    Dispense:  90 tablet    Refill:   3    Order Specific Question:  Supervising Provider    Answer:  Cassandria Anger [[id number]]    Return precautions given.   Risks, benefits, and alternatives of the medications and treatment plan prescribed today were discussed, and patient expressed understanding.   Education regarding symptom management and diagnosis given to patient on AVS.    Continue to follow with Rica Mast, MD for routine health maintenance.   Velita E Iglesias and I agreed with plan.   Mable Paris, FNP

## 2016-04-25 ENCOUNTER — Telehealth: Payer: Self-pay | Admitting: Family

## 2016-04-25 ENCOUNTER — Ambulatory Visit
Admission: RE | Admit: 2016-04-25 | Discharge: 2016-04-25 | Disposition: A | Discharge status: Home / Self Care | Payer: 59 | Source: Ambulatory Visit | Attending: Family | Admitting: Family

## 2016-04-25 ENCOUNTER — Other Ambulatory Visit: Payer: Self-pay | Admitting: Family

## 2016-04-25 DIAGNOSIS — Z0000 Encounter for general adult medical examination without abnormal findings: Secondary | ICD-10-CM

## 2016-04-25 DIAGNOSIS — R748 Abnormal levels of other serum enzymes: Secondary | ICD-10-CM

## 2016-04-25 DIAGNOSIS — Z1231 Encounter for screening mammogram for malignant neoplasm of breast: Secondary | ICD-10-CM | POA: Diagnosis not present

## 2016-04-25 DIAGNOSIS — E559 Vitamin D deficiency, unspecified: Secondary | ICD-10-CM

## 2016-04-25 DIAGNOSIS — E119 Type 2 diabetes mellitus without complications: Secondary | ICD-10-CM

## 2016-04-25 DIAGNOSIS — E782 Mixed hyperlipidemia: Secondary | ICD-10-CM

## 2016-04-25 LAB — COMPREHENSIVE METABOLIC PANEL
ALT: 84 U/L — ABNORMAL HIGH (ref 0–35)
AST: 32 U/L (ref 0–37)
Albumin: 4.9 g/dL (ref 3.5–5.2)
Alkaline Phosphatase: 113 U/L (ref 39–117)
BUN: 14 mg/dL (ref 6–23)
CHLORIDE: 95 meq/L — AB (ref 96–112)
CO2: 28 meq/L (ref 19–32)
CREATININE: 0.92 mg/dL (ref 0.40–1.20)
Calcium: 10.2 mg/dL (ref 8.4–10.5)
GFR: 70.07 mL/min (ref >60.00)
Glucose, Bld: 172 mg/dL — ABNORMAL HIGH (ref 70–99)
Potassium: 4.2 mEq/L (ref 3.5–5.1)
SODIUM: 134 meq/L — AB (ref 135–145)
Total Bilirubin: 0.6 mg/dL (ref 0.2–1.2)
Total Protein: 7.6 g/dL (ref 6.0–8.3)

## 2016-04-25 LAB — LDL CHOLESTEROL, DIRECT: Direct LDL: 233 mg/dL

## 2016-04-25 LAB — LIPID PANEL
CHOL/HDL RATIO: 15
Cholesterol: 742 mg/dL — ABNORMAL HIGH (ref 0–200)
HDL: 49.5 mg/dL (ref >39.00)
Triglycerides: 1574 mg/dL — ABNORMAL HIGH (ref 0.0–149.0)

## 2016-04-25 LAB — CBC WITH DIFFERENTIAL/PLATELET
BASOS PCT: 0.5 % (ref 0.0–3.0)
Basophils Absolute: 0 10*3/uL (ref 0.0–0.1)
EOS ABS: 0.2 10*3/uL (ref 0.0–0.7)
Eosinophils Relative: 2.6 % (ref 0.0–5.0)
HCT: 44.5 % (ref 36.0–46.0)
Hemoglobin: 15.4 g/dL — ABNORMAL HIGH (ref 12.0–15.0)
Lymphocytes Relative: 30.1 % (ref 12.0–46.0)
Lymphs Abs: 2 10*3/uL (ref 0.7–4.0)
MCHC: 34.6 g/dL (ref 30.0–36.0)
MCV: 83.8 fl (ref 78.0–100.0)
MONO ABS: 0.4 10*3/uL (ref 0.1–1.0)
Monocytes Relative: 6.1 % (ref 3.0–12.0)
NEUTROS ABS: 4.1 10*3/uL (ref 1.4–7.7)
NEUTROS PCT: 60.7 % (ref 43.0–77.0)
PLATELETS: 273 10*3/uL (ref 150.0–400.0)
RBC: 5.3 Mil/uL — ABNORMAL HIGH (ref 3.87–5.11)
RDW: 14.1 % (ref 11.5–15.5)
WBC: 6.8 10*3/uL (ref 4.0–10.5)

## 2016-04-25 LAB — VITAMIN D 25 HYDROXY (VIT D DEFICIENCY, FRACTURES): VITD: 22.94 ng/mL — ABNORMAL LOW (ref 30.00–100.00)

## 2016-04-25 LAB — TSH: TSH: 1.1 u[IU]/mL (ref 0.35–4.50)

## 2016-04-25 LAB — MICROALBUMIN / CREATININE URINE RATIO
Creatinine,U: 51.2 mg/dL
MICROALB UR: 1.6 mg/dL (ref 0.0–1.9)
MICROALB/CREAT RATIO: 3.1 mg/g (ref 0.0–30.0)

## 2016-04-25 LAB — HEMOGLOBIN A1C: Hgb A1c MFr Bld: 8.6 % — ABNORMAL HIGH (ref 4.6–6.5)

## 2016-04-25 MED ORDER — METFORMIN HCL ER 500 MG PO TB24: ORAL_TABLET | ORAL | Status: DC

## 2016-04-25 MED ORDER — CHOLECALCIFEROL 1.25 MG (50000 UT) PO TABS: ORAL_TABLET | ORAL | Status: DC

## 2016-04-25 MED ORDER — ROSUVASTATIN CALCIUM 40 MG PO TABS
40.0000 mg | ORAL_TABLET | Freq: Every day | ORAL | Status: DC
Start: 2016-04-25 — End: 2018-04-15

## 2016-04-25 NOTE — Telephone Encounter (Signed)
Please call patient and let her know that labs reflect elevated, cholesterol and triglycerides. I placed an order to start a medication for cholesterol management. This is likely worsened with DM. Have placed referral to Cardiology for further cholesterol advanced screen. She should also come back here for recheck in one month.   Liver enzymes also elevated likely from cholesterol. Will need to recheck this in 2 weeks at follow up.   She knows she has DM; I'm adding Metformin as well as referral to nutritionist for further education. Metformin may cause GI upset however this gets better with time. She also was low in vitamin D of which I'm also supplementing as well.   Please be sure to spend some time with her on the phone as I know this is A LOT from one visit. Please also make a two week month follow up , she can do labs again at that time, for patient to ensure we are on the right track.

## 2016-04-25 NOTE — Telephone Encounter (Signed)
Please call pt at [phone]

## 2016-04-25 NOTE — Telephone Encounter (Signed)
Pt was a doctor's office she's supposed to call back.

## 2016-04-26 ENCOUNTER — Encounter: Payer: Self-pay | Admitting: Family

## 2016-04-26 DIAGNOSIS — E7800 Pure hypercholesterolemia, unspecified: Secondary | ICD-10-CM | POA: Insufficient documentation

## 2016-04-26 NOTE — Telephone Encounter (Signed)
Left a detailed message with rx information, thanks

## 2016-04-26 NOTE — Telephone Encounter (Signed)
Spoke with the patient, reviewed your notes.  Question, she was to have a vitamin D supplement, I don't see that ordered? She was fine with your suggestions and referral.  Scheduled for a lab recheck in 2 weeks. thanks

## 2016-04-26 NOTE — Telephone Encounter (Signed)
It's there :) The cholecalciferol was sent in with the crestor.   Thanks for speaking with her Lavella Lemons!

## 2016-05-08 ENCOUNTER — Ambulatory Visit (INDEPENDENT_AMBULATORY_CARE_PROVIDER_SITE_OTHER): Payer: 59 | Admitting: Internal Medicine

## 2016-05-08 ENCOUNTER — Encounter: Payer: Self-pay | Admitting: Internal Medicine

## 2016-05-08 VITALS — BP 128/72 | HR 65 | Ht 62.0 in | Wt 166.0 lb

## 2016-05-08 DIAGNOSIS — J0100 Acute maxillary sinusitis, unspecified: Secondary | ICD-10-CM | POA: Diagnosis not present

## 2016-05-08 MED ORDER — AMOXICILLIN-POT CLAVULANATE 875-125 MG PO TABS: 1.0000 | ORAL_TABLET | Freq: Two times a day (BID) | ORAL | 0 refills | Status: AC

## 2016-05-08 NOTE — Progress Notes (Signed)
[name]      Date: 05/08/2016,   MRN# [id number] [name] [birth year] Code Status:  Hosp day:@LENGTHOFSTAYDAYS @ Referring MD: @ATDPROV @     PCP:      AdmissionWeight: 166 lb (75.3 kg)                 CurrentWeight: 166 lb (75.3 kg) Bridget Peters is a 45 y.o. old female seen in consultation for asthma     CHIEF COMPLAINT:   Follow up [place]  Patient with sinus tenderness, +cough, some  wheezing Patient restarted  taking BREoseems to be tolerating better She has had 2-3 ASTHMA exacerbations in the past 6 months  Patient symptoms of  sinus infection at this time Ige levels 320, Eos count 200 IgE levels     MEDICATIONS    Home Medication:   Current Medication:  Current Outpatient Prescriptions:  .  albuterol (PROVENTIL HFA;VENTOLIN HFA) 108 (90 Base) MCG/ACT inhaler, Inhale 2 puffs into the lungs every 4 (four) hours as needed for wheezing or shortness of breath., Disp: 1 Inhaler, Rfl: 6 .  albuterol (PROVENTIL) (2.5 MG/3ML) 0.083% nebulizer solution, Take 3 mLs (2.5 mg total) by nebulization every 6 (six) hours as needed for wheezing or shortness of breath., Disp: 75 mL, Rfl: 12 .  canagliflozin (INVOKANA) 300 MG TABS tablet, Take 1 tablet (300 mg total) by mouth daily., Disp: 90 tablet, Rfl: 3 .  Cholecalciferol 50000 units TABS, 50,000 units PO qwk for 8 weeks., Disp: 8 tablet, Rfl: 0 .  fluconazole (DIFLUCAN) 150 MG tablet, , Disp: , Rfl: 1 .  fluticasone (VERAMYST) 27.5 MCG/SPRAY nasal spray, Place 2 sprays into the nose daily., Disp: 10 g, Rfl: 12 .  fluticasone furoate-vilanterol (BREO ELLIPTA) 100-25 MCG/INH AEPB, Inhale 1 puff into the lungs daily., Disp: 60 each, Rfl: 11 .  guaiFENesin (MUCINEX) 600 MG 12 hr tablet, Take by mouth 2 (two) times daily. Reported on 11/09/2015, Disp: , Rfl:  .  levocetirizine (XYZAL) 5 MG tablet, , Disp: , Rfl: 3 .  levothyroxine (SYNTHROID,  LEVOTHROID) 75 MCG tablet, Take 1 tablet (75 mcg total) by mouth daily., Disp: 90 tablet, Rfl: 3 .  metFORMIN (GLUCOPHAGE XR) 500 MG 24 hr tablet, Start 500mg  PO qpm., Disp: 90 tablet, Rfl: 3 .  montelukast (SINGULAIR) 10 MG tablet, Take 1 tablet (10 mg total) by mouth at bedtime., Disp: 30 tablet, Rfl: 1 .  pantoprazole (PROTONIX) 40 MG tablet, TAKE 1 TABLET BY MOUTH DAILY., Disp: 30 tablet, Rfl: 5 .  rosuvastatin (CRESTOR) 40 MG tablet, Take 1 tablet (40 mg total) by mouth daily., Disp: 90 tablet, Rfl: 3 .  Vitamin D, Ergocalciferol, (DRISDOL) 50000 units CAPS capsule, , Disp: , Rfl: 0    ALLERGIES   Shellfish allergy     REVIEW OF SYSTEMS   Review of Systems  Constitutional: Positive for malaise/fatigue. Negative for chills, fever and weight loss.  HENT: Positive for congestion.   Respiratory: Positive for cough and wheezing. Negative for hemoptysis and sputum production.   Cardiovascular: Negative for chest pain.  Endo/Heme/Allergies: Negative.   All other systems reviewed and are negative.    VS: BP 128/72 (BP Location: Left Arm, Cuff Size: Normal)   Pulse 65   Ht 5\' 2"  (1.575 m)   Wt 166 lb (75.3 kg)   SpO2 99%   BMI 30.36 kg/m      PHYSICAL EXAM  Physical Exam  Constitutional: She is oriented  to person, place, and time. She appears well-developed and well-nourished. No distress.  HENT:  Head: Normocephalic and atraumatic.  Mouth/Throat: No oropharyngeal exudate.  +sinus tenderness  Eyes: EOM are normal. Pupils are equal, round, and reactive to light.  Cardiovascular: Normal rate, regular rhythm and normal heart sounds.   No murmur heard. Pulmonary/Chest: No respiratory distress. She has no wheezes.  Musculoskeletal: Normal range of motion.  Neurological: She is alert and oriented to person, place, and time.  Skin: Skin is warm. She is not diaphoretic.  Psychiatric: She has a normal mood and affect.     IgE levels=320 Eosinophil count=200  IgE      ASSESSMENT/PLAN   45 yo white female seen today for severe chronic persistent asthma without exacerbation at this time in the setting of acute sinus infection.  With elevated IgE levels and elevated EOS count, with several ASTHMA exacerbations in the past 6 months. Patient meets criteria for Immnuotherapy  1.avoid all triggers 2.continue BREO 3.albuterol as needed 4.continue PPI 5.continue veramyst 6.continue singulair/zyrtec 7.Referal to Immunotherapy ASAP 8.augmentin for 10 days for acute sinus infection  Follow up in 3 months  The Patient requires high complexity decision making for assessment and support, frequent evaluation and titration of therapies, application of advanced monitoring technologies and extensive interpretation of multiple databases.   Patient satisfied with Plan of action and management. All questions answered  [name], M.D.  [facility]  Medical Director [place] Director [facility] Department

## 2016-05-08 NOTE — Patient Instructions (Signed)
Assess for immunotherapy ini Conception Continue Breo Albuterol as needed augmentin for sinus infection  Asthma Attack Prevention While you may not be able to control the fact that you have asthma, you can take actions to prevent asthma attacks. The best way to prevent asthma attacks is to maintain good control of your asthma. You can achieve this by:  Taking your medicines as directed.  Avoiding things that can irritate your airways or make your asthma symptoms worse (asthma triggers).  Keeping track of how well your asthma is controlled and of any changes in your symptoms.  Responding quickly to worsening asthma symptoms (asthma attack).  Seeking emergency care when it is needed. WHAT ARE SOME WAYS TO PREVENT AN ASTHMA ATTACK? Have a Plan Work with your health care provider to create a written plan for managing and treating your asthma attacks (asthma action plan). This plan includes:  A list of your asthma triggers and how you can avoid them.  Information on when medicines should be taken and when their dosages should be changed.  The use of a device that measures how well your lungs are working (peak flow meter). Monitor Your Asthma Use your peak flow meter and record your results in a journal every day. A drop in your peak flow numbers on one or more days may indicate the start of an asthma attack. This can happen even before you start to feel symptoms. You can prevent an asthma attack from getting worse by following the steps in your asthma action plan. Avoid Asthma Triggers Work with your asthma health care provider to find out what your asthma triggers are. This can be done by:  Allergy testing.  Keeping a journal that notes when asthma attacks occur and the factors that may have contributed to them.  Determining if there are other medical conditions that are making your asthma worse. Once you have determined your asthma triggers, take steps to avoid them. This may include  avoiding excessive or prolonged exposure to:  Dust. Have someone dust and vacuum your home for you once or twice a week. Using a high-efficiency particulate arrestance (HEPA) vacuum is best.  Smoke. This includes campfire smoke, forest fire smoke, and secondhand smoke from tobacco products.  Pet dander. Avoid contact with animals that you know you are allergic to.  Allergens from trees, grasses or pollens. Avoid spending a lot of time outdoors when pollen counts are high, and on very windy days.  Very cold, dry, or humid air.  Mold.  Foods that contain high amounts of sulfites.  Strong odors.  Outdoor air pollutants, such as Lexicographer.  Indoor air pollutants, such as aerosol sprays and fumes from household cleaners.  Household pests, including dust mites and cockroaches, and pest droppings.  Certain medicines, including NSAIDs. Always talk to your health care provider before stopping or starting any new medicines. Medicines Take over-the-counter and prescription medicines only as told by your health care provider. Many asthma attacks can be prevented by carefully following your medicine schedule. Taking your medicines correctly is especially important when you cannot avoid certain asthma triggers. Act Quickly If an asthma attack does happen, acting quickly can decrease how severe it is and how long it lasts. Take these steps:   Pay attention to your symptoms. If you are coughing, wheezing, or having difficulty breathing, do not wait to see if your symptoms go away on their own. Follow your asthma action plan.  If you have followed your asthma action plan and  your symptoms are not improving, call your health care provider or seek immediate medical care at the nearest hospital. It is important to note how often you need to use your fast-acting rescue inhaler. If you are using your rescue inhaler more often, it may mean that your asthma is not under control. Adjusting your asthma  treatment plan may help you to prevent future asthma attacks and help you to gain better control of your condition. HOW CAN I PREVENT AN ASTHMA ATTACK WHEN I EXERCISE? Follow advice from your health care provider about whether you should use your fast-acting inhaler before exercising. Many people with asthma experience exercise-induced bronchoconstriction (EIB). This condition often worsens during vigorous exercise in cold, humid, or dry environments. Usually, people with EIB can stay very active by pre-treating with a fast-acting inhaler before exercising.   This information is not intended to replace advice given to you by your health care provider. Make sure you discuss any questions you have with your health care provider.   Document Released: 09/13/2009 Document Revised: 06/16/2015 Document Reviewed: 02/25/2015 Elsevier Interactive Patient Education [employer].

## 2016-05-10 ENCOUNTER — Encounter: Payer: Self-pay | Admitting: *Deleted

## 2016-05-11 ENCOUNTER — Other Ambulatory Visit (INDEPENDENT_AMBULATORY_CARE_PROVIDER_SITE_OTHER): Payer: 59

## 2016-05-11 DIAGNOSIS — R7989 Other specified abnormal findings of blood chemistry: Secondary | ICD-10-CM

## 2016-05-11 DIAGNOSIS — R748 Abnormal levels of other serum enzymes: Secondary | ICD-10-CM | POA: Diagnosis not present

## 2016-05-11 DIAGNOSIS — E782 Mixed hyperlipidemia: Secondary | ICD-10-CM

## 2016-05-11 LAB — COMPREHENSIVE METABOLIC PANEL
ALK PHOS: 77 U/L (ref 39–117)
ALT: 27 U/L (ref 0–35)
AST: 22 U/L (ref 0–37)
Albumin: 4.7 g/dL (ref 3.5–5.2)
BUN: 22 mg/dL (ref 6–23)
CHLORIDE: 103 meq/L (ref 96–112)
CO2: 27 mEq/L (ref 19–32)
Calcium: 10.1 mg/dL (ref 8.4–10.5)
Creatinine, Ser: 0.8 mg/dL (ref 0.40–1.20)
GFR: 82.32 mL/min (ref >60.00)
GLUCOSE: 136 mg/dL — AB (ref 70–99)
POTASSIUM: 4.4 meq/L (ref 3.5–5.1)
Sodium: 140 mEq/L (ref 135–145)
TOTAL PROTEIN: 7.6 g/dL (ref 6.0–8.3)
Total Bilirubin: 0.6 mg/dL (ref 0.2–1.2)

## 2016-05-11 LAB — LDL CHOLESTEROL, DIRECT: Direct LDL: 160 mg/dL

## 2016-05-11 LAB — LIPID PANEL
Cholesterol: 272 mg/dL — ABNORMAL HIGH (ref 0–200)
HDL: 62.7 mg/dL (ref >39.00)
NONHDL: 209.03
TRIGLYCERIDES: 289 mg/dL — AB (ref 0.0–149.0)
Total CHOL/HDL Ratio: 4
VLDL: 57.8 mg/dL — AB (ref 0.0–40.0)

## 2016-05-24 ENCOUNTER — Telehealth: Payer: Self-pay | Admitting: Internal Medicine

## 2016-05-24 ENCOUNTER — Telehealth: Payer: Self-pay | Admitting: Pharmacist

## 2016-05-24 MED ORDER — OMALIZUMAB 150 MG ~~LOC~~ SOLR: 300.0000 mg | SUBCUTANEOUS | 6 refills | Status: DC

## 2016-05-24 NOTE — Telephone Encounter (Signed)
Called patient to schedule an appointment for the [facility]. I was unable to reach the patient so I left a HIPAA-compliant message requesting that the patient return my call.   

## 2016-05-24 NOTE — Telephone Encounter (Signed)
Pt returned my call-she is aware that [place] will be sent to [facility] outpatient pharmacy (pt's choice) and appt set up on 06-02-16 at 2:30pm to start first injections of Xolair. Pt aware of 2 hour wait and to bring her Epipen with her. Rx has been sent to [place] and Allergy dept updated as well. Nothing more needed at this time.

## 2016-05-24 NOTE — Telephone Encounter (Signed)
LMTCB-will ask for Katie.  

## 2016-05-28 ENCOUNTER — Encounter: Payer: Self-pay | Admitting: Internal Medicine

## 2016-05-29 ENCOUNTER — Other Ambulatory Visit: Payer: Self-pay | Admitting: *Deleted

## 2016-05-29 MED ORDER — EPINEPHRINE 0.3 MG/0.3ML IJ SOAJ: 0.3000 mg | Freq: Once | INTRAMUSCULAR | 1 refills | Status: AC

## 2016-06-02 ENCOUNTER — Ambulatory Visit: Payer: 59

## 2016-06-07 ENCOUNTER — Telehealth: Payer: Self-pay | Admitting: Pharmacist

## 2016-06-07 NOTE — Telephone Encounter (Signed)
Called patient to schedule an appointment for the [facility]. I was unable to reach the patient so I left a HIPAA-compliant message requesting that the patient return my call.   

## 2016-06-08 ENCOUNTER — Ambulatory Visit: Payer: 59

## 2016-06-13 ENCOUNTER — Encounter (HOSPITAL_COMMUNITY): Payer: Self-pay | Admitting: Emergency Medicine

## 2016-06-13 ENCOUNTER — Emergency Department (HOSPITAL_COMMUNITY)
Admission: EM | Admit: 2016-06-13 | Discharge: 2016-06-13 | Disposition: A | Discharge status: Home / Self Care | Payer: 59 | Attending: Emergency Medicine | Admitting: Emergency Medicine

## 2016-06-13 ENCOUNTER — Ambulatory Visit (INDEPENDENT_AMBULATORY_CARE_PROVIDER_SITE_OTHER): Payer: 59

## 2016-06-13 ENCOUNTER — Ambulatory Visit (INDEPENDENT_AMBULATORY_CARE_PROVIDER_SITE_OTHER): Payer: 59 | Admitting: Adult Health

## 2016-06-13 ENCOUNTER — Ambulatory Visit (HOSPITAL_BASED_OUTPATIENT_CLINIC_OR_DEPARTMENT_OTHER): Payer: 59 | Admitting: Pharmacist

## 2016-06-13 ENCOUNTER — Telehealth: Payer: Self-pay | Admitting: Internal Medicine

## 2016-06-13 ENCOUNTER — Telehealth: Payer: Self-pay | Admitting: *Deleted

## 2016-06-13 VITALS — BP 156/98 | HR 85 | Temp 98.1°F

## 2016-06-13 DIAGNOSIS — J45909 Unspecified asthma, uncomplicated: Secondary | ICD-10-CM | POA: Insufficient documentation

## 2016-06-13 DIAGNOSIS — E039 Hypothyroidism, unspecified: Secondary | ICD-10-CM | POA: Diagnosis not present

## 2016-06-13 DIAGNOSIS — L299 Pruritus, unspecified: Secondary | ICD-10-CM | POA: Diagnosis not present

## 2016-06-13 DIAGNOSIS — J4540 Moderate persistent asthma, uncomplicated: Secondary | ICD-10-CM | POA: Diagnosis not present

## 2016-06-13 DIAGNOSIS — Z79899 Other long term (current) drug therapy: Secondary | ICD-10-CM | POA: Diagnosis not present

## 2016-06-13 DIAGNOSIS — T8069XA Other serum reaction due to other serum, initial encounter: Secondary | ICD-10-CM | POA: Insufficient documentation

## 2016-06-13 DIAGNOSIS — Z7984 Long term (current) use of oral hypoglycemic drugs: Secondary | ICD-10-CM | POA: Diagnosis not present

## 2016-06-13 DIAGNOSIS — T7840XA Allergy, unspecified, initial encounter: Secondary | ICD-10-CM

## 2016-06-13 DIAGNOSIS — E119 Type 2 diabetes mellitus without complications: Secondary | ICD-10-CM | POA: Insufficient documentation

## 2016-06-13 MED ORDER — OMALIZUMAB 150 MG ~~LOC~~ SOLR: 300.0000 mg | SUBCUTANEOUS | 5 refills | Status: DC

## 2016-06-13 MED ORDER — METHYLPREDNISOLONE ACETATE 80 MG/ML IJ SUSP
120.0000 mg | Freq: Once | INTRAMUSCULAR | Status: AC
  Administered 2016-06-13: 120 mg via INTRAMUSCULAR

## 2016-06-13 MED ORDER — FAMOTIDINE IN NACL 20-0.9 MG/50ML-% IV SOLN
20.0000 mg | Freq: Once | INTRAVENOUS | Status: AC
  Administered 2016-06-13: 20 mg via INTRAVENOUS
  Filled 2016-06-13: qty 50

## 2016-06-13 MED FILL — *XOLAIR 150MG VIAL: 150 | 28 days supply | Qty: 4 | Fill #0

## 2016-06-13 NOTE — Progress Notes (Signed)
   S: Patient presents today to the [facility].  Patient is currently taking Xolair for asthma. Patient is managed by Dr. Mortimer Fries for this.   Adherence: first dose to be administered today  Dosing: Give subcutaneously.  Can be dosed every 2 or 4 weeks based on baseline serum IgE levels and body weight.  Asthma: SubQ: Dose and frequency based on body weight and pretreatment total IgE serum levels. Dosing should be adjusted during therapy for significant changes in body weight. Dosing should not be adjusted based on total IgE levels taken during treatment or <1 year following interruption of therapy. If therapy has been interrupted for ?1 year, total IgE levels may be re-evaluated for dosage determination.  Pretreatment serum IgE >300 to 400 units/mL:  >70 to 90 kg: 300 mg every 2 weeks   Drug-drug interactions: none   O:     Lab Results  Component Value Date   WBC 6.8 04/24/2016   HGB 15.4 (H) 04/24/2016   HCT 44.5 04/24/2016   MCV 83.8 04/24/2016   PLT 273.0 04/24/2016      Chemistry      Component Value Date/Time   NA 140 05/11/2016 0800   K 4.4 05/11/2016 0800   CL 103 05/11/2016 0800   CO2 27 05/11/2016 0800   BUN 22 05/11/2016 0800   CREATININE 0.80 05/11/2016 0800      Component Value Date/Time   CALCIUM 10.1 05/11/2016 0800   ALKPHOS 77 05/11/2016 0800   AST 22 05/11/2016 0800   ALT 27 05/11/2016 0800   BILITOT 0.6 05/11/2016 0800     IgE = 320  Weight = 166 pounds  A/P: 1. Medication review: patient to start Xolair for asthma today. Dose appropriate. Reviewed the medication with the patient, including the following: Xolair, omalizumab, is a novel IgE blocker.  It appears to reduce rates of hospitalizations, ER visits and unscheduled physician visits due asthma exacerbations when added to standard therapy.  Studies also show a reduction in steroid requirements and improvement in quality of life.  Patient should  always have an EpiPen readily available in the event of anaphylaxis. Reviewed adverse effects and monitoring parameters. No recommendations for any changes at this time.   Nicoletta Ba, PharmD, BCPS, BCACP, [facility] [phone]

## 2016-06-13 NOTE — ED Notes (Signed)
Pt ambulatory and independent at discharge.  Verbalized understanding of discharge instructions 

## 2016-06-13 NOTE — Discharge Instructions (Signed)
Please read attached information. If you experience any new or worsening signs or symptoms please return to the emergency room for evaluation. Please follow-up with your primary care provider or specialist as discussed.  °

## 2016-06-13 NOTE — ED Notes (Signed)
Bed: [id number] Expected date:  Expected time:  Means of arrival:  Comments: 45 yo allergic reaction

## 2016-06-13 NOTE — ED Triage Notes (Signed)
Patient has a allergic reaction to Zolar at MD office and had an allergic reaction.  She has a dry cough, tickle in throat, and full body itching.  Patient denies n/v/, pain, sob, or chest pain.   BP: 190/90 P:80 R:20 O2: 100% on room air

## 2016-06-13 NOTE — Assessment & Plan Note (Signed)
Pt with initial Xolair injection with apparent allergic reaction VSS with no apparent upper obstruction /swelling  She was placed on O2 for comfort. Depo Medrol 120IM x 1 in office  EMS was called and arrived with pt transported to ER .  Pt /husband and support provided.  Pt transported in stable condition.

## 2016-06-13 NOTE — ED Provider Notes (Signed)
[facility] DEPT Provider Note   CSN: [id number] Arrival date & time: 06/13/16  1627     History   Chief Complaint Chief Complaint  Patient presents with  . Allergic Reaction    HPI Bridget Peters is a 45 y.o. female.  HPI   45 year old female presents today with allergic reaction. Patient reports she was over at the pulmonologist receiving her first dose of Xolair for asthma. Patient reports a significant past medical history of asthma exacerbations due to environmental allergens. She reports shortly after administration of medication she started feeling tingling in her throat, itching throughout her entire body. Patient reports she was given Solu-Medrol, was watched at the clinic, but symptoms did not improve so sent here for further evaluation. Patient reports that symptoms have not worsened, she denies any difficulty breathing, chest pain, rash, or any other signs of anaphylactic reaction here. No history of anaphylaxis, no hospital admissions for severe reaction.  Past Medical History:  Diagnosis Date  . Allergy    Seasonal  . Asthma   . Diabetes mellitus without complication ([place])   . Pneumonia   . Thyroid disease     Patient Active Problem List   Diagnosis Date Noted  . Allergic reaction 06/13/2016  . Hypercholesteremia 04/26/2016  . Hypothyroidism 07/14/2015  . Diabetes mellitus type 2, controlled, without complications ([place]) [id number]  . Multiple environmental allergies 07/14/2015  . Asthma, moderate persistent 07/14/2015    Past Surgical History:  Procedure Laterality Date  . BREAST SURGERY  01/2013   Breast Reduction   . NASAL SINUS SURGERY    . NASAL SINUS SURGERY  02/2014  . REDUCTION MAMMAPLASTY Bilateral 2014    OB History    No data available       Home Medications    Prior to Admission medications   Medication Sig Start Date End Date Taking? Authorizing Provider  albuterol (PROVENTIL HFA;VENTOLIN HFA) 108 (90 Base) MCG/ACT inhaler  Inhale 2 puffs into the lungs every 4 (four) hours as needed for wheezing or shortness of breath. 04/04/16  Yes Vishal Mungal, MD  canagliflozin (INVOKANA) 300 MG TABS tablet Take 1 tablet (300 mg total) by mouth daily. 04/24/16  Yes Burnard Hawthorne, FNP  Cholecalciferol 50000 units TABS 50,000 units PO qwk for 8 weeks. 04/25/16  Yes Burnard Hawthorne, FNP  fluticasone (VERAMYST) 27.5 MCG/SPRAY nasal spray Place 2 sprays into the nose daily. 08/06/15  Yes Flora Lipps, MD  fluticasone furoate-vilanterol (BREO ELLIPTA) 100-25 MCG/INH AEPB Inhale 1 puff into the lungs daily. 12/07/15  Yes Wilhelmina Mcardle, MD  levocetirizine Harlow Ohms) 5 MG tablet  10/21/15  Yes Historical Provider, MD  levothyroxine (SYNTHROID, LEVOTHROID) 75 MCG tablet Take 1 tablet (75 mcg total) by mouth daily. 04/24/16  Yes Burnard Hawthorne, FNP  metFORMIN (GLUCOPHAGE XR) 500 MG 24 hr tablet Start 500mg  PO qpm. 04/25/16  Yes Burnard Hawthorne, FNP  montelukast (SINGULAIR) 10 MG tablet Take 1 tablet (10 mg total) by mouth at bedtime. 07/28/15  Yes Rubbie Battiest, NP  omalizumab Arvid Right) 150 MG injection Inject 300 mg into the skin every 14 (fourteen) days. 06/13/16  Yes Tresa Garter, MD  pantoprazole (PROTONIX) 40 MG tablet TAKE 1 TABLET BY MOUTH DAILY. 12/27/15  Yes Flora Lipps, MD  rosuvastatin (CRESTOR) 40 MG tablet Take 1 tablet (40 mg total) by mouth daily. 04/25/16  Yes Burnard Hawthorne, FNP  albuterol (PROVENTIL) (2.5 MG/3ML) 0.083% nebulizer solution Take 3 mLs (2.5 mg total) by nebulization every  6 (six) hours as needed for wheezing or shortness of breath. 12/07/15   Wilhelmina Mcardle, MD    Family History Family History  Problem Relation Age of Onset  . Hypertension Mother   . Hypertension Father   . Cancer Maternal Aunt     Breast Cancer  . Breast cancer Maternal Aunt   . Cancer Paternal Aunt     Breast Cancer  . Cancer Maternal Grandmother     Bladder Cancer  . Stroke Maternal Grandmother   . Breast cancer Maternal  Grandmother   . Cancer Maternal Grandfather     Esophagus Cancer    Social History Social History  Substance Use Topics  . Smoking status: Never Smoker  . Smokeless tobacco: Never Used  . Alcohol use No     Allergies   Shellfish allergy and Xolair [omalizumab]   Review of Systems Review of Systems  All other systems reviewed and are negative.    Physical Exam Updated Vital Signs BP 132/77 (BP Location: Left Arm)   Pulse 73   Temp 98 F (36.7 C) (Oral)   Resp 18   Ht 5\' 2"  (1.575 m)   Wt 68 kg   SpO2 95%   BMI 27.44 kg/m   Physical Exam  Constitutional: She is oriented to person, place, and time. She appears well-developed and well-nourished.  HENT:  Head: Normocephalic and atraumatic.  Mouth/Throat: Uvula is midline, oropharynx is clear and moist and mucous membranes are normal. No oropharyngeal exudate, posterior oropharyngeal edema, posterior oropharyngeal erythema or tonsillar abscesses.  Eyes: Conjunctivae are normal. Pupils are equal, round, and reactive to light. Right eye exhibits no discharge. Left eye exhibits no discharge. No scleral icterus.  Neck: Normal range of motion. No JVD present. No tracheal deviation present.  Cardiovascular: Normal rate and regular rhythm.  Exam reveals no gallop and no friction rub.   No murmur heard. Pulmonary/Chest: Effort normal and breath sounds normal. No stridor. No respiratory distress. She has no wheezes. She has no rales. She exhibits no tenderness.  Abdominal: Soft.  Neurological: She is alert and oriented to person, place, and time. Coordination normal.  Psychiatric: She has a normal mood and affect. Her behavior is normal. Judgment and thought content normal.  Nursing note and vitals reviewed.   ED Treatments / Results  Labs (all labs ordered are listed, but only abnormal results are displayed) Labs Reviewed - No data to display  EKG  EKG Interpretation None       Radiology No results  found.  Procedures Procedures (including critical care time)  Medications Ordered in ED Medications  famotidine (PEPCID) IVPB 20 mg premix (0 mg Intravenous Stopped 06/13/16 1825)     Initial Impression / Assessment and Plan / ED Course  I have reviewed the triage vital signs and the nursing notes.  Pertinent labs & imaging results that were available during my care of the patient were reviewed by me and considered in my medical decision making (see chart for details).  Clinical Course     Final Clinical Impressions(s) / ED Diagnoses   Final diagnoses:  Allergic reaction, initial encounter   Labs:   Imaging:  Consults:  Therapeutics:  Discharge Meds:   Assessment/Plan:  45 year old female presents today with medication reaction. Patient has no acute signs or symptoms of severe reaction, she has clear lung sounds, no signs of intraoral swelling, no rash, no other concerning signs or symptoms. Patient was given Zantac, diphenhydramine here in the ED, and steroids  prior to my arrival. Patient reports complete resolution of symptoms, she was watched for several hours, discharged home with strict return precautions, pulmonology follow-up. She verbalized understanding and agreement today's plan had no further questions or concerns     New Prescriptions Discharge Medication List as of 06/13/2016  7:45 PM       Okey Regal, PA-C 06/14/16 [id number]    Davonna Belling, MD 06/14/16 [id number]

## 2016-06-13 NOTE — Progress Notes (Signed)
Subjective:    Patient ID: Bridget Peters, female    DOB: 13-Nov-1970, 45 y.o.   MRN: [id number]  HPI 45 yo female followed for severe chronic persistent asthma , elevated IgE and eosinophils  Followed by Dr. Mortimer Fries   06/13/2016 Acute OV  Pt presents for an acute office visit. She has been started on Xolair injections and today is her first injection.  Pt was in observation time around 1 hour after injection when she started to feel tickling and tightness in throat. Itching along upper chest , arms and legs. She was brought back to exam room . She says she is not feeling good all over . Dry cough has started . O2 sats were 95%  On room air . She denies chest pain, but feels she has some tightness. She denies fever, n/v/d, abd pain, orthopnea or edema.   Past Medical History:  Diagnosis Date  . Allergy    Seasonal  . Asthma   . Diabetes mellitus without complication ([place])   . Pneumonia   . Thyroid disease    Current Outpatient Prescriptions on File Prior to Visit  Medication Sig Dispense Refill  . albuterol (PROVENTIL HFA;VENTOLIN HFA) 108 (90 Base) MCG/ACT inhaler Inhale 2 puffs into the lungs every 4 (four) hours as needed for wheezing or shortness of breath. 1 Inhaler 6  . albuterol (PROVENTIL) (2.5 MG/3ML) 0.083% nebulizer solution Take 3 mLs (2.5 mg total) by nebulization every 6 (six) hours as needed for wheezing or shortness of breath. 75 mL 12  . canagliflozin (INVOKANA) 300 MG TABS tablet Take 1 tablet (300 mg total) by mouth daily. 90 tablet 3  . Cholecalciferol 50000 units TABS 50,000 units PO qwk for 8 weeks. 8 tablet 0  . fluticasone (VERAMYST) 27.5 MCG/SPRAY nasal spray Place 2 sprays into the nose daily. 10 g 12  . fluticasone furoate-vilanterol (BREO ELLIPTA) 100-25 MCG/INH AEPB Inhale 1 puff into the lungs daily. 60 each 11  . levocetirizine (XYZAL) 5 MG tablet   3  . levothyroxine (SYNTHROID, LEVOTHROID) 75 MCG tablet Take 1 tablet (75 mcg total) by mouth daily. 90  tablet 3  . metFORMIN (GLUCOPHAGE XR) 500 MG 24 hr tablet Start 500mg  PO qpm. 90 tablet 3  . montelukast (SINGULAIR) 10 MG tablet Take 1 tablet (10 mg total) by mouth at bedtime. 30 tablet 1  . omalizumab (XOLAIR) 150 MG injection Inject 300 mg into the skin every 14 (fourteen) days. 4 each 5  . pantoprazole (PROTONIX) 40 MG tablet TAKE 1 TABLET BY MOUTH DAILY. 30 tablet 5  . rosuvastatin (CRESTOR) 40 MG tablet Take 1 tablet (40 mg total) by mouth daily. 90 tablet 3   No current facility-administered medications on file prior to visit.       Review of Systems Constitutional:   No  weight loss, night sweats,  Fevers, chills,  +fatigue, or  lassitude.  HEENT:   No headaches,  Difficulty swallowing,  Tooth/dental problems, or  Sore throat,                No sneezing, itching, ear ache, nasal congestion, post nasal drip,   CV:  No chest pain,  Orthopnea, PND, swelling in lower extremities, anasarca, dizziness, palpitations, syncope.   GI  No heartburn, indigestion, abdominal pain, nausea, vomiting, diarrhea, change in bowel habits, loss of appetite, bloody stools.   Resp:    No chest wall deformity  Skin: no rash or lesions.  GU: no dysuria, change in color  of urine, no urgency or frequency.  No flank pain, no hematuria   MS:  No joint pain or swelling.  No decreased range of motion.  No back pain.  Psych:  No change in mood or affect. No depression or anxiety.  No memory loss.         Objective:   Physical Exam There were no vitals filed for this visit. GEN: A/Ox3; pleasant , NAD, well nourished , anxious    HEENT:  [place]/AT,  EACs-clear, TMs-wnl, NOSE-clear, THROAT-clear, no lesions, no postnasal drip or exudate noted. Tongue midline , no swelling noted.   NECK:  Supple w/ fair ROM; no JVD; normal carotid impulses w/o bruits; no thyromegaly or nodules palpated; no lymphadenopathy.  No stridor   RESP  Clear  P & A; w/o, wheezes/ rales/ or rhonchi. no accessory muscle use, no  dullness to percussion Speaking in full sentences   CARD:  RRR, no m/r/g  , no peripheral edema, pulses intact, no cyanosis or clubbing.  GI:   Soft & nt; nml bowel sounds; no organomegaly or masses detected.   Musco: Warm bil, no deformities or joint swelling noted.   Neuro: alert, no focal deficits noted.    Skin: Warm, redness along upper chest /neck area. No rash noted.  No swelling noted   Tammy Parrett NP-C  [facility]  06/13/2016

## 2016-06-13 NOTE — Patient Instructions (Signed)
Transport to ER via EMS 

## 2016-06-13 NOTE — Telephone Encounter (Signed)
#   Vials:4 Arrival Date:06/13/16 Lot [id number] Exp Date:3/21

## 2016-06-13 NOTE — Telephone Encounter (Addendum)
I had checked on pt. 2 or 3 times already and she said she was fine. I went back, by this time she was an hr. And 30 mins. In. She said it was getting hard to swallow. I asked [place] ( I showed her which pt. ) to say with her while I went and got a provider. Dr. Lenna Gilford was Doc of the day but he was with a pt.Lynelle Smoke Parrett was there and heard  me so she came out to the lobby with me; I introduced them and T.Parrett took her back to be seen. In went by to check on her T. Parrett said she's fine. A few minutes later I look out and see the ambulance. I'm thinking is that for Mrs. Perman, yes. I found out from [place] and Tammy W that she requested EMS. Please refer to T.Parrett's ov notes for further info.Marland Kitchen

## 2016-06-15 ENCOUNTER — Telehealth: Payer: Self-pay | Admitting: Internal Medicine

## 2016-06-15 ENCOUNTER — Encounter: Payer: Self-pay | Admitting: Internal Medicine

## 2016-06-15 MED ORDER — OMALIZUMAB 150 MG ~~LOC~~ SOLR
300.0000 mg | SUBCUTANEOUS | Status: DC
  Administered 2016-06-15: 300 mg via SUBCUTANEOUS

## 2016-06-15 NOTE — Telephone Encounter (Signed)
That's ok.

## 2016-06-15 NOTE — Telephone Encounter (Signed)
Pt aware that work letter will be left up front. Pt states she will come by and pick it up.  I scheduled her 3 mo recall for 07/27/16 with DK.  DK please advise if you would like pt to come in sooner, or if scheduled appointment is okay. Thanks.

## 2016-06-15 NOTE — Telephone Encounter (Signed)
You can write note No other treatment options at this time, will need to discuss at next OV

## 2016-06-15 NOTE — Telephone Encounter (Signed)
Pt spouse called, states pt had a reaction to Xolair, and had to be taken by EMS to the ED. Pt needs a note for work. Please call.

## 2016-06-15 NOTE — Telephone Encounter (Signed)
Spoke with who states she went for her first Xolair injection on 06/13/16. Pt states 45 minutes after injection, she started developing sob, weakness, finger/hands tingling and felt like she couldn't swallow. Pt was given a shot of depo and placed on oxygen in office and was taking over to ED by EMS. Pt has been out of work since Wednesday and would like a note to return back to work on Monday, June 19, 2016 pt would also like to know what the next steps of treatment will be?  DK please advised.

## 2016-06-16 NOTE — Telephone Encounter (Signed)
LM to inform pt of DK recommendation and to advise pt to call us if a sooner appointment is needed. Will await call back

## 2016-06-20 ENCOUNTER — Encounter: Payer: Self-pay | Admitting: Internal Medicine

## 2016-06-21 NOTE — Telephone Encounter (Signed)
LM with DK recommendations. Nothing further needed.

## 2016-07-11 ENCOUNTER — Ambulatory Visit (INDEPENDENT_AMBULATORY_CARE_PROVIDER_SITE_OTHER): Payer: 59 | Admitting: Family

## 2016-07-11 ENCOUNTER — Encounter: Payer: Self-pay | Admitting: Family

## 2016-07-11 VITALS — BP 146/80 | HR 85 | Temp 97.9°F | Ht 62.0 in | Wt 164.6 lb

## 2016-07-11 DIAGNOSIS — F321 Major depressive disorder, single episode, moderate: Secondary | ICD-10-CM | POA: Diagnosis not present

## 2016-07-11 DIAGNOSIS — E785 Hyperlipidemia, unspecified: Secondary | ICD-10-CM | POA: Diagnosis not present

## 2016-07-11 DIAGNOSIS — F32A Depression, unspecified: Secondary | ICD-10-CM | POA: Insufficient documentation

## 2016-07-11 DIAGNOSIS — F329 Major depressive disorder, single episode, unspecified: Secondary | ICD-10-CM | POA: Insufficient documentation

## 2016-07-11 DIAGNOSIS — E119 Type 2 diabetes mellitus without complications: Secondary | ICD-10-CM | POA: Diagnosis not present

## 2016-07-11 DIAGNOSIS — E7800 Pure hypercholesterolemia, unspecified: Secondary | ICD-10-CM | POA: Diagnosis not present

## 2016-07-11 DIAGNOSIS — F419 Anxiety disorder, unspecified: Secondary | ICD-10-CM

## 2016-07-11 LAB — LIPID PANEL
CHOL/HDL RATIO: 2
Cholesterol: 161 mg/dL (ref 0–200)
HDL: 80.5 mg/dL (ref >39.00)
LDL CALC: 66 mg/dL (ref 0–99)
NONHDL: 80.23
TRIGLYCERIDES: 72 mg/dL (ref 0.0–149.0)
VLDL: 14.4 mg/dL (ref 0.0–40.0)

## 2016-07-11 LAB — HEMOGLOBIN A1C: Hgb A1c MFr Bld: 7 % — ABNORMAL HIGH (ref 4.6–6.5)

## 2016-07-11 MED ORDER — SERTRALINE HCL 50 MG PO TABS: 50.0000 mg | ORAL_TABLET | Freq: Every day | ORAL | 0 refills | Status: DC

## 2016-07-11 NOTE — Progress Notes (Signed)
Pre visit review using our clinic review tool, if applicable. No additional management support is needed unless otherwise documented below in the visit note. 

## 2016-07-11 NOTE — Assessment & Plan Note (Signed)
Pending A1c. Continue current regimen.

## 2016-07-11 NOTE — Assessment & Plan Note (Signed)
On crestor. Pending lipid panel.

## 2016-07-11 NOTE — Progress Notes (Signed)
Subjective:    Patient ID: Bridget Peters, female    DOB: [birth year], 45 y.o.   MRN: [id number]  CC: Bridget Peters is a 45 y.o. female who presents today for follow up.   HPI: Patient here for follow up on cholesterol labs and diabetes.   She has been compliant with diabetic medications and crestor. Believes cruise vacation prior to labs this summer elevated A1c and lipids.   Patient also has a new complaint of Depression for several months,worsening. Has been treated in the past with medication however doesn't recall medication. Feels very tearful, trouble sleeping, and not eating well. Stressed from work pressure and husband has been a 'jerk' lately. He is not physically abusive , ' he has a lot on his plate right now with work' as Adult nurse. No thoughts of hurting herself or anyone else.   She notes the her BP is elevated today since she is emotional. She checks it at home and work it is below 140/90.         HISTORY:  Past Medical History:  Diagnosis Date  . Allergy    Seasonal  . Asthma   . Diabetes mellitus without complication ([place])   . Pneumonia   . Thyroid disease    Past Surgical History:  Procedure Laterality Date  . BREAST SURGERY  01/2013   Breast Reduction   . NASAL SINUS SURGERY    . NASAL SINUS SURGERY  02/2014  . REDUCTION MAMMAPLASTY Bilateral 2014   Family History  Problem Relation Age of Onset  . Hypertension Mother   . Hypertension Father   . Cancer Maternal Aunt     Breast Cancer  . Breast cancer Maternal Aunt   . Cancer Paternal Aunt     Breast Cancer  . Cancer Maternal Grandmother     Bladder Cancer  . Stroke Maternal Grandmother   . Breast cancer Maternal Grandmother   . Cancer Maternal Grandfather     Esophagus Cancer    Allergies: Shellfish allergy and Xolair [omalizumab] Current Outpatient Prescriptions on File Prior to Visit  Medication Sig Dispense Refill  . albuterol (PROVENTIL HFA;VENTOLIN HFA) 108 (90  Base) MCG/ACT inhaler Inhale 2 puffs into the lungs every 4 (four) hours as needed for wheezing or shortness of breath. 1 Inhaler 6  . albuterol (PROVENTIL) (2.5 MG/3ML) 0.083% nebulizer solution Take 3 mLs (2.5 mg total) by nebulization every 6 (six) hours as needed for wheezing or shortness of breath. 75 mL 12  . canagliflozin (INVOKANA) 300 MG TABS tablet Take 1 tablet (300 mg total) by mouth daily. 90 tablet 3  . Cholecalciferol 50000 units TABS 50,000 units PO qwk for 8 weeks. 8 tablet 0  . fluticasone (VERAMYST) 27.5 MCG/SPRAY nasal spray Place 2 sprays into the nose daily. 10 g 12  . fluticasone furoate-vilanterol (BREO ELLIPTA) 100-25 MCG/INH AEPB Inhale 1 puff into the lungs daily. 60 each 11  . levocetirizine (XYZAL) 5 MG tablet   3  . levothyroxine (SYNTHROID, LEVOTHROID) 75 MCG tablet Take 1 tablet (75 mcg total) by mouth daily. 90 tablet 3  . metFORMIN (GLUCOPHAGE XR) 500 MG 24 hr tablet Start 500mg  PO qpm. 90 tablet 3  . montelukast (SINGULAIR) 10 MG tablet Take 1 tablet (10 mg total) by mouth at bedtime. 30 tablet 1  . pantoprazole (PROTONIX) 40 MG tablet TAKE 1 TABLET BY MOUTH DAILY. 30 tablet 5  . rosuvastatin (CRESTOR) 40 MG tablet Take 1 tablet (40 mg total) by mouth daily.  90 tablet 3   Current Facility-Administered Medications on File Prior to Visit  Medication Dose Route Frequency Provider Last Rate Last Dose  . omalizumab Geoffry Paradise) injection 300 mg  300 mg Subcutaneous Q14 Days Erin Fulling, MD   300 mg at 06/15/16 1209    Social History  Substance Use Topics  . Smoking status: Never Smoker  . Smokeless tobacco: Never Used  . Alcohol use No    Review of Systems  Constitutional: Negative for chills and fever.  Respiratory: Negative for cough.   Cardiovascular: Negative for chest pain and palpitations.  Gastrointestinal: Negative for nausea and vomiting.  Psychiatric/Behavioral: Positive for sleep disturbance. Negative for suicidal ideas. The patient is not  nervous/anxious.       Objective:    BP (!) 146/80   Pulse 85   Temp 97.9 F (36.6 C) (Oral)   Ht 5\' 2"  (1.575 m)   Wt 164 lb 9.6 oz (74.7 kg)   SpO2 99%   BMI 30.11 kg/m  BP Readings from Last 3 Encounters:  07/11/16 (!) 146/80  06/13/16 132/77  06/13/16 (!) 156/98   Wt Readings from Last 3 Encounters:  07/11/16 164 lb 9.6 oz (74.7 kg)  06/13/16 150 lb (68 kg)  05/08/16 166 lb (75.3 kg)    Physical Exam  Constitutional: She appears well-developed and well-nourished.  Eyes: Conjunctivae are normal.  Cardiovascular: Normal rate, regular rhythm, normal heart sounds and normal pulses.   Pulmonary/Chest: Effort normal and breath sounds normal. She has no wheezes. She has no rhonchi. She has no rales.  Neurological: She is alert.  Skin: Skin is warm and dry.  Psychiatric: She has a normal mood and affect. Her speech is normal and behavior is normal. Thought content normal.  Vitals reviewed.      Assessment & Plan:   Problem List Items Addressed This Visit      Endocrine   Diabetes mellitus type 2, controlled, without complications (HCC)    Pending A1c. Continue current regimen.         Other   Hypercholesteremia    On crestor. Pending lipid panel.       Depression    New. Starting zoloft. F/u 6-8 weeks.       Relevant Medications   sertraline (ZOLOFT) 50 MG tablet    Other Visit Diagnoses    Type 2 diabetes mellitus without complication, without long-term current use of insulin (HCC)    -  Primary   Relevant Orders   Hemoglobin A1c   Hyperlipidemia, unspecified hyperlipidemia type       Relevant Orders   Lipid panel       I have discontinued Ms. Steuber's omalizumab. I am also having her start on sertraline. Additionally, I am having her maintain her montelukast, fluticasone, levocetirizine, albuterol, fluticasone furoate-vilanterol, pantoprazole, albuterol, levothyroxine, canagliflozin, metFORMIN, Cholecalciferol, and rosuvastatin. We will continue  to administer omalizumab.   Meds ordered this encounter  Medications  . sertraline (ZOLOFT) 50 MG tablet    Sig: Take 1 tablet (50 mg total) by mouth at bedtime.    Dispense:  90 tablet    Refill:  0    Order Specific Question:   Supervising Provider    Answer:   Sherlene Shams [[id number]]    Return precautions given.   Risks, benefits, and alternatives of the medications and treatment plan prescribed today were discussed, and patient expressed understanding.   Education regarding symptom management and diagnosis given to patient on AVS.  Continue to  follow with Mable Paris, FNP for routine health maintenance.   Bridget Peters and I agreed with plan.   Mable Paris, FNP  Total of 25 minutes spent with patient, greater than 50% of which was spent in discussion of  Depression and medication treatment.

## 2016-07-11 NOTE — Patient Instructions (Signed)
Our hope is for gradual improvement of mood since starting medication; however this may take several weeks.   If you start to have unusual thoughts, thoughts of hurting yourself, or anyone else, please go immediately to the emergency department.   Follow up in one month.    National Suicide Prevention Hotline - available 24 hours a day, 7 days a week.  ([phone]  Major Depressive Disorder Major depressive disorder is a mental illness. It also may be called clinical depression or unipolar depression. Major depressive disorder usually causes feelings of sadness, hopelessness, or helplessness. Some people with this disorder do not feel particularly sad but lose interest in doing things they used to enjoy (anhedonia). Major depressive disorder also can cause physical symptoms. It can interfere with work, school, relationships, and other normal everyday activities. The disorder varies in severity but is longer lasting and more serious than the sadness we all feel from time to time in our lives. Major depressive disorder often is triggered by stressful life events or major life changes. Examples of these triggers include divorce, loss of your job or home, a move, and the death of a family member or close friend. Sometimes this disorder occurs for no obvious reason at all. People who have family members with major depressive disorder or bipolar disorder are at higher risk for developing this disorder, with or without life stressors. Major depressive disorder can occur at any age. It may occur just once in your life (single episode major depressive disorder). It may occur multiple times (recurrent major depressive disorder). SYMPTOMS People with major depressive disorder have either anhedonia or depressed mood on nearly a daily basis for at least 2 weeks or longer. Symptoms of depressed mood include:  Feelings of sadness (blue or down in the dumps) or emptiness.  Feelings of hopelessness or  helplessness.  Tearfulness or episodes of crying (may be observed by others).  Irritability (children and adolescents). In addition to depressed mood or anhedonia or both, people with this disorder have at least four of the following symptoms:  Difficulty sleeping or sleeping too much.   Significant change (increase or decrease) in appetite or weight.   Lack of energy or motivation.  Feelings of guilt and worthlessness.   Difficulty concentrating, remembering, or making decisions.  Unusually slow movement (psychomotor retardation) or restlessness (as observed by others).   Recurrent wishes for death, recurrent thoughts of self-harm (suicide), or a suicide attempt. People with major depressive disorder commonly have persistent negative thoughts about themselves, other people, and the world. People with severe major depressive disorder may experiencedistorted beliefs or perceptions about the world (psychotic delusions). They also may see or hear things that are not real (psychotic hallucinations). DIAGNOSIS Major depressive disorder is diagnosed through an assessment by your health care provider. Your health care provider will ask aboutaspects of your daily life, such as mood,sleep, and appetite, to see if you have the diagnostic symptoms of major depressive disorder. Your health care provider may ask about your medical history and use of alcohol or drugs, including prescription medicines. Your health care provider also may do a physical exam and blood work. This is because certain medical conditions and the use of certain substances can cause major depressive disorder-like symptoms (secondary depression). Your health care provider also may refer you to a mental health specialist for further evaluation and treatment. TREATMENT It is important to recognize the symptoms of major depressive disorder and seek treatment. The following treatments can be prescribed for this disorder:  Medicine. Antidepressant medicines usually are prescribed. Antidepressant medicines are thought to correct chemical imbalances in the brain that are commonly associated with major depressive disorder. Other types of medicine may be added if the symptoms do not respond to antidepressant medicines alone or if psychotic delusions or hallucinations occur.  Talk therapy. Talk therapy can be helpful in treating major depressive disorder by providing support, education, and guidance. Certain types of talk therapy also can help with negative thinking (cognitive behavioral therapy) and with relationship issues that trigger this disorder (interpersonal therapy). A mental health specialist can help determine which treatment is best for you. Most people with major depressive disorder do well with a combination of medicine and talk therapy. Treatments involving electrical stimulation of the brain can be used in situations with extremely severe symptoms or when medicine and talk therapy do not work over time. These treatments include electroconvulsive therapy, transcranial magnetic stimulation, and vagal nerve stimulation.   This information is not intended to replace advice given to you by your health care provider. Make sure you discuss any questions you have with your health care provider.   Document Released: 01/20/2013 Document Revised: 10/16/2014 Document Reviewed: 01/20/2013 Elsevier Interactive Patient Education [employer].

## 2016-07-11 NOTE — Assessment & Plan Note (Signed)
New. Starting zoloft. F/u 6-8 weeks.

## 2016-07-12 ENCOUNTER — Ambulatory Visit: Payer: 59 | Admitting: Family

## 2016-07-14 ENCOUNTER — Ambulatory Visit: Payer: 59

## 2016-07-17 ENCOUNTER — Ambulatory Visit: Payer: 59

## 2016-07-18 ENCOUNTER — Telehealth: Payer: 59 | Admitting: Family

## 2016-07-18 ENCOUNTER — Ambulatory Visit: Payer: 59

## 2016-07-18 DIAGNOSIS — J309 Allergic rhinitis, unspecified: Secondary | ICD-10-CM

## 2016-07-18 MED ORDER — LEVOCETIRIZINE DIHYDROCHLORIDE 5 MG PO TABS: 5.0000 mg | ORAL_TABLET | Freq: Every evening | ORAL | 1 refills | Status: DC

## 2016-07-18 MED ORDER — FLUTICASONE PROPIONATE 50 MCG/ACT NA SUSP: 2.0000 | Freq: Every day | NASAL | 1 refills | Status: DC

## 2016-07-18 NOTE — Progress Notes (Signed)
E visit for Allergic Rhinitis We are sorry that you are not feeling well.  Her is how we plan to help!  Based on what you have shared with me it looks like you have Allergic Rhinitis.  Rhinitis is when a reaction occurs that causes nasal congestion, runny nose, sneezing, and itching.  Most types of rhinitis are caused by an inflammation and are associated with symptoms in the eyes ears or throat. There are several types of rhinitis.  The most common are acute rhinitis, which is usually caused by a viral illness, allergic or seasonal rhinitis, and nonallergic or year-round rhinitis.  Nasal allergies occur certain times of the year.  Allergic rhinitis is caused when allergens in the air trigger the release of histamine in the body.  Histamine causes itching, swelling, and fluid to build up in the fragile linings of the nasal passages, sinuses and eyelids.  An itchy nose and clear discharge are common.  I recommend the following over the counter treatments: Xyzal 5 mg take 1 tablet daily  I also would recommend a nasal spray: Flonase 2 sprays into each nostril once daily  HOME CARE:   You can use an over-the-counter saline nasal spray as needed  Avoid areas where there is heavy dust, mites, or molds  Stay indoors on windy days during the pollen season  Keep windows closed in home, at least in bedroom; use air conditioner.  Use high-efficiency house air filter  Keep windows closed in car, turn AC on re-circulate  Avoid playing out with dog during pollen season  GET HELP RIGHT AWAY IF:   If your symptoms do not improve within 10 days  You become short of breath  You develop yellow or green discharge from your nose for over 3 days  You have coughing fits  MAKE SURE YOU:   Understand these instructions  Will watch your condition  Will get help right away if you are not doing well or get worse  Thank you for choosing an e-visit. Your e-visit answers were reviewed by a board  certified advanced clinical practitioner to complete your personal care plan. Depending upon the condition, your plan could have included both over the counter or prescription medications. Please review your pharmacy choice. Be sure that the pharmacy you have chosen is open so that you can pick up your prescription now.  If there is a problem you may message your provider in MyChart to have the prescription routed to another pharmacy. Your safety is important to us. If you have drug allergies check your prescription carefully.  For the next 24 hours, you can use MyChart to ask questions about today's visit, request a non-urgent call back, or ask for a work or school excuse from your e-visit provider. You will get an email in the next two days asking about your experience. I hope that your e-visit has been valuable and will speed your recovery.         

## 2016-07-27 ENCOUNTER — Ambulatory Visit (INDEPENDENT_AMBULATORY_CARE_PROVIDER_SITE_OTHER): Payer: 59 | Admitting: Internal Medicine

## 2016-07-27 ENCOUNTER — Encounter: Payer: Self-pay | Admitting: Internal Medicine

## 2016-07-27 VITALS — BP 124/76 | HR 88 | Ht 62.0 in | Wt 163.0 lb

## 2016-07-27 DIAGNOSIS — J4550 Severe persistent asthma, uncomplicated: Secondary | ICD-10-CM

## 2016-07-27 MED ORDER — FLUTICASONE FUROATE-VILANTEROL 200-25 MCG/INH IN AEPB: 1.0000 | INHALATION_SPRAY | Freq: Every day | RESPIRATORY_TRACT | 0 refills | Status: AC

## 2016-07-27 MED ORDER — PREDNISONE 10 MG PO TABS: 10.0000 mg | ORAL_TABLET | Freq: Every day | ORAL | 3 refills | Status: DC

## 2016-07-27 NOTE — Progress Notes (Signed)
[facility] Consultation      Date: 07/27/2016,   MRN# [id number] Bridget Peters 04-Dec-1970 Code Status:  Hosp day:@LENGTHOFSTAYDAYS @ Referring MD: @ATDPROV @     PCP:      AdmissionWeight: 163 lb (73.9 kg)                 CurrentWeight: 163 lb (73.9 kg) Bridget Peters is a 45 y.o. old female seen in consultation for asthma     CHIEF COMPLAINT:   Follow up [place]  Patient restarted  taking BREO seems to be tolerating better, intermittent SOB and coughing and wheezing She has had 2-3 ASTHMA exacerbations in the past 6 months Plan was to take Xolair injections but had reaction to injection  Ige levels 320, Eos count 200  Patient had allergic reaction to Xolair on 06/13/16 had increased SOB, chest tightness and airway swelling Patient was sent to ER for observation  Patient here today and would like to try alternative Immunotherapy She states that she is feeling OK today  We have talked about Nucala and that this may have similar allergic reaction, she is willing to take that chance I have also discussed taking oral prednisone daily and she is willing to try 10 mg daily   IgE levels     MEDICATIONS    Home Medication:   Current Medication:  Current Outpatient Prescriptions:  .  albuterol (PROVENTIL HFA;VENTOLIN HFA) 108 (90 Base) MCG/ACT inhaler, Inhale 2 puffs into the lungs every 4 (four) hours as needed for wheezing or shortness of breath., Disp: 1 Inhaler, Rfl: 6 .  albuterol (PROVENTIL) (2.5 MG/3ML) 0.083% nebulizer solution, Take 3 mLs (2.5 mg total) by nebulization every 6 (six) hours as needed for wheezing or shortness of breath., Disp: 75 mL, Rfl: 12 .  canagliflozin (INVOKANA) 300 MG TABS tablet, Take 1 tablet (300 mg total) by mouth daily., Disp: 90 tablet, Rfl: 3 .  fluticasone (FLONASE) 50 MCG/ACT nasal spray, Place 2 sprays into both nostrils daily., Disp: 16 g, Rfl: 1 .  fluticasone (VERAMYST)  27.5 MCG/SPRAY nasal spray, Place 2 sprays into the nose daily., Disp: 10 g, Rfl: 12 .  fluticasone furoate-vilanterol (BREO ELLIPTA) 100-25 MCG/INH AEPB, Inhale 1 puff into the lungs daily., Disp: 60 each, Rfl: 11 .  levocetirizine (XYZAL) 5 MG tablet, Take 1 tablet (5 mg total) by mouth every evening., Disp: 30 tablet, Rfl: 1 .  levothyroxine (SYNTHROID, LEVOTHROID) 75 MCG tablet, Take 1 tablet (75 mcg total) by mouth daily., Disp: 90 tablet, Rfl: 3 .  metFORMIN (GLUCOPHAGE XR) 500 MG 24 hr tablet, Start 500mg  PO qpm., Disp: 90 tablet, Rfl: 3 .  montelukast (SINGULAIR) 10 MG tablet, Take 1 tablet (10 mg total) by mouth at bedtime., Disp: 30 tablet, Rfl: 1 .  pantoprazole (PROTONIX) 40 MG tablet, TAKE 1 TABLET BY MOUTH DAILY., Disp: 30 tablet, Rfl: 5 .  rosuvastatin (CRESTOR) 40 MG tablet, Take 1 tablet (40 mg total) by mouth daily., Disp: 90 tablet, Rfl: 3 .  sertraline (ZOLOFT) 50 MG tablet, Take 1 tablet (50 mg total) by mouth at bedtime., Disp: 90 tablet, Rfl: 0  Current Facility-Administered Medications:  .  omalizumab Arvid Right) injection 300 mg, 300 mg, Subcutaneous, Q14 Days, Flora Lipps, MD, 300 mg at 06/15/16 1209    ALLERGIES   Shellfish allergy and Xolair [omalizumab]     REVIEW OF SYSTEMS   Review of Systems  Constitutional: Negative for chills, fever, malaise/fatigue and weight  loss.  HENT: Positive for congestion.   Respiratory: Negative for cough, hemoptysis, sputum production, shortness of breath and wheezing.   Cardiovascular: Negative for chest pain.  Endo/Heme/Allergies: Negative.   All other systems reviewed and are negative.    VS: BP 124/76 (BP Location: Left Arm, Cuff Size: Normal)   Pulse 88   Ht 5\' 2"  (1.575 m)   Wt 163 lb (73.9 kg)   SpO2 97%   BMI 29.81 kg/m      PHYSICAL EXAM  Physical Exam  Constitutional: She is oriented to person, place, and time. She appears well-developed and well-nourished. No distress.  HENT:  Head: Normocephalic and  atraumatic.  Mouth/Throat: No oropharyngeal exudate.  Eyes: Pupils are equal, round, and reactive to light.  Neck: Neck supple.  Cardiovascular: Normal rate, regular rhythm and normal heart sounds.   No murmur heard. Pulmonary/Chest: Effort normal and breath sounds normal. No stridor. No respiratory distress. She has no wheezes.  Musculoskeletal: Normal range of motion.  Neurological: She is alert and oriented to person, place, and time.  Skin: Skin is warm. She is not diaphoretic.  Psychiatric: She has a normal mood and affect.     IgE levels=320 Eosinophil count=200  IgE     ASSESSMENT/PLAN   45 yo white female seen today for severe chronic persistent asthma without exacerbation at this time.  With elevated IgE levels and elevated EOS count, with several ASTHMA exacerbations in the past 6 months. Patient meets criteria for Immnuotherapy However, patient had sever reaction to Xolair injections, and despite this reaction, patient has decided to try alternative Immunotherapy  In the meantime, while we assess trying Nucala, will prescribe daily prednisone 10 mg  1.avoid all triggers 2.increase BREO to 200 3.albuterol as needed 4.continue PPI 5.continue veramyst 6.continue singulair/zyrtec 7.Referal to Immunotherapy with Nucala 8.start oral prednisone 10 mg daily  Follow up in 3 months  The Patient requires high complexity decision making for assessment and support, frequent evaluation and titration of therapies, application of advanced monitoring technologies and extensive interpretation of multiple databases.   Patient satisfied with Plan of action and management. All questions answered  [name], M.D.  [facility]  Medical Director [place] Director [facility] Cardio-Pulmonary Department

## 2016-07-27 NOTE — Patient Instructions (Signed)
Continue BREO wil lincrease to 200 Will assess for Nucala immunotherapy for persistent ASTHMA Albuterol as needed   Asthma Attack Prevention While you may not be able to control the fact that you have asthma, you can take actions to prevent asthma attacks. The best way to prevent asthma attacks is to maintain good control of your asthma. You can achieve this by:  Taking your medicines as directed.  Avoiding things that can irritate your airways or make your asthma symptoms worse (asthma triggers).  Keeping track of how well your asthma is controlled and of any changes in your symptoms.  Responding quickly to worsening asthma symptoms (asthma attack).  Seeking emergency care when it is needed. WHAT ARE SOME WAYS TO PREVENT AN ASTHMA ATTACK? Have a Plan Work with your health care provider to create a written plan for managing and treating your asthma attacks (asthma action plan). This plan includes:  A list of your asthma triggers and how you can avoid them.  Information on when medicines should be taken and when their dosages should be changed.  The use of a device that measures how well your lungs are working (peak flow meter). Monitor Your Asthma Use your peak flow meter and record your results in a journal every day. A drop in your peak flow numbers on one or more days may indicate the start of an asthma attack. This can happen even before you start to feel symptoms. You can prevent an asthma attack from getting worse by following the steps in your asthma action plan. Avoid Asthma Triggers Work with your asthma health care provider to find out what your asthma triggers are. This can be done by:  Allergy testing.  Keeping a journal that notes when asthma attacks occur and the factors that may have contributed to them.  Determining if there are other medical conditions that are making your asthma worse. Once you have determined your asthma triggers, take steps to avoid them. This  may include avoiding excessive or prolonged exposure to:  Dust. Have someone dust and vacuum your home for you once or twice a week. Using a high-efficiency particulate arrestance (HEPA) vacuum is best.  Smoke. This includes campfire smoke, forest fire smoke, and secondhand smoke from tobacco products.  Pet dander. Avoid contact with animals that you know you are allergic to.  Allergens from trees, grasses or pollens. Avoid spending a lot of time outdoors when pollen counts are high, and on very windy days.  Very cold, dry, or humid air.  Mold.  Foods that contain high amounts of sulfites.  Strong odors.  Outdoor air pollutants, such as Lexicographer.  Indoor air pollutants, such as aerosol sprays and fumes from household cleaners.  Household pests, including dust mites and cockroaches, and pest droppings.  Certain medicines, including NSAIDs. Always talk to your health care provider before stopping or starting any new medicines. Medicines Take over-the-counter and prescription medicines only as told by your health care provider. Many asthma attacks can be prevented by carefully following your medicine schedule. Taking your medicines correctly is especially important when you cannot avoid certain asthma triggers. Act Quickly If an asthma attack does happen, acting quickly can decrease how severe it is and how long it lasts. Take these steps:   Pay attention to your symptoms. If you are coughing, wheezing, or having difficulty breathing, do not wait to see if your symptoms go away on their own. Follow your asthma action plan.  If you have followed your  asthma action plan and your symptoms are not improving, call your health care provider or seek immediate medical care at the nearest hospital. It is important to note how often you need to use your fast-acting rescue inhaler. If you are using your rescue inhaler more often, it may mean that your asthma is not under control. Adjusting  your asthma treatment plan may help you to prevent future asthma attacks and help you to gain better control of your condition. HOW CAN I PREVENT AN ASTHMA ATTACK WHEN I EXERCISE? Follow advice from your health care provider about whether you should use your fast-acting inhaler before exercising. Many people with asthma experience exercise-induced bronchoconstriction (EIB). This condition often worsens during vigorous exercise in cold, humid, or dry environments. Usually, people with EIB can stay very active by pre-treating with a fast-acting inhaler before exercising.   This information is not intended to replace advice given to you by your health care provider. Make sure you discuss any questions you have with your health care provider.   Document Released: 09/13/2009 Document Revised: 06/16/2015 Document Reviewed: 02/25/2015 Elsevier Interactive Patient Education [employer].

## 2016-07-27 NOTE — Addendum Note (Signed)
Addended by: Maryanna Shape A on: 07/27/2016 10:02 AM   Modules accepted: Orders

## 2016-08-07 ENCOUNTER — Encounter: Payer: Self-pay | Admitting: *Deleted

## 2016-08-10 ENCOUNTER — Other Ambulatory Visit: Payer: Self-pay | Admitting: Internal Medicine

## 2016-08-12 ENCOUNTER — Encounter: Payer: Self-pay | Admitting: Internal Medicine

## 2016-08-14 ENCOUNTER — Telehealth: Payer: 59 | Admitting: Family

## 2016-08-14 DIAGNOSIS — J0100 Acute maxillary sinusitis, unspecified: Secondary | ICD-10-CM

## 2016-08-14 MED ORDER — AMOXICILLIN-POT CLAVULANATE 875-125 MG PO TABS: 1.0000 | ORAL_TABLET | Freq: Two times a day (BID) | ORAL | 0 refills | Status: DC

## 2016-08-14 NOTE — Progress Notes (Signed)

## 2016-08-16 ENCOUNTER — Other Ambulatory Visit: Payer: Self-pay | Admitting: *Deleted

## 2016-08-16 MED ORDER — MONTELUKAST SODIUM 10 MG PO TABS: 10.0000 mg | ORAL_TABLET | Freq: Every day | ORAL | 5 refills | Status: DC

## 2016-08-16 MED ORDER — CETIRIZINE HCL 10 MG PO TABS: 10.0000 mg | ORAL_TABLET | Freq: Every day | ORAL | 5 refills | Status: DC

## 2016-08-22 DIAGNOSIS — J3081 Allergic rhinitis due to animal (cat) (dog) hair and dander: Secondary | ICD-10-CM | POA: Diagnosis not present

## 2016-08-22 DIAGNOSIS — Z91013 Allergy to seafood: Secondary | ICD-10-CM | POA: Diagnosis not present

## 2016-08-22 DIAGNOSIS — J3089 Other allergic rhinitis: Secondary | ICD-10-CM | POA: Diagnosis not present

## 2016-08-22 DIAGNOSIS — J45909 Unspecified asthma, uncomplicated: Secondary | ICD-10-CM | POA: Diagnosis not present

## 2016-08-22 DIAGNOSIS — J302 Other seasonal allergic rhinitis: Secondary | ICD-10-CM | POA: Diagnosis not present

## 2016-08-22 DIAGNOSIS — J301 Allergic rhinitis due to pollen: Secondary | ICD-10-CM | POA: Diagnosis not present

## 2016-08-22 DIAGNOSIS — J4540 Moderate persistent asthma, uncomplicated: Secondary | ICD-10-CM | POA: Diagnosis not present

## 2016-08-22 DIAGNOSIS — H1013 Acute atopic conjunctivitis, bilateral: Secondary | ICD-10-CM | POA: Diagnosis not present

## 2016-08-24 ENCOUNTER — Encounter: Payer: Self-pay | Admitting: Family

## 2016-09-04 ENCOUNTER — Telehealth: Payer: 59 | Admitting: Family

## 2016-09-04 DIAGNOSIS — M5441 Lumbago with sciatica, right side: Secondary | ICD-10-CM

## 2016-09-04 MED ORDER — NAPROXEN 500 MG PO TABS: 500.0000 mg | ORAL_TABLET | Freq: Two times a day (BID) | ORAL | 0 refills | Status: DC

## 2016-09-04 MED ORDER — BACLOFEN 10 MG PO TABS: 10.0000 mg | ORAL_TABLET | Freq: Three times a day (TID) | ORAL | 0 refills | Status: DC

## 2016-09-04 NOTE — Progress Notes (Signed)

## 2016-10-03 ENCOUNTER — Other Ambulatory Visit: Payer: Self-pay | Admitting: Family

## 2016-10-03 DIAGNOSIS — F321 Major depressive disorder, single episode, moderate: Secondary | ICD-10-CM

## 2016-10-06 ENCOUNTER — Other Ambulatory Visit: Payer: Self-pay | Admitting: Family

## 2016-10-06 DIAGNOSIS — F321 Major depressive disorder, single episode, moderate: Secondary | ICD-10-CM

## 2016-10-10 MED ORDER — SERTRALINE HCL 50 MG PO TABS: 50.0000 mg | ORAL_TABLET | Freq: Every day | ORAL | 0 refills | Status: DC

## 2016-10-16 ENCOUNTER — Telehealth: Payer: 59 | Admitting: Family

## 2016-10-16 DIAGNOSIS — J019 Acute sinusitis, unspecified: Secondary | ICD-10-CM | POA: Diagnosis not present

## 2016-10-16 MED ORDER — AMOXICILLIN-POT CLAVULANATE 875-125 MG PO TABS: 1.0000 | ORAL_TABLET | Freq: Two times a day (BID) | ORAL | 0 refills | Status: DC

## 2016-10-16 MED ORDER — BENZONATATE 100 MG PO CAPS: 100.0000 mg | ORAL_CAPSULE | Freq: Two times a day (BID) | ORAL | 0 refills | Status: DC | PRN

## 2016-10-16 NOTE — Progress Notes (Signed)
We are sorry that you are not feeling well.  Here is how we plan to help!  Based on what you have shared with me it looks like you have sinusitis.  Sinusitis is inflammation and infection in the sinus cavities of the head.  Based on your presentation I believe you most likely have Acute Bacterial Sinusitis.  This is an infection caused by bacteria and is treated with antibiotics. I have prescribed Augmentin 875mg /125mg  one tablet twice daily with food, for 7 days. You may use an oral decongestant such as Mucinex D or if you have glaucoma or high blood pressure use plain Mucinex. Saline nasal spray help and can safely be used as often as needed for congestion.  If you develop worsening sinus pain, fever or notice severe headache and vision changes, or if symptoms are not better after completion of antibiotic, please schedule an appointment with a health care provider.    In addition you may use A non-prescription cough medication called Robitussin DAC. Take 2 teaspoons every 8 hours or Delsym: take 2 teaspoons every 12 hours., A non-prescription cough medication called Mucinex DM: take 2 tablets every 12 hours. and A prescription cough medication called Tessalon Perles 100mg . You may take 1-2 capsules every 8 hours as needed for your cough.  Sinus infections are not as easily transmitted as other respiratory infection, however we still recommend that you avoid close contact with loved ones, especially the very young and elderly.  Remember to wash your hands thoroughly throughout the day as this is the number one way to prevent the spread of infection!  Home Care:  Only take medications as instructed by your medical team.  Complete the entire course of an antibiotic.  Do not take these medications with alcohol.  A steam or ultrasonic humidifier can help congestion.  You can place a towel over your head and breathe in the steam from hot water coming from a faucet.  Avoid close contacts especially the  very young and the elderly.  Cover your mouth when you cough or sneeze.  Always remember to wash your hands.  Get Help Right Away If:  You develop worsening fever or sinus pain.  You develop a severe head ache or visual changes.  Your symptoms persist after you have completed your treatment plan.  Make sure you  Understand these instructions.  Will watch your condition.  Will get help right away if you are not doing well or get worse.  Your e-visit answers were reviewed by a board certified advanced clinical practitioner to complete your personal care plan.  Depending on the condition, your plan could have included both over the counter or prescription medications.  If there is a problem please reply  once you have received a response from your provider.  Your safety is important to [place].  If you have drug allergies check your prescription carefully.    You can use MyChart to ask questions about today's visit, request a non-urgent call back, or ask for a work or school excuse for 24 hours related to this e-Visit. If it has been greater than 24 hours you will need to follow up with your provider, or enter a new e-Visit to address those concerns.  You will get an e-mail in the next two days asking about your experience.  I hope that your e-visit has been valuable and will speed your recovery. Thank you for using e-visits.

## 2016-10-20 ENCOUNTER — Other Ambulatory Visit: Payer: Self-pay | Admitting: *Deleted

## 2016-10-20 MED ORDER — MEPOLIZUMAB 100 MG ~~LOC~~ SOLR: 100.0000 mg | SUBCUTANEOUS | 5 refills | Status: DC

## 2016-11-07 ENCOUNTER — Encounter: Payer: Self-pay | Admitting: Internal Medicine

## 2016-11-08 ENCOUNTER — Other Ambulatory Visit: Payer: Self-pay | Admitting: *Deleted

## 2016-11-08 MED ORDER — FLUTICASONE FUROATE-VILANTEROL 200-25 MCG/INH IN AEPB: 1.0000 | INHALATION_SPRAY | Freq: Every day | RESPIRATORY_TRACT | 5 refills | Status: DC

## 2016-11-13 ENCOUNTER — Encounter: Payer: Self-pay | Admitting: Family

## 2016-11-13 DIAGNOSIS — G4733 Obstructive sleep apnea (adult) (pediatric): Secondary | ICD-10-CM | POA: Diagnosis not present

## 2016-11-13 DIAGNOSIS — R490 Dysphonia: Secondary | ICD-10-CM | POA: Diagnosis not present

## 2016-11-13 DIAGNOSIS — J383 Other diseases of vocal cords: Secondary | ICD-10-CM | POA: Diagnosis not present

## 2016-11-13 DIAGNOSIS — J4530 Mild persistent asthma, uncomplicated: Secondary | ICD-10-CM | POA: Diagnosis not present

## 2016-11-14 ENCOUNTER — Encounter: Payer: Self-pay | Admitting: Adult Health

## 2016-11-14 ENCOUNTER — Encounter: Payer: Self-pay | Admitting: Family

## 2016-11-14 ENCOUNTER — Telehealth: Payer: 59 | Admitting: Family

## 2016-11-14 DIAGNOSIS — R6889 Other general symptoms and signs: Secondary | ICD-10-CM

## 2016-11-14 MED ORDER — OSELTAMIVIR PHOSPHATE 75 MG PO CAPS: 75.0000 mg | ORAL_CAPSULE | Freq: Two times a day (BID) | ORAL | 0 refills | Status: DC

## 2016-11-14 NOTE — Progress Notes (Signed)
E visit for Flu like symptoms   We are sorry that you are not feeling well.  Here is how we plan to help! Based on what you have shared with me it looks like you may have a respiratory virus that may be influenza.  Influenza or "the flu" is   an infection caused by a respiratory virus. The flu virus is highly contagious and persons who did not receive their yearly flu vaccination may "catch" the flu from close contact.  We have anti-viral medications to treat the viruses that cause this infection. They are not a "cure" and only shorten the course of the infection. These prescriptions are most effective when they are given within the first 2 days of "flu" symptoms. Antiviral medication are indicated if you have a high risk of complications from the flu. You should  also consider an antiviral medication if you are in close contact with someone who is at risk. These medications can help patients avoid complications from the flu  but have side effects that you should know. Possible side effects from Tamiflu or oseltamivir include nausea, vomiting, diarrhea, dizziness, headaches, eye redness, sleep problems or other respiratory symptoms.  Tamiflu sent to the pharmacy.   You should not take Tamiflu if you have an allergy to oseltamivir or any to the ingredients in Tamiflu.  Based upon your symptoms and potential risk factors I recommend that you follow the flu symptoms recommendation that I have listed below.  ANYONE WHO HAS FLU SYMPTOMS SHOULD: . Stay home. The flu is highly contagious and going out or to work exposes others! . Be sure to drink plenty of fluids. Water is fine as well as fruit juices, sodas and electrolyte beverages. You may want to stay away from caffeine or alcohol. If you are nauseated, try taking small sips of liquids. How do you know if you are getting enough fluid? Your urine should be a pale yellow or almost colorless. . Get rest. . Taking a steamy shower or using a humidifier  may help nasal congestion and ease sore throat pain. Using a saline nasal spray works much the same way. . Cough drops, hard candies and sore throat lozenges may ease your cough. . Line up a caregiver. Have someone check on you regularly.   GET HELP RIGHT AWAY IF: . You cannot keep down liquids or your medications. . You become short of breath . Your fell like you are going to pass out or loose consciousness. . Your symptoms persist after you have completed your treatment plan MAKE SURE YOU   Understand these instructions.  Will watch your condition.  Will get help right away if you are not doing well or get worse.  Your e-visit answers were reviewed by a board certified advanced clinical practitioner to complete your personal care plan.  Depending on the condition, your plan could have included both over the counter or prescription medications.  If there is a problem please reply  once you have received a response from your provider.  Your safety is important to [place].  If you have drug allergies check your prescription carefully.    You can use MyChart to ask questions about today's visit, request a non-urgent call back, or ask for a work or school excuse for 24 hours related to this e-Visit. If it has been greater than 24 hours you will need to follow up with your provider, or enter a new e-Visit to address those concerns.  You will get an  e-mail in the next two days asking about your experience.  I hope that your e-visit has been valuable and will speed your recovery. Thank you for using e-visits.

## 2016-11-15 ENCOUNTER — Telehealth: Payer: Self-pay

## 2016-11-15 ENCOUNTER — Encounter: Payer: Self-pay | Admitting: Family

## 2016-11-15 ENCOUNTER — Telehealth: Payer: Self-pay | Admitting: *Deleted

## 2016-11-15 DIAGNOSIS — J383 Other diseases of vocal cords: Secondary | ICD-10-CM | POA: Insufficient documentation

## 2016-11-15 NOTE — Telephone Encounter (Signed)
Left message for patient to return call back to schedule a nurse visit for injection, pneumovax 23.

## 2016-11-15 NOTE — Telephone Encounter (Signed)
Pt requested a call  Pt contact [phone]

## 2016-11-16 ENCOUNTER — Telehealth: Payer: 59 | Admitting: Physician Assistant

## 2016-11-16 ENCOUNTER — Ambulatory Visit: Payer: 59 | Admitting: Internal Medicine

## 2016-11-16 DIAGNOSIS — R509 Fever, unspecified: Secondary | ICD-10-CM

## 2016-11-16 DIAGNOSIS — R0602 Shortness of breath: Secondary | ICD-10-CM

## 2016-11-16 NOTE — Progress Notes (Signed)
Based on what you shared with me it looks like you have a serious condition that should be evaluated in a face to face office visit.  You are endorsing worsening/continued symptoms with shortness of breath. You also have a history of asthma. You need a visit in person for a good lung examination, assessment of oxygen levels and potentially an x-ray to r/o pneumonia.   NOTE: Even if you have entered your credit card information for this eVisit, you will not be charged.   If you are having a true medical emergency please call 911.  If you need an urgent face to face visit, [facility] has four urgent care centers for your convenience.  If you need care fast and have a high deductible or no insurance consider:   [email or web address]  [phone]  [address], [place], [place] [address] 8 am to 8 pm Monday-Friday 10 am to 4 pm Saturday-Sunday   The following sites will take your  insurance:    . [facility] [place] a Provider at this Location  [address] [place], [place] [address] . 10 am to 8 pm Monday-Friday . 12 pm to 8 pm Saturday-Sunday   . [facility] [place] a Provider at this Location  [address], [place], [place] [address] . 8 am to 8 pm Monday-Friday . 9 am to 6 pm Saturday . 11 am to 6 pm Sunday   . [facility] [place] Get Driving Directions  [id number] [place], [place] [address] . 8 am to 8 pm Monday-Friday . 8 am to 4 pm Saturday-Sunday   Your e-visit answers were reviewed by a board certified advanced clinical practitioner to complete your personal care plan.  Thank you for using e-Visits.

## 2016-11-17 ENCOUNTER — Encounter: Payer: Self-pay | Admitting: *Deleted

## 2016-11-17 NOTE — Telephone Encounter (Signed)
Left message for patient to return call back.  

## 2016-11-22 ENCOUNTER — Telehealth: Payer: Self-pay | Admitting: Family

## 2016-11-22 NOTE — Telephone Encounter (Signed)
Left message to call.

## 2016-11-22 NOTE — Telephone Encounter (Signed)
Call pt-  Have fmla paperwork.   What is medical diagnosis?   Is this from URI or depression?  What dates?   Continuous or intermittant leave?

## 2016-11-24 NOTE — Telephone Encounter (Signed)
Left message to return call back. 

## 2016-11-24 NOTE — Telephone Encounter (Signed)
Spoken to patient, provider has note on paperwork for patient.

## 2016-11-27 ENCOUNTER — Encounter: Payer: Self-pay | Admitting: Family

## 2016-12-14 DIAGNOSIS — G4733 Obstructive sleep apnea (adult) (pediatric): Secondary | ICD-10-CM | POA: Diagnosis not present

## 2016-12-26 ENCOUNTER — Encounter: Payer: Self-pay | Admitting: Internal Medicine

## 2016-12-28 ENCOUNTER — Encounter: Payer: Self-pay | Admitting: Family

## 2016-12-31 NOTE — Progress Notes (Signed)
Subjective:    Patient ID: Bridget Peters, female    DOB: 08/22/1971, 46 y.o.   MRN: [id number]  CC: Siara E Amadi is a 46 y.o. female who presents today for follow up.   HPI:   Multiple complaints  DM fasting BS 80 in morning. No lows. Takes invokana and metformin with food.   Blood pressure elevated.D enies exertional chest pain or pressure, numbness or tingling radiating to left arm or jaw, palpitations, dizziness, frequent headaches, changes in vision, or shortness of breath.    Has been having fatigue for past month. Not sleeping well. Had sleep study couple of weeks ago ( unable to see results) and awaiting results. Had hot and cold sweats.   Depression- zoloft has helped tremendously. Hasn't noticed a difference with sleep.   Clear nasal =sinus congestion and sinus tenderness 2 days ago. Unchanged. No fever. Has tried aleve-D with some relief.   Asthma well controlled. Triggered by seasonal allergies.   Right thigh pain when walking past couple of weeks, waxing and waning. No improvement with NSAID. No injury. Right knee doesn't bother her.        HISTORY:  Past Medical History:  Diagnosis Date  . Allergy    Seasonal  . Asthma   . Diabetes mellitus without complication ([place])   . Pneumonia   . Thyroid disease    Past Surgical History:  Procedure Laterality Date  . BREAST SURGERY  01/2013   Breast Reduction   . NASAL SINUS SURGERY    . NASAL SINUS SURGERY  02/2014  . REDUCTION MAMMAPLASTY Bilateral 2014   Family History  Problem Relation Age of Onset  . Hypertension Mother   . Hypertension Father   . Cancer Maternal Aunt     Breast Cancer  . Breast cancer Maternal Aunt   . Cancer Paternal Aunt     Breast Cancer  . Cancer Maternal Grandmother     Bladder Cancer  . Stroke Maternal Grandmother   . Breast cancer Maternal Grandmother   . Cancer Maternal Grandfather     Esophagus Cancer    Allergies: Shellfish allergy and Xolair  [omalizumab] Current Outpatient Prescriptions on File Prior to Visit  Medication Sig Dispense Refill  . albuterol (PROVENTIL HFA;VENTOLIN HFA) 108 (90 Base) MCG/ACT inhaler Inhale 2 puffs into the lungs every 4 (four) hours as needed for wheezing or shortness of breath. 1 Inhaler 6  . albuterol (PROVENTIL) (2.5 MG/3ML) 0.083% nebulizer solution Take 3 mLs (2.5 mg total) by nebulization every 6 (six) hours as needed for wheezing or shortness of breath. 75 mL 12  . amoxicillin-clavulanate (AUGMENTIN) 875-125 MG tablet Take 1 tablet by mouth 2 (two) times daily. 14 tablet 0  . baclofen (LIORESAL) 10 MG tablet Take 1 tablet (10 mg total) by mouth 3 (three) times daily. 30 each 0  . benzonatate (TESSALON) 100 MG capsule Take 1 capsule (100 mg total) by mouth 2 (two) times daily as needed for cough. 20 capsule 0  . canagliflozin (INVOKANA) 300 MG TABS tablet Take 1 tablet (300 mg total) by mouth daily. 90 tablet 3  . cetirizine (ZYRTEC) 10 MG tablet Take 1 tablet (10 mg total) by mouth daily. 30 tablet 5  . fluticasone (FLONASE) 50 MCG/ACT nasal spray Place 2 sprays into both nostrils daily. 16 g 1  . fluticasone (VERAMYST) 27.5 MCG/SPRAY nasal spray Place 2 sprays into the nose daily. 10 g 12  . fluticasone furoate-vilanterol (BREO ELLIPTA) 200-25 MCG/INH AEPB Inhale 1 puff  into the lungs daily. 60 each 5  . levocetirizine (XYZAL) 5 MG tablet Take 1 tablet (5 mg total) by mouth every evening. 30 tablet 1  . levothyroxine (SYNTHROID, LEVOTHROID) 75 MCG tablet Take 1 tablet (75 mcg total) by mouth daily. 90 tablet 3  . Mepolizumab (NUCALA) 100 MG SOLR Inject 100 mg into the skin every 30 (thirty) days. 1 each 5  . metFORMIN (GLUCOPHAGE XR) 500 MG 24 hr tablet Start 500mg  PO qpm. 90 tablet 3  . montelukast (SINGULAIR) 10 MG tablet Take 1 tablet (10 mg total) by mouth at bedtime. 30 tablet 5  . naproxen (NAPROSYN) 500 MG tablet Take 1 tablet (500 mg total) by mouth 2 (two) times daily with a meal. 30 tablet  0  . oseltamivir (TAMIFLU) 75 MG capsule Take 1 capsule (75 mg total) by mouth 2 (two) times daily. 10 capsule 0  . pantoprazole (PROTONIX) 40 MG tablet TAKE 1 TABLET BY MOUTH DAILY. 30 tablet 5  . predniSONE (DELTASONE) 10 MG tablet Take 1 tablet (10 mg total) by mouth daily with breakfast. 30 tablet 3  . rosuvastatin (CRESTOR) 40 MG tablet Take 1 tablet (40 mg total) by mouth daily. 90 tablet 3   Current Facility-Administered Medications on File Prior to Visit  Medication Dose Route Frequency Provider Last Rate Last Dose  . omalizumab Arvid Right) injection 300 mg  300 mg Subcutaneous Q14 Days Flora Lipps, MD   300 mg at 06/15/16 1209    Social History  Substance Use Topics  . Smoking status: Never Smoker  . Smokeless tobacco: Never Used  . Alcohol use No                                                                                                                                                                                                                                           Review of Systems  Constitutional: Positive for fatigue. Negative for chills and fever.  HENT: Positive for congestion, ear pain (right) and sinus pain. Negative for sneezing and sore throat.   Respiratory: Negative for cough, shortness of breath and wheezing.   Cardiovascular: Negative for chest pain and palpitations.  Gastrointestinal: Negative for nausea and vomiting.  Endocrine: Positive for cold intolerance and heat intolerance.      Objective:    BP (!) 182/110   Pulse 89   Temp 98.1 F (36.7 C) (  Oral)   Ht 5\' 2"  (1.575 m)   Wt 165 lb (74.8 kg)   SpO2 100%   BMI 30.18 kg/m  BP Readings from Last 3 Encounters:  01/01/17 (!) 182/110  07/27/16 124/76  07/11/16 (!) 146/80   Wt Readings from Last 3 Encounters:  01/01/17 165 lb (74.8 kg)  07/27/16 163 lb (73.9 kg)  07/11/16 164 lb 9.6 oz (74.7 kg)    Physical Exam  Constitutional: She appears well-developed and well-nourished.  HENT:   Head: Normocephalic and atraumatic.  Right Ear: Hearing, tympanic membrane, external ear and ear canal normal. No drainage, swelling or tenderness. No foreign bodies. Tympanic membrane is not erythematous and not bulging. No middle ear effusion. No decreased hearing is noted.  Left Ear: Hearing, tympanic membrane, external ear and ear canal normal. No drainage, swelling or tenderness. No foreign bodies. Tympanic membrane is not erythematous and not bulging.  No middle ear effusion. No decreased hearing is noted.  Nose: Nose normal. No rhinorrhea. Right sinus exhibits no maxillary sinus tenderness and no frontal sinus tenderness. Left sinus exhibits no maxillary sinus tenderness and no frontal sinus tenderness.  Mouth/Throat: Uvula is midline, oropharynx is clear and moist and mucous membranes are normal. No oropharyngeal exudate, posterior oropharyngeal edema, posterior oropharyngeal erythema or tonsillar abscesses.  Eyes: Conjunctivae are normal.  Neck: No thyroid mass and no thyromegaly present.  Cardiovascular: Regular rhythm, normal heart sounds and normal pulses.   Pulmonary/Chest: Effort normal and breath sounds normal. She has no wheezes. She has no rhonchi. She has no rales.  Musculoskeletal:       Right upper leg: She exhibits no tenderness, no bony tenderness, no swelling, no edema, no deformity and no laceration.  Lymphadenopathy:       Head (right side): No submental, no submandibular, no tonsillar, no preauricular, no posterior auricular and no occipital adenopathy present.       Head (left side): No submental, no submandibular, no tonsillar, no preauricular, no posterior auricular and no occipital adenopathy present.    She has no cervical adenopathy.  Neurological: She is alert.  Skin: Skin is warm and dry.  Psychiatric: She has a normal mood and affect. Her speech is normal and behavior is normal. Thought content normal.  Vitals reviewed.      Assessment & Plan:   Problem  List Items Addressed This Visit      Respiratory   Asthma, moderate persistent    Well controlled        Endocrine   Hypothyroidism    Suspect may be playing a role of fatigue, temperature intolerances. Pending thyroid studies      Diabetes mellitus type 2, controlled, without complications ([place])    Appears controlled. Pending a1c      Relevant Medications   lisinopril (PRINIVIL,ZESTRIL) 5 MG tablet   Other Relevant Orders   Hemoglobin A1c     Other   Depression    Doing well on zoloft. Will increase to see if helps with sleep.       Relevant Medications   sertraline (ZOLOFT) 100 MG tablet   lisinopril (PRINIVIL,ZESTRIL) 5 MG tablet   Fatigue - Primary    Multifactorial; not sleeping well. Increased zoloft. Current sinus congestion. Pending lab studies. Advised patient if labs unrevealing, she needs to follow up in the next month.       Relevant Medications   sertraline (ZOLOFT) 100 MG tablet   Other Relevant Orders   Hemoglobin A1c   Comprehensive metabolic panel  VITAMIN D 25 Hydroxy (Vit-D Deficiency, Fractures)   TSH   T4, free   CBC with Differential/Platelet   T3, free   Right thigh pain    New. Nonspecific bone pain. Benign exam. Pending XR and CBC.       Relevant Orders   DG FEMUR, MIN 2 VIEWS RIGHT   Elevated blood pressure reading    Elevated. No SOB, CP. Start lisionpril. Recheck bmp in 4 days.      Relevant Medications   lisinopril (PRINIVIL,ZESTRIL) 5 MG tablet   Other Relevant Orders   Basic metabolic panel       I have changed Ms. Borin's sertraline. I am also having her start on lisinopril. Additionally, I am having her maintain her fluticasone, albuterol, albuterol, levothyroxine, canagliflozin, metFORMIN, rosuvastatin, levocetirizine, fluticasone, predniSONE, pantoprazole, cetirizine, montelukast, baclofen, naproxen, amoxicillin-clavulanate, benzonatate, Mepolizumab, fluticasone furoate-vilanterol, and oseltamivir. We will continue  to administer omalizumab.   Meds ordered this encounter  Medications  . sertraline (ZOLOFT) 100 MG tablet    Sig: Take 1 tablet (100 mg total) by mouth at bedtime.    Dispense:  90 tablet    Refill:  1    Order Specific Question:   Supervising Provider    Answer:   Deborra Medina L [[id number]]  . lisinopril (PRINIVIL,ZESTRIL) 5 MG tablet    Sig: Take 1 tablet (5 mg total) by mouth daily.    Dispense:  90 tablet    Refill:  3    Order Specific Question:   Supervising Provider    Answer:   Crecencio Mc [[id number]]    Return precautions given.   Risks, benefits, and alternatives of the medications and treatment plan prescribed today were discussed, and patient expressed understanding.   Education regarding symptom management and diagnosis given to patient on AVS.  Continue to follow with Mable Paris, FNP for routine health maintenance.   Natasa E Cobbins and I agreed with plan.   Mable Paris, FNP

## 2017-01-01 ENCOUNTER — Ambulatory Visit (INDEPENDENT_AMBULATORY_CARE_PROVIDER_SITE_OTHER): Payer: 59 | Admitting: Family

## 2017-01-01 ENCOUNTER — Encounter: Payer: Self-pay | Admitting: Family

## 2017-01-01 VITALS — BP 182/110 | HR 89 | Temp 98.1°F | Ht 62.0 in | Wt 165.0 lb

## 2017-01-01 DIAGNOSIS — F321 Major depressive disorder, single episode, moderate: Secondary | ICD-10-CM | POA: Diagnosis not present

## 2017-01-01 DIAGNOSIS — R030 Elevated blood-pressure reading, without diagnosis of hypertension: Secondary | ICD-10-CM | POA: Diagnosis not present

## 2017-01-01 DIAGNOSIS — R5383 Other fatigue: Secondary | ICD-10-CM | POA: Diagnosis not present

## 2017-01-01 DIAGNOSIS — E039 Hypothyroidism, unspecified: Secondary | ICD-10-CM

## 2017-01-01 DIAGNOSIS — I10 Essential (primary) hypertension: Secondary | ICD-10-CM | POA: Insufficient documentation

## 2017-01-01 DIAGNOSIS — J4540 Moderate persistent asthma, uncomplicated: Secondary | ICD-10-CM

## 2017-01-01 DIAGNOSIS — M79651 Pain in right thigh: Secondary | ICD-10-CM | POA: Diagnosis not present

## 2017-01-01 DIAGNOSIS — E119 Type 2 diabetes mellitus without complications: Secondary | ICD-10-CM | POA: Diagnosis not present

## 2017-01-01 LAB — CBC WITH DIFFERENTIAL/PLATELET
BASOS PCT: 0.4 % (ref 0.0–3.0)
Basophils Absolute: 0 10*3/uL (ref 0.0–0.1)
EOS PCT: 5 % (ref 0.0–5.0)
Eosinophils Absolute: 0.2 10*3/uL (ref 0.0–0.7)
HCT: 44.8 % (ref 36.0–46.0)
HEMOGLOBIN: 15.1 g/dL — AB (ref 12.0–15.0)
LYMPHS ABS: 2 10*3/uL (ref 0.7–4.0)
Lymphocytes Relative: 41.6 % (ref 12.0–46.0)
MCHC: 33.7 g/dL (ref 30.0–36.0)
MCV: 85.8 fl (ref 78.0–100.0)
MONOS PCT: 6.1 % (ref 3.0–12.0)
Monocytes Absolute: 0.3 10*3/uL (ref 0.1–1.0)
Neutro Abs: 2.2 10*3/uL (ref 1.4–7.7)
Neutrophils Relative %: 46.9 % (ref 43.0–77.0)
Platelets: 312 10*3/uL (ref 150.0–400.0)
RBC: 5.22 Mil/uL — AB (ref 3.87–5.11)
RDW: 13.3 % (ref 11.5–15.5)
WBC: 4.8 10*3/uL (ref 4.0–10.5)

## 2017-01-01 LAB — COMPREHENSIVE METABOLIC PANEL
ALK PHOS: 63 U/L (ref 39–117)
ALT: 24 U/L (ref 0–35)
AST: 17 U/L (ref 0–37)
Albumin: 4.7 g/dL (ref 3.5–5.2)
BILIRUBIN TOTAL: 0.5 mg/dL (ref 0.2–1.2)
BUN: 16 mg/dL (ref 6–23)
CO2: 28 meq/L (ref 19–32)
Calcium: 9.5 mg/dL (ref 8.4–10.5)
Chloride: 101 mEq/L (ref 96–112)
Creatinine, Ser: 0.74 mg/dL (ref 0.40–1.20)
GFR: 89.82 mL/min (ref >60.00)
Glucose, Bld: 149 mg/dL — ABNORMAL HIGH (ref 70–99)
Potassium: 4.3 mEq/L (ref 3.5–5.1)
SODIUM: 137 meq/L (ref 135–145)
TOTAL PROTEIN: 7 g/dL (ref 6.0–8.3)

## 2017-01-01 LAB — TSH: TSH: 2.11 u[IU]/mL (ref 0.35–4.50)

## 2017-01-01 LAB — T4, FREE: FREE T4: 0.84 ng/dL (ref 0.60–1.60)

## 2017-01-01 LAB — T3, FREE: T3 FREE: 3.4 pg/mL (ref 2.3–4.2)

## 2017-01-01 LAB — HEMOGLOBIN A1C: HEMOGLOBIN A1C: 6.6 % — AB (ref 4.6–6.5)

## 2017-01-01 LAB — VITAMIN D 25 HYDROXY (VIT D DEFICIENCY, FRACTURES): VITD: 22.19 ng/mL — ABNORMAL LOW (ref 30.00–100.00)

## 2017-01-01 MED ORDER — LISINOPRIL 5 MG PO TABS
5.0000 mg | ORAL_TABLET | Freq: Every day | ORAL | 3 refills | Status: DC
Start: 2017-01-01 — End: 2017-05-14

## 2017-01-01 MED ORDER — SERTRALINE HCL 100 MG PO TABS: 100.0000 mg | ORAL_TABLET | Freq: Every day | ORAL | 1 refills | Status: DC

## 2017-01-01 NOTE — Assessment & Plan Note (Signed)
Doing well on zoloft. Will increase to see if helps with sleep.

## 2017-01-01 NOTE — Assessment & Plan Note (Signed)
Well controlled 

## 2017-01-01 NOTE — Patient Instructions (Signed)
Increase zoloft to 100mg  to see if helps with sleep  Start 5mg  lisinopril; monitor BP, goal < 140/90. Stop decongestant  Come back on Thursday afternoon for labs  Let me know if congestion does not improve  f/u 3 months

## 2017-01-01 NOTE — Progress Notes (Signed)
Pre visit review using our clinic review tool, if applicable. No additional management support is needed unless otherwise documented below in the visit note. 

## 2017-01-01 NOTE — Assessment & Plan Note (Signed)
Elevated. No SOB, CP. Start lisionpril. Recheck bmp in 4 days.

## 2017-01-01 NOTE — Assessment & Plan Note (Signed)
New. Nonspecific bone pain. Benign exam. Pending XR and CBC.

## 2017-01-01 NOTE — Assessment & Plan Note (Signed)
Suspect may be playing a role of fatigue, temperature intolerances. Pending thyroid studies

## 2017-01-01 NOTE — Assessment & Plan Note (Signed)
Appears controlled. Pending a1c.  

## 2017-01-01 NOTE — Assessment & Plan Note (Signed)
Multifactorial; not sleeping well. Increased zoloft. Current sinus congestion. Pending lab studies. Advised patient if labs unrevealing, she needs to follow up in the next month.

## 2017-01-02 ENCOUNTER — Other Ambulatory Visit: Payer: Self-pay | Admitting: Family

## 2017-01-02 ENCOUNTER — Encounter: Payer: Self-pay | Admitting: Family

## 2017-01-02 DIAGNOSIS — M79651 Pain in right thigh: Secondary | ICD-10-CM

## 2017-01-04 ENCOUNTER — Other Ambulatory Visit (INDEPENDENT_AMBULATORY_CARE_PROVIDER_SITE_OTHER): Payer: 59

## 2017-01-04 DIAGNOSIS — R030 Elevated blood-pressure reading, without diagnosis of hypertension: Secondary | ICD-10-CM | POA: Diagnosis not present

## 2017-01-04 LAB — BASIC METABOLIC PANEL
BUN: 17 mg/dL (ref 6–23)
CALCIUM: 10 mg/dL (ref 8.4–10.5)
CO2: 26 meq/L (ref 19–32)
Chloride: 101 mEq/L (ref 96–112)
Creatinine, Ser: 1.08 mg/dL (ref 0.40–1.20)
GFR: 58.06 mL/min — AB (ref >60.00)
GLUCOSE: 115 mg/dL — AB (ref 70–99)
POTASSIUM: 4.2 meq/L (ref 3.5–5.1)
Sodium: 137 mEq/L (ref 135–145)

## 2017-01-08 ENCOUNTER — Encounter: Payer: Self-pay | Admitting: Family

## 2017-01-09 ENCOUNTER — Ambulatory Visit (INDEPENDENT_AMBULATORY_CARE_PROVIDER_SITE_OTHER): Payer: 59 | Admitting: Family

## 2017-01-09 ENCOUNTER — Encounter: Payer: Self-pay | Admitting: Family

## 2017-01-09 ENCOUNTER — Ambulatory Visit (INDEPENDENT_AMBULATORY_CARE_PROVIDER_SITE_OTHER): Payer: 59

## 2017-01-09 VITALS — BP 138/86 | HR 86 | Temp 98.2°F | Ht 62.0 in | Wt 160.0 lb

## 2017-01-09 DIAGNOSIS — R5383 Other fatigue: Secondary | ICD-10-CM

## 2017-01-09 DIAGNOSIS — M79651 Pain in right thigh: Secondary | ICD-10-CM

## 2017-01-09 DIAGNOSIS — R10823 Right lower quadrant rebound abdominal tenderness: Secondary | ICD-10-CM

## 2017-01-09 LAB — COMPREHENSIVE METABOLIC PANEL
ALT: 22 U/L (ref 0–35)
AST: 18 U/L (ref 0–37)
Albumin: 4.9 g/dL (ref 3.5–5.2)
Alkaline Phosphatase: 65 U/L (ref 39–117)
BUN: 18 mg/dL (ref 6–23)
CHLORIDE: 100 meq/L (ref 96–112)
CO2: 28 meq/L (ref 19–32)
Calcium: 10.1 mg/dL (ref 8.4–10.5)
Creatinine, Ser: 0.81 mg/dL (ref 0.40–1.20)
GFR: 80.91 mL/min (ref >60.00)
GLUCOSE: 111 mg/dL — AB (ref 70–99)
POTASSIUM: 4 meq/L (ref 3.5–5.1)
Sodium: 136 mEq/L (ref 135–145)
Total Bilirubin: 0.4 mg/dL (ref 0.2–1.2)
Total Protein: 7.5 g/dL (ref 6.0–8.3)

## 2017-01-09 LAB — C-REACTIVE PROTEIN: CRP: 0.3 mg/dL — ABNORMAL LOW (ref 0.5–20.0)

## 2017-01-09 LAB — SEDIMENTATION RATE: SED RATE: 8 mm/h (ref 0–20)

## 2017-01-09 NOTE — Progress Notes (Signed)
Pre visit review using our clinic review tool, if applicable. No additional management support is needed unless otherwise documented below in the visit note. 

## 2017-01-09 NOTE — Progress Notes (Signed)
Subjective:    Patient ID: Bridget Peters, female    DOB: 1971/08/24, 46 y.o.   MRN: [id number]  CC: Bridget Peters is a 46 y.o. female who presents today for follow up.   HPI: Returns for reevaluation of fatigue which is unchanged. Fatigue had abrupt onset. No wheezing, sob, cough.   Couple of episodes of night sweats a couple of nights. Otherwise sleeping well and can take long naps during the and still sleep well at night. Has lost pant sizes and not weight per patient. Dropped from 16 to 8.   Notes pain in right thigh has resolved. Complains of intermittent ache in left shoulder and neck. No injury, numbness, tingling.   Last period at 46 years old. No period since. Work up at that was unrevealing. Check thyroid and hormones per patient; that's she started synthroid for hypothyroidism.  Mother ceased periods in late 42's.   Walks dog for one hour a day.   Had mono in college.  Awaiting Sleep study results.        HISTORY:  Past Medical History:  Diagnosis Date  . Allergy    Seasonal  . Asthma   . Diabetes mellitus without complication ([place])   . Pneumonia   . Thyroid disease    Past Surgical History:  Procedure Laterality Date  . BREAST SURGERY  01/2013   Breast Reduction   . NASAL SINUS SURGERY    . NASAL SINUS SURGERY  02/2014  . REDUCTION MAMMAPLASTY Bilateral 2014   Family History  Problem Relation Age of Onset  . Hypertension Mother   . Hypertension Father   . Colon polyps Father   . Cancer Maternal Aunt     Breast Cancer  . Breast cancer Maternal Aunt   . Breast cancer Paternal Aunt     Breast Cancer  . Cancer Maternal Grandmother 37    Bladder Cancer  . Stroke Maternal Grandmother   . Breast cancer Maternal Grandmother 60  . Cancer Maternal Grandfather 76    Esophagus Cancer  . Colon cancer Neg Hx   . Ovarian cancer Neg Hx     Allergies: Shellfish allergy and Xolair [omalizumab] Current Outpatient Prescriptions on File Prior to  Visit  Medication Sig Dispense Refill  . albuterol (PROVENTIL HFA;VENTOLIN HFA) 108 (90 Base) MCG/ACT inhaler Inhale 2 puffs into the lungs every 4 (four) hours as needed for wheezing or shortness of breath. 1 Inhaler 6  . albuterol (PROVENTIL) (2.5 MG/3ML) 0.083% nebulizer solution Take 3 mLs (2.5 mg total) by nebulization every 6 (six) hours as needed for wheezing or shortness of breath. 75 mL 12  . amoxicillin-clavulanate (AUGMENTIN) 875-125 MG tablet Take 1 tablet by mouth 2 (two) times daily. 14 tablet 0  . baclofen (LIORESAL) 10 MG tablet Take 1 tablet (10 mg total) by mouth 3 (three) times daily. 30 each 0  . benzonatate (TESSALON) 100 MG capsule Take 1 capsule (100 mg total) by mouth 2 (two) times daily as needed for cough. 20 capsule 0  . canagliflozin (INVOKANA) 300 MG TABS tablet Take 1 tablet (300 mg total) by mouth daily. 90 tablet 3  . cetirizine (ZYRTEC) 10 MG tablet Take 1 tablet (10 mg total) by mouth daily. 30 tablet 5  . fluticasone (FLONASE) 50 MCG/ACT nasal spray Place 2 sprays into both nostrils daily. 16 g 1  . fluticasone (VERAMYST) 27.5 MCG/SPRAY nasal spray Place 2 sprays into the nose daily. 10 g 12  . fluticasone furoate-vilanterol (BREO ELLIPTA)  200-25 MCG/INH AEPB Inhale 1 puff into the lungs daily. 60 each 5  . levocetirizine (XYZAL) 5 MG tablet Take 1 tablet (5 mg total) by mouth every evening. 30 tablet 1  . levothyroxine (SYNTHROID, LEVOTHROID) 75 MCG tablet Take 1 tablet (75 mcg total) by mouth daily. 90 tablet 3  . lisinopril (PRINIVIL,ZESTRIL) 5 MG tablet Take 1 tablet (5 mg total) by mouth daily. 90 tablet 3  . Mepolizumab (NUCALA) 100 MG SOLR Inject 100 mg into the skin every 30 (thirty) days. 1 each 5  . metFORMIN (GLUCOPHAGE XR) 500 MG 24 hr tablet Start 500mg  PO qpm. 90 tablet 3  . montelukast (SINGULAIR) 10 MG tablet Take 1 tablet (10 mg total) by mouth at bedtime. 30 tablet 5  . naproxen (NAPROSYN) 500 MG tablet Take 1 tablet (500 mg total) by mouth 2  (two) times daily with a meal. 30 tablet 0  . oseltamivir (TAMIFLU) 75 MG capsule Take 1 capsule (75 mg total) by mouth 2 (two) times daily. 10 capsule 0  . pantoprazole (PROTONIX) 40 MG tablet TAKE 1 TABLET BY MOUTH DAILY. 30 tablet 5  . predniSONE (DELTASONE) 10 MG tablet Take 1 tablet (10 mg total) by mouth daily with breakfast. 30 tablet 3  . rosuvastatin (CRESTOR) 40 MG tablet Take 1 tablet (40 mg total) by mouth daily. 90 tablet 3  . sertraline (ZOLOFT) 100 MG tablet Take 1 tablet (100 mg total) by mouth at bedtime. 90 tablet 1   Current Facility-Administered Medications on File Prior to Visit  Medication Dose Route Frequency Provider Last Rate Last Dose  . omalizumab Arvid Right) injection 300 mg  300 mg Subcutaneous Q14 Days Flora Lipps, MD   300 mg at 06/15/16 1209    Social History  Substance Use Topics  . Smoking status: Never Smoker  . Smokeless tobacco: Never Used  . Alcohol use No    Review of Systems  Constitutional: Positive for fatigue. Negative for chills and fever.  Respiratory: Negative for cough.   Cardiovascular: Negative for chest pain and palpitations.  Gastrointestinal: Negative for nausea and vomiting.  Musculoskeletal: Positive for arthralgias.      Objective:    BP 138/86   Pulse 86   Temp 98.2 F (36.8 C) (Oral)   Ht 5\' 2"  (1.575 m)   Wt 160 lb (72.6 kg)   SpO2 97%   BMI 29.26 kg/m  BP Readings from Last 3 Encounters:  01/09/17 138/86  01/01/17 (!) 182/110  07/27/16 124/76   Wt Readings from Last 3 Encounters:  01/09/17 160 lb (72.6 kg)  01/01/17 165 lb (74.8 kg)  07/27/16 163 lb (73.9 kg)    Physical Exam  Constitutional: She appears well-developed and well-nourished.  Eyes: Conjunctivae are normal.  Neck: No thyroid mass and no thyromegaly present.  Cardiovascular: Normal rate, regular rhythm, normal heart sounds and normal pulses.   Pulmonary/Chest: Effort normal and breath sounds normal. She has no wheezes. She has no rhonchi. She has  no rales.  Abdominal: Soft. Normal appearance and bowel sounds are normal. She exhibits no distension, no fluid wave, no ascites and no mass. There is tenderness in the right lower quadrant. There is no rigidity, no rebound, no guarding, no CVA tenderness, no tenderness at McBurney's point and negative Murphy's sign.  Musculoskeletal:       Cervical back: She exhibits pain and spasm. She exhibits normal range of motion, no tenderness, no bony tenderness, no swelling and no edema.  Back:  Area of pain as described by patient.   Lymphadenopathy:       Head (right side): No submental, no submandibular, no tonsillar, no preauricular, no posterior auricular and no occipital adenopathy present.       Head (left side): No submental, no submandibular, no tonsillar, no preauricular, no posterior auricular and no occipital adenopathy present.    She has no cervical adenopathy.  Neurological: She is alert.  Skin: Skin is warm and dry.  Psychiatric: She has a normal mood and affect. Her speech is normal and behavior is normal. Thought content normal.  Vitals reviewed.      Assessment & Plan:   Problem List Items Addressed This Visit      Other   Fatigue - Primary    Etiology nonspecific at this time. Lab evaluating for anemia, thyroid disease, uncontrolled DM normal and I do not suspect contributory. Vitamin D is mildly low which may contribute. Further work up today for right leg pain ( XR). CXR. Still awaiting OSA results. GI Referral. Additional labs today for autoimmune etiology. Advised patient if no significant findings, we need close follow up as etiology is still unclear at that time. Patient verbalized understanding.       Relevant Orders   Ambulatory referral to Gastroenterology   Comprehensive metabolic panel (Completed)   C-reactive protein (Completed)   CYCLIC CITRUL PEPTIDE ANTIBODY, IGG/IGA   Rheumatoid factor   Sedimentation rate (Completed)   Lyme Ab/Western Blot Reflex    US Abdomen Complete   DG Chest 2 View (Completed)   Antinuclear Antib (ANA)   Abdominal right lower quadrant tenderness    This finding was not expected as patient hasn't complained of abdominal pain, N, V, F. Due to weight loss and nonspecific fatigue, we agreed on US abdomin as well as GI consult for possible colonoscopy and EGD.           I am having Ms. Fujiwara maintain her fluticasone, albuterol, albuterol, levothyroxine, canagliflozin, metFORMIN, rosuvastatin, levocetirizine, fluticasone, predniSONE, pantoprazole, cetirizine, montelukast, baclofen, naproxen, amoxicillin-clavulanate, benzonatate, Mepolizumab, fluticasone furoate-vilanterol, oseltamivir, sertraline, and lisinopril. We will continue to administer omalizumab.   No orders of the defined types were placed in this encounter.   Return precautions given.   Risks, benefits, and alternatives of the medications and treatment plan prescribed today were discussed, and patient expressed understanding.   Education regarding symptom management and diagnosis given to patient on AVS.  Continue to follow with Mable Paris, FNP for routine health maintenance.   Erika E Hurrell and I agreed with plan.   Mable Paris, FNP

## 2017-01-09 NOTE — Telephone Encounter (Signed)
Patient stated she feels tired all the time has been having night sweats, to the point she has to change night shirts due to wetness, trying to go for a walk she cannot complete without having to rest. Patient scheduled for today at 2 .

## 2017-01-09 NOTE — Assessment & Plan Note (Addendum)
Etiology nonspecific at this time. Lab evaluating for anemia, thyroid disease, uncontrolled DM normal and I do not suspect contributory. Vitamin D is mildly low which may contribute. Further work up today for right leg pain ( XR). CXR. Still awaiting OSA results. GI Referral. Additional labs today for autoimmune etiology. Advised patient if no significant findings, we need close follow up as etiology is still unclear at that time. Patient verbalized understanding.

## 2017-01-09 NOTE — Patient Instructions (Signed)
Large work up.   If unrevealing , PLEASE COME back for re-evaluation.    Let me know if any new symptoms.

## 2017-01-10 ENCOUNTER — Encounter: Payer: Self-pay | Admitting: Family

## 2017-01-10 DIAGNOSIS — R10813 Right lower quadrant abdominal tenderness: Secondary | ICD-10-CM | POA: Insufficient documentation

## 2017-01-10 LAB — RHEUMATOID FACTOR

## 2017-01-10 LAB — LYME AB/WESTERN BLOT REFLEX: B burgdorferi Ab IgG+IgM: <0.9 Index (ref <0.90)

## 2017-01-10 LAB — ANA: Anti Nuclear Antibody(ANA): NEGATIVE

## 2017-01-10 NOTE — Assessment & Plan Note (Addendum)
This finding was not expected as patient hasn't complained of abdominal pain, N, V, F. Due to weight loss and nonspecific fatigue, we agreed on US abdomin as well as GI consult for possible colonoscopy and EGD.

## 2017-01-11 ENCOUNTER — Encounter: Payer: Self-pay | Admitting: Family

## 2017-01-11 LAB — CYCLIC CITRUL PEPTIDE ANTIBODY, IGG/IGA

## 2017-01-11 LAB — ANA W/REFLEX IF POSITIVE: Anti Nuclear Antibody(ANA): NEGATIVE

## 2017-01-16 ENCOUNTER — Ambulatory Visit
Admission: RE | Admit: 2017-01-16 | Discharge: 2017-01-16 | Disposition: A | Discharge status: Home / Self Care | Payer: 59 | Source: Ambulatory Visit | Attending: Family | Admitting: Family

## 2017-01-16 DIAGNOSIS — R5383 Other fatigue: Secondary | ICD-10-CM

## 2017-01-16 DIAGNOSIS — K760 Fatty (change of) liver, not elsewhere classified: Secondary | ICD-10-CM | POA: Diagnosis not present

## 2017-01-16 DIAGNOSIS — R140 Abdominal distension (gaseous): Secondary | ICD-10-CM | POA: Diagnosis not present

## 2017-01-17 ENCOUNTER — Encounter: Payer: Self-pay | Admitting: Family

## 2017-01-17 DIAGNOSIS — K760 Fatty (change of) liver, not elsewhere classified: Secondary | ICD-10-CM | POA: Insufficient documentation

## 2017-01-23 DIAGNOSIS — R490 Dysphonia: Secondary | ICD-10-CM | POA: Diagnosis not present

## 2017-01-23 DIAGNOSIS — J383 Other diseases of vocal cords: Secondary | ICD-10-CM | POA: Diagnosis not present

## 2017-01-29 ENCOUNTER — Encounter: Payer: Self-pay | Admitting: Family

## 2017-01-30 ENCOUNTER — Encounter: Payer: Self-pay | Admitting: Internal Medicine

## 2017-01-30 ENCOUNTER — Ambulatory Visit (INDEPENDENT_AMBULATORY_CARE_PROVIDER_SITE_OTHER): Payer: 59 | Admitting: Internal Medicine

## 2017-01-30 VITALS — BP 116/84 | HR 100 | Ht 62.0 in | Wt 159.0 lb

## 2017-01-30 DIAGNOSIS — J4550 Severe persistent asthma, uncomplicated: Secondary | ICD-10-CM

## 2017-01-30 MED ORDER — UMECLIDINIUM-VILANTEROL 62.5-25 MCG/INH IN AEPB: 1.0000 | INHALATION_SPRAY | Freq: Every day | RESPIRATORY_TRACT | 0 refills | Status: DC

## 2017-01-30 NOTE — Patient Instructions (Signed)
Follow up with Nucala therapy

## 2017-01-30 NOTE — Progress Notes (Signed)
[facility] Consultation      Date: 01/30/2017,   MRN# [id number] DALILA ARCA December 04, 1970 Code Status:  Hosp day:@LENGTHOFSTAYDAYS @ Referring MD: @ATDPROV @     PCP:      AdmissionWeight: 159 lb (72.1 kg)                 CurrentWeight: 159 lb (72.1 kg) Shyane E Pfarr is a 46 y.o. old female seen in consultation for asthma     CHIEF COMPLAINT:   Follow up [place]  Patient restarted  taking BREO seems to be tolerating better Had FLU A 2 months ago   Plan was to take Xolair injections but had reaction to injection  Ige levels 320, Eos count 200  Patient had allergic reaction to Xolair on 06/13/16 had increased SOB, chest tightness and airway swelling Patient was sent to ER for observation   CURRENT OV Doing well, asthma controlled now Has not started Nucala yet paperwork filled out No albuterol use in 2 months No signs of infection at this time Has been dx with mild OSA No acute issues at this time   IgE levels     MEDICATIONS    Home Medication:   Current Medication:  Current Outpatient Prescriptions:  .  albuterol (PROVENTIL HFA;VENTOLIN HFA) 108 (90 Base) MCG/ACT inhaler, Inhale 2 puffs into the lungs every 4 (four) hours as needed for wheezing or shortness of breath., Disp: 1 Inhaler, Rfl: 6 .  albuterol (PROVENTIL) (2.5 MG/3ML) 0.083% nebulizer solution, Take 3 mLs (2.5 mg total) by nebulization every 6 (six) hours as needed for wheezing or shortness of breath., Disp: 75 mL, Rfl: 12 .  baclofen (LIORESAL) 10 MG tablet, Take 1 tablet (10 mg total) by mouth 3 (three) times daily., Disp: 30 each, Rfl: 0 .  canagliflozin (INVOKANA) 300 MG TABS tablet, Take 1 tablet (300 mg total) by mouth daily., Disp: 90 tablet, Rfl: 3 .  cetirizine (ZYRTEC) 10 MG tablet, Take 1 tablet (10 mg total) by mouth daily., Disp: 30 tablet, Rfl: 5 .  fluticasone (FLONASE) 50 MCG/ACT nasal spray, Place 2 sprays into both  nostrils daily., Disp: 16 g, Rfl: 1 .  fluticasone (VERAMYST) 27.5 MCG/SPRAY nasal spray, Place 2 sprays into the nose daily., Disp: 10 g, Rfl: 12 .  fluticasone furoate-vilanterol (BREO ELLIPTA) 200-25 MCG/INH AEPB, Inhale 1 puff into the lungs daily., Disp: 60 each, Rfl: 5 .  levocetirizine (XYZAL) 5 MG tablet, Take 1 tablet (5 mg total) by mouth every evening., Disp: 30 tablet, Rfl: 1 .  levothyroxine (SYNTHROID, LEVOTHROID) 75 MCG tablet, Take 1 tablet (75 mcg total) by mouth daily., Disp: 90 tablet, Rfl: 3 .  lisinopril (PRINIVIL,ZESTRIL) 5 MG tablet, Take 1 tablet (5 mg total) by mouth daily., Disp: 90 tablet, Rfl: 3 .  Mepolizumab (NUCALA) 100 MG SOLR, Inject 100 mg into the skin every 30 (thirty) days., Disp: 1 each, Rfl: 5 .  metFORMIN (GLUCOPHAGE XR) 500 MG 24 hr tablet, Start 500mg  PO qpm., Disp: 90 tablet, Rfl: 3 .  montelukast (SINGULAIR) 10 MG tablet, Take 1 tablet (10 mg total) by mouth at bedtime., Disp: 30 tablet, Rfl: 5 .  naproxen (NAPROSYN) 500 MG tablet, Take 1 tablet (500 mg total) by mouth 2 (two) times daily with a meal., Disp: 30 tablet, Rfl: 0 .  pantoprazole (PROTONIX) 40 MG tablet, TAKE 1 TABLET BY MOUTH DAILY., Disp: 30 tablet, Rfl: 5 .  rosuvastatin (CRESTOR) 40 MG tablet, Take 1  tablet (40 mg total) by mouth daily., Disp: 90 tablet, Rfl: 3 .  sertraline (ZOLOFT) 100 MG tablet, Take 1 tablet (100 mg total) by mouth at bedtime., Disp: 90 tablet, Rfl: 1 .  fexofenadine (ALLEGRA) 180 MG tablet, Take 180 mg by mouth daily., Disp: , Rfl: 11  Current Facility-Administered Medications:  .  omalizumab Arvid Right) injection 300 mg, 300 mg, Subcutaneous, Q14 Days, Flora Lipps, MD, 300 mg at 06/15/16 1209    ALLERGIES   Shellfish allergy and Xolair [omalizumab]     REVIEW OF SYSTEMS   Review of Systems  Constitutional: Negative for chills, fever, malaise/fatigue and weight loss.  HENT: Negative for congestion.   Respiratory: Negative for cough, hemoptysis, sputum  production, shortness of breath and wheezing.   Cardiovascular: Negative for chest pain.  Gastrointestinal: Negative for heartburn, nausea and vomiting.  Endo/Heme/Allergies: Negative.   All other systems reviewed and are negative.    VS: BP 116/84 (BP Location: Left Arm, Patient Position: Sitting, Cuff Size: Normal)   Pulse 100   Ht 5\' 2"  (1.575 m)   Wt 159 lb (72.1 kg)   SpO2 100%   BMI 29.08 kg/m      PHYSICAL EXAM  Physical Exam  Constitutional: She is oriented to person, place, and time. She appears well-developed and well-nourished. No distress.  HENT:  Mouth/Throat: No oropharyngeal exudate.  Neck: Neck supple.  Cardiovascular: Normal rate, regular rhythm and normal heart sounds.   No murmur heard. Pulmonary/Chest: Effort normal and breath sounds normal. No stridor. No respiratory distress. She has no wheezes.  Musculoskeletal: Normal range of motion.  Neurological: She is alert and oriented to person, place, and time. No cranial nerve deficit.  Skin: Skin is warm. She is not diaphoretic.  Psychiatric: She has a normal mood and affect.     IgE levels=320 Eosinophil count=200  IgE     ASSESSMENT/PLAN   46 yo white female seen today for severe chronic persistent asthma without exacerbation at this time.  With elevated IgE levels and elevated EOS count, with several ASTHMA exacerbations in the past 6 months. Patient meets criteria for Immnuotherapy However, patient had sever reaction to Xolair injections, and despite this reaction, patient has decided to try alternative Immunotherapy  Will re-assess nucala therapy  1.avoid all triggers 2.BREO to 200 3.albuterol as needed 4.continue PPI 5.continue veramyst 6.continue singulair/zyrtec 7.Referal to Immunotherapy with Nucala pending   Follow up in 3 months   Patient satisfied with Plan of action and management. All questions answered  [name], M.D.  [facility]    Medical Director [place] Director [facility] Cardio-Pulmonary Department

## 2017-01-31 DIAGNOSIS — G4733 Obstructive sleep apnea (adult) (pediatric): Secondary | ICD-10-CM | POA: Diagnosis not present

## 2017-02-01 ENCOUNTER — Encounter: Payer: Self-pay | Admitting: Internal Medicine

## 2017-02-13 DIAGNOSIS — R5383 Other fatigue: Secondary | ICD-10-CM | POA: Diagnosis not present

## 2017-02-13 DIAGNOSIS — R634 Abnormal weight loss: Secondary | ICD-10-CM | POA: Diagnosis not present

## 2017-02-13 DIAGNOSIS — R161 Splenomegaly, not elsewhere classified: Secondary | ICD-10-CM | POA: Diagnosis not present

## 2017-02-13 DIAGNOSIS — K760 Fatty (change of) liver, not elsewhere classified: Secondary | ICD-10-CM | POA: Diagnosis not present

## 2017-02-13 DIAGNOSIS — D582 Other hemoglobinopathies: Secondary | ICD-10-CM | POA: Diagnosis not present

## 2017-02-13 DIAGNOSIS — Z800 Family history of malignant neoplasm of digestive organs: Secondary | ICD-10-CM | POA: Diagnosis not present

## 2017-02-13 DIAGNOSIS — R194 Change in bowel habit: Secondary | ICD-10-CM | POA: Diagnosis not present

## 2017-02-13 DIAGNOSIS — Z8719 Personal history of other diseases of the digestive system: Secondary | ICD-10-CM | POA: Diagnosis not present

## 2017-02-16 ENCOUNTER — Encounter: Payer: Self-pay | Admitting: Internal Medicine

## 2017-02-19 ENCOUNTER — Telehealth: Payer: Self-pay | Admitting: Internal Medicine

## 2017-02-19 NOTE — Telephone Encounter (Signed)
Created in error

## 2017-02-22 DIAGNOSIS — G4733 Obstructive sleep apnea (adult) (pediatric): Secondary | ICD-10-CM | POA: Diagnosis not present

## 2017-02-23 DIAGNOSIS — R5383 Other fatigue: Secondary | ICD-10-CM | POA: Diagnosis not present

## 2017-02-23 DIAGNOSIS — R634 Abnormal weight loss: Secondary | ICD-10-CM | POA: Diagnosis not present

## 2017-03-02 ENCOUNTER — Inpatient Hospital Stay: Payer: 59 | Admitting: Hematology and Oncology

## 2017-03-09 ENCOUNTER — Encounter: Payer: Self-pay | Admitting: Hematology and Oncology

## 2017-03-09 ENCOUNTER — Inpatient Hospital Stay: Payer: 59 | Attending: Hematology and Oncology | Admitting: Hematology and Oncology

## 2017-03-09 ENCOUNTER — Inpatient Hospital Stay: Payer: 59

## 2017-03-09 VITALS — BP 123/80 | HR 101 | Temp 97.5°F | Resp 18 | Ht 62.0 in | Wt 157.3 lb

## 2017-03-09 DIAGNOSIS — E119 Type 2 diabetes mellitus without complications: Secondary | ICD-10-CM | POA: Diagnosis not present

## 2017-03-09 DIAGNOSIS — J45909 Unspecified asthma, uncomplicated: Secondary | ICD-10-CM | POA: Insufficient documentation

## 2017-03-09 DIAGNOSIS — E079 Disorder of thyroid, unspecified: Secondary | ICD-10-CM | POA: Diagnosis not present

## 2017-03-09 DIAGNOSIS — R61 Generalized hyperhidrosis: Secondary | ICD-10-CM | POA: Diagnosis not present

## 2017-03-09 DIAGNOSIS — R634 Abnormal weight loss: Secondary | ICD-10-CM

## 2017-03-09 DIAGNOSIS — R1010 Upper abdominal pain, unspecified: Secondary | ICD-10-CM

## 2017-03-09 DIAGNOSIS — Z79899 Other long term (current) drug therapy: Secondary | ICD-10-CM | POA: Insufficient documentation

## 2017-03-09 DIAGNOSIS — F329 Major depressive disorder, single episode, unspecified: Secondary | ICD-10-CM | POA: Diagnosis not present

## 2017-03-09 DIAGNOSIS — D582 Other hemoglobinopathies: Secondary | ICD-10-CM

## 2017-03-09 DIAGNOSIS — G4730 Sleep apnea, unspecified: Secondary | ICD-10-CM | POA: Diagnosis not present

## 2017-03-09 DIAGNOSIS — Z7984 Long term (current) use of oral hypoglycemic drugs: Secondary | ICD-10-CM | POA: Insufficient documentation

## 2017-03-09 DIAGNOSIS — R5383 Other fatigue: Secondary | ICD-10-CM | POA: Insufficient documentation

## 2017-03-09 LAB — CBC WITH DIFFERENTIAL/PLATELET
Basophils Absolute: 0 10*3/uL (ref 0–0.1)
Basophils Relative: 1 %
Eosinophils Absolute: 0.2 10*3/uL (ref 0–0.7)
Eosinophils Relative: 4 %
HCT: 42.8 % (ref 35.0–47.0)
Hemoglobin: 15.1 g/dL (ref 12.0–16.0)
Lymphocytes Relative: 36 %
Lymphs Abs: 1.8 10*3/uL (ref 1.0–3.6)
MCH: 29.4 pg (ref 26.0–34.0)
MCHC: 35.2 g/dL (ref 32.0–36.0)
MCV: 83.6 fL (ref 80.0–100.0)
Monocytes Absolute: 0.3 10*3/uL (ref 0.2–0.9)
Monocytes Relative: 7 %
Neutro Abs: 2.7 10*3/uL (ref 1.4–6.5)
Neutrophils Relative %: 52 %
Platelets: 292 10*3/uL (ref 150–440)
RBC: 5.12 MIL/uL (ref 3.80–5.20)
RDW: 13.3 % (ref 11.5–14.5)
WBC: 5.1 10*3/uL (ref 3.6–11.0)

## 2017-03-09 LAB — COMPREHENSIVE METABOLIC PANEL
ALT: 26 U/L (ref 14–54)
AST: 22 U/L (ref 15–41)
Albumin: 5 g/dL (ref 3.5–5.0)
Alkaline Phosphatase: 62 U/L (ref 38–126)
Anion gap: 6 (ref 5–15)
BUN: 21 mg/dL — ABNORMAL HIGH (ref 6–20)
CO2: 27 mmol/L (ref 22–32)
Calcium: 9.9 mg/dL (ref 8.9–10.3)
Chloride: 102 mmol/L (ref 101–111)
Creatinine, Ser: 0.88 mg/dL (ref 0.44–1.00)
GFR calc Af Amer: >60 mL/min (ref >60)
GFR calc non Af Amer: >60 mL/min (ref >60)
Glucose, Bld: 127 mg/dL — ABNORMAL HIGH (ref 65–99)
Potassium: 4.2 mmol/L (ref 3.5–5.1)
Sodium: 135 mmol/L (ref 135–145)
Total Bilirubin: 0.7 mg/dL (ref 0.3–1.2)
Total Protein: 8 g/dL (ref 6.5–8.1)

## 2017-03-09 LAB — LACTATE DEHYDROGENASE: LDH: 128 U/L (ref 98–192)

## 2017-03-09 LAB — URIC ACID: Uric Acid, Serum: 5 mg/dL (ref 2.3–6.6)

## 2017-03-09 NOTE — Progress Notes (Signed)
[facility] day:  03/09/2017  Chief Complaint: Bridget Peters is a 46 y.o. female with an elevated hemoglobin who is referred in consultation by Tammi Klippel, PA for assessment and management.  HPI: The patient states that she was recently told that she has trouble with her blood counts, liver and spleen.  For the past 3 months, she has been tired all the time. She sleeps all the time. She denies depression. She has upper abdominal discomfort all the time which is not aggravated or improved by anything.  She states that her liver and spleen are enlarged.  Appetite is poor. She describes drenching night sweats for the past 2-3 months. She had menopause at 46 years of age. She denies any hot flashes. She has lost 15 pounds in the past month. She denies any nausea, vomiting or diarrhea.  She has a 2 week history of after bath itching.  She was seen by gastroenterology at the [facility].  Abdominal ultrasound on 01/16/2017 revealed a fatty slightly elongated liver and top normal size spleen (12.2 cm; volume 411 cc).  Guaiac cards were 1 of 2 positive on 02/23/2017.  She is scheduled for upper endoscopy and colonoscopy next week.    CBC on 08/22/2016 revealed a hematocrit of 44.7, hemoglobin 15, MCV 87, platelets 241,000, white count 5400.  CBC on 01/01/2017 included a hematocrit of 44.8, hemoglobin 15.1, MCV 85.8, platelets 312,000, white count 4800 with an ANC of 2200. Differential was unremarkable.  Iron studies on 02/13/2017 revealed a ferritin of 111, 14% iron saturation, and a TIBC of 442.3  Mammogram on 05/04/2016 revealed no evidence of malignancy.  She states that she was diagnosed with sleep apnea and has been using a CPAP machine for 3 weeks.  She has a follow-up to assess fit/efficacy on 05/02/2017.  She notes no difference in her fatigue since initiation of CPAP.  She has a family history of esophageal cancer, breast cancer, and bladder  cancer.     Past Medical History:  Diagnosis Date  . Allergy    Seasonal  . Asthma   . Depression   . Diabetes mellitus without complication ([place])   . Pneumonia   . Shortness of breath   . Sleep apnea   . Thyroid disease     Past Surgical History:  Procedure Laterality Date  . BREAST SURGERY  01/2013   Breast Reduction   . NASAL SINUS SURGERY    . NASAL SINUS SURGERY  02/2014  . REDUCTION MAMMAPLASTY Bilateral 2014    Family History  Problem Relation Age of Onset  . Hypertension Mother   . Hypertension Father   . Colon polyps Father   . Cancer Maternal Aunt        Breast Cancer  . Breast cancer Maternal Aunt   . Breast cancer Paternal Aunt        Breast Cancer  . Cancer Maternal Grandmother 22       Bladder Cancer  . Stroke Maternal Grandmother   . Breast cancer Maternal Grandmother 60  . Cancer Maternal Grandfather 60       Esophagus Cancer  . Colon cancer Neg Hx   . Ovarian cancer Neg Hx     Social History:  reports that she has never smoked. She has never used smokeless tobacco. She reports that she drinks alcohol. She reports that she does not use drugs.  She denies any exposure to radiation or toxins.  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  She lives in [place].  The patient is alone today.  Allergies:  Allergies  Allergen Reactions  . Milk-Related Compounds Hives  . Shellfish Allergy Hives    Shortness of breath  . Wheat Bran Hives  . Xolair [Omalizumab]     Itching    Current Medications: Current Outpatient Prescriptions  Medication Sig Dispense Refill  . albuterol (PROVENTIL HFA;VENTOLIN HFA) 108 (90 Base) MCG/ACT inhaler Inhale 2 puffs into the lungs every 4 (four) hours as needed for wheezing or shortness of breath. 1 Inhaler 6  . albuterol (PROVENTIL) (2.5 MG/3ML) 0.083% nebulizer solution Take 3 mLs (2.5 mg total) by nebulization every 6 (six) hours as needed for wheezing or shortness of breath. 75 mL 12  .  canagliflozin (INVOKANA) 300 MG TABS tablet Take 1 tablet (300 mg total) by mouth daily. 90 tablet 3  . fexofenadine (ALLEGRA) 180 MG tablet Take 180 mg by mouth daily.  11  . fluticasone (FLONASE) 50 MCG/ACT nasal spray Place 2 sprays into both nostrils daily. 16 g 1  . fluticasone (VERAMYST) 27.5 MCG/SPRAY nasal spray Place 2 sprays into the nose daily. 10 g 12  . fluticasone furoate-vilanterol (BREO ELLIPTA) 200-25 MCG/INH AEPB Inhale 1 puff into the lungs daily. 60 each 5  . levothyroxine (SYNTHROID, LEVOTHROID) 75 MCG tablet Take 1 tablet (75 mcg total) by mouth daily. 90 tablet 3  . lisinopril (PRINIVIL,ZESTRIL) 5 MG tablet Take 1 tablet (5 mg total) by mouth daily. 90 tablet 3  . metFORMIN (GLUCOPHAGE XR) 500 MG 24 hr tablet Start 575m PO qpm. 90 tablet 3  . montelukast (SINGULAIR) 10 MG tablet Take 1 tablet (10 mg total) by mouth at bedtime. 30 tablet 5  . pantoprazole (PROTONIX) 40 MG tablet TAKE 1 TABLET BY MOUTH DAILY. 30 tablet 5  . sertraline (ZOLOFT) 100 MG tablet Take 1 tablet (100 mg total) by mouth at bedtime. 90 tablet 1  . baclofen (LIORESAL) 10 MG tablet Take 1 tablet (10 mg total) by mouth 3 (three) times daily. (Patient not taking: Reported on 03/09/2017) 30 each 0  . cetirizine (ZYRTEC) 10 MG tablet Take 1 tablet (10 mg total) by mouth daily. (Patient not taking: Reported on 03/09/2017) 30 tablet 5  . levocetirizine (XYZAL) 5 MG tablet Take 1 tablet (5 mg total) by mouth every evening. (Patient not taking: Reported on 03/09/2017) 30 tablet 1  . Mepolizumab (NUCALA) 100 MG SOLR Inject 100 mg into the skin every 30 (thirty) days. (Patient not taking: Reported on 03/09/2017) 1 each 5  . naproxen (NAPROSYN) 500 MG tablet Take 1 tablet (500 mg total) by mouth 2 (two) times daily with a meal. (Patient not taking: Reported on 03/09/2017) 30 tablet 0  . rosuvastatin (CRESTOR) 40 MG tablet Take 1 tablet (40 mg total) by mouth daily. (Patient not taking: Reported on 03/09/2017) 90 tablet 3    Current Facility-Administered Medications  Medication Dose Route Frequency Provider Last Rate Last Dose  . omalizumab (Arvid Right injection 300 mg  300 mg Subcutaneous Q14 Days KFlora Lipps MD   300 mg at 06/15/16 1209    Review of Systems:  GENERAL:  Fatigue.  No fevers.  Drenching night sweats x 2-3 months.  Weight loss of 15 pounds in the past month. PERFORMANCE STATUS (ECOG):  1 HEENT:  Runny nose secondary to allergies.  No visual changes, sore throat, mouth sores or tenderness. Lungs: No shortness of breath or cough.  No hemoptysis. Cardiac:  No chest pain, palpitations, orthopnea, or PND. GI:  Upper abdominal pain.  Constipation.  No nausea, vomiting, diarrhea, melena or hematochezia. Guaiac + stool (1 of 2). GU:  No urgency, frequency, dysuria, or hematuria. Musculoskeletal:  No back pain.  No joint pain.  No muscle tenderness. Extremities:  No pain or swelling. Skin:  No rashes or skin changes.  After bath itching x 2 weeks. Neuro:  No headache, numbness or weakness, balance or coordination issues. Endocrine:  No diabetes.  Thyroid disease on Synthroid.  No hot flashes.  Menopause at age 40. Psych:  No mood changes, depression or anxiety. Pain:  No focal pain. Review of systems:  All other systems reviewed and found to be negative.  Physical Exam: Blood pressure 123/80, pulse (!) 101, temperature 97.5 F (36.4 C), temperature source Tympanic, resp. rate 18, height 5' 2" (1.575 m), weight 157 lb 5 oz (71.4 kg). GENERAL:  Well developed, well nourished, woman sitting comfortably in the exam room in no acute distress. MENTAL STATUS:  Alert and oriented to person, place and time. HEAD:  Short frosted hair.  Normocephalic, atraumatic, face symmetric, no Cushingoid features. EYES:  Light brown eyes.  Pupils equal round and reactive to light and accomodation.  No conjunctivitis or scleral icterus. ENT:  Oropharynx clear without lesion.  Tongue normal. Mucous membranes moist.   RESPIRATORY:  Clear to auscultation without rales, wheezes or rhonchi. CARDIOVASCULAR:  Regular rate and rhythm without murmur, rub or gallop. ABDOMEN:  Soft, non-tender, with active bowel sounds, and no hepatosplenomegaly.  No masses. SKIN:  No rashes, ulcers or lesions. EXTREMITIES: No edema, no skin discoloration or tenderness.  No palpable cords. LYMPH NODES: No palpable cervical, supraclavicular, axillary or inguinal adenopathy  NEUROLOGICAL: Unremarkable. PSYCH:  Appropriate.   Appointment on 03/09/2017  Component Date Value Ref Range Status  . WBC 03/09/2017 5.1  3.6 - 11.0 K/uL Final  . RBC 03/09/2017 5.12  3.80 - 5.20 MIL/uL Final  . Hemoglobin 03/09/2017 15.1  12.0 - 16.0 g/dL Final  . HCT 03/09/2017 42.8  35.0 - 47.0 % Final  . MCV 03/09/2017 83.6  80.0 - 100.0 fL Final  . MCH 03/09/2017 29.4  26.0 - 34.0 pg Final  . MCHC 03/09/2017 35.2  32.0 - 36.0 g/dL Final  . RDW 03/09/2017 13.3  11.5 - 14.5 % Final  . Platelets 03/09/2017 292  150 - 440 K/uL Final  . Neutrophils Relative % 03/09/2017 52  % Final  . Neutro Abs 03/09/2017 2.7  1.4 - 6.5 K/uL Final  . Lymphocytes Relative 03/09/2017 36  % Final  . Lymphs Abs 03/09/2017 1.8  1.0 - 3.6 K/uL Final  . Monocytes Relative 03/09/2017 7  % Final  . Monocytes Absolute 03/09/2017 0.3  0.2 - 0.9 K/uL Final  . Eosinophils Relative 03/09/2017 4  % Final  . Eosinophils Absolute 03/09/2017 0.2  0 - 0.7 K/uL Final  . Basophils Relative 03/09/2017 1  % Final  . Basophils Absolute 03/09/2017 0.0  0 - 0.1 K/uL Final  . Sodium 03/09/2017 135  135 - 145 mmol/L Final  . Potassium 03/09/2017 4.2  3.5 - 5.1 mmol/L Final  . Chloride 03/09/2017 102  101 - 111 mmol/L Final  . CO2 03/09/2017 27  22 - 32 mmol/L Final  . Glucose, Bld 03/09/2017 127* 65 - 99 mg/dL Final  . BUN 03/09/2017 21* 6 - 20 mg/dL Final  . Creatinine, Ser 03/09/2017 0.88  0.44 - 1.00 mg/dL Final  . Calcium 03/09/2017  9.9  8.9 - 10.3 mg/dL Final  . Total Protein  03/09/2017 8.0  6.5 - 8.1 g/dL Final  . Albumin 03/09/2017 5.0  3.5 - 5.0 g/dL Final  . AST 03/09/2017 22  15 - 41 U/L Final  . ALT 03/09/2017 26  14 - 54 U/L Final  . Alkaline Phosphatase 03/09/2017 62  38 - 126 U/L Final  . Total Bilirubin 03/09/2017 0.7  0.3 - 1.2 mg/dL Final  . GFR calc non Af Amer 03/09/2017 >60  >60 mL/min Final  . GFR calc Af Amer 03/09/2017 >60  >60 mL/min Final   Comment: (NOTE) The eGFR has been calculated using the CKD EPI equation. This calculation has not been validated in all clinical situations. eGFR's persistently <60 mL/min signify possible Chronic Kidney Disease.   . Anion gap 03/09/2017 6  5 - 15 Final  . LDH 03/09/2017 128  98 - 192 U/L Final  . Uric Acid, Serum 03/09/2017 5.0  2.3 - 6.6 mg/dL Final    Assessment:  Bridget Peters is a 46 y.o. female with 2-3 month history of fatigue, weight loss, drenching night sweats, and after bath itching worrisome for lymphoma.  She has upper abdominal pain.  One of 2 guaiac cards were positive.  She is scheduled for EGD and colonoscopy next week.  Abdominal ultrasound on 01/16/2017 revealed a fatty slightly elongated liver and top normal size spleen (12.2 cm; volume 411 cc).  CBC on 01/01/2017 included a hematocrit of 44.8, hemoglobin 15.1, MCV 85.8, platelets 312,000, white count 4800 with an ANC of 2200. Differential was unremarkable.  Iron studies on 02/13/2017 were normal.  She was recently diagnosed with sleep apnea.  CPAP has not helped her fatigue.   Plan: 1.  Discuss blood counts from 01/01/2017.  Hemoglobin is upper limits of normal.  Discuss rechecking counts today and performing a work-up of erythrocytosis if hemoglobin > 16. 2.  Discuss constellation of symptoms worrisome for lymphoma.  Discuss labs and abdominal imaging.  3.  Labs today:  CBC with diff, CMP, LDH, uric acid. 4.  Schedule abdominal and pelvic CT scan. 5.  RTC after above for MD assessment and discussion regarding direction of  therapy.   Lequita Asal, MD  03/09/2017, 10:05 AM

## 2017-03-09 NOTE — Progress Notes (Signed)
Patient here today as new evaluation regarding elevated Hgb.  Referred by Gaston Islam, NP for Dr. Keith Rake.

## 2017-03-13 DIAGNOSIS — K210 Gastro-esophageal reflux disease with esophagitis: Secondary | ICD-10-CM | POA: Diagnosis not present

## 2017-03-13 DIAGNOSIS — R634 Abnormal weight loss: Secondary | ICD-10-CM | POA: Diagnosis not present

## 2017-03-13 DIAGNOSIS — K2970 Gastritis, unspecified, without bleeding: Secondary | ICD-10-CM | POA: Diagnosis not present

## 2017-03-13 DIAGNOSIS — K5730 Diverticulosis of large intestine without perforation or abscess without bleeding: Secondary | ICD-10-CM | POA: Diagnosis not present

## 2017-03-13 DIAGNOSIS — R195 Other fecal abnormalities: Secondary | ICD-10-CM | POA: Diagnosis not present

## 2017-03-13 DIAGNOSIS — K620 Anal polyp: Secondary | ICD-10-CM | POA: Diagnosis not present

## 2017-03-13 DIAGNOSIS — K2960 Other gastritis without bleeding: Secondary | ICD-10-CM | POA: Diagnosis not present

## 2017-03-13 DIAGNOSIS — K640 First degree hemorrhoids: Secondary | ICD-10-CM | POA: Diagnosis not present

## 2017-03-13 DIAGNOSIS — K648 Other hemorrhoids: Secondary | ICD-10-CM | POA: Diagnosis not present

## 2017-03-13 DIAGNOSIS — Z8371 Family history of colonic polyps: Secondary | ICD-10-CM | POA: Diagnosis not present

## 2017-03-13 DIAGNOSIS — K319 Disease of stomach and duodenum, unspecified: Secondary | ICD-10-CM | POA: Diagnosis not present

## 2017-03-13 DIAGNOSIS — K635 Polyp of colon: Secondary | ICD-10-CM | POA: Diagnosis not present

## 2017-03-13 DIAGNOSIS — K3189 Other diseases of stomach and duodenum: Secondary | ICD-10-CM | POA: Diagnosis not present

## 2017-03-13 DIAGNOSIS — K2950 Unspecified chronic gastritis without bleeding: Secondary | ICD-10-CM | POA: Diagnosis not present

## 2017-03-14 ENCOUNTER — Ambulatory Visit
Admission: RE | Admit: 2017-03-14 | Discharge: 2017-03-14 | Disposition: A | Discharge status: Home / Self Care | Payer: 59 | Source: Ambulatory Visit | Attending: Hematology and Oncology | Admitting: Hematology and Oncology

## 2017-03-14 DIAGNOSIS — R1010 Upper abdominal pain, unspecified: Secondary | ICD-10-CM | POA: Insufficient documentation

## 2017-03-14 DIAGNOSIS — R61 Generalized hyperhidrosis: Secondary | ICD-10-CM | POA: Diagnosis not present

## 2017-03-14 DIAGNOSIS — R109 Unspecified abdominal pain: Secondary | ICD-10-CM | POA: Diagnosis not present

## 2017-03-14 DIAGNOSIS — R634 Abnormal weight loss: Secondary | ICD-10-CM | POA: Insufficient documentation

## 2017-03-14 HISTORY — DX: Essential (primary) hypertension: I10

## 2017-03-14 MED ORDER — IOPAMIDOL (ISOVUE-300) INJECTION 61%
100.0000 mL | Freq: Once | INTRAVENOUS | Status: AC | PRN
  Administered 2017-03-14: 100 mL via INTRAVENOUS

## 2017-03-19 ENCOUNTER — Inpatient Hospital Stay (HOSPITAL_BASED_OUTPATIENT_CLINIC_OR_DEPARTMENT_OTHER): Payer: 59 | Admitting: Hematology and Oncology

## 2017-03-19 ENCOUNTER — Encounter: Payer: Self-pay | Admitting: Hematology and Oncology

## 2017-03-19 VITALS — BP 145/92 | HR 87 | Temp 96.8°F | Resp 18 | Wt 161.0 lb

## 2017-03-19 DIAGNOSIS — E119 Type 2 diabetes mellitus without complications: Secondary | ICD-10-CM | POA: Diagnosis not present

## 2017-03-19 DIAGNOSIS — R634 Abnormal weight loss: Secondary | ICD-10-CM

## 2017-03-19 DIAGNOSIS — R5383 Other fatigue: Secondary | ICD-10-CM

## 2017-03-19 DIAGNOSIS — G4730 Sleep apnea, unspecified: Secondary | ICD-10-CM | POA: Diagnosis not present

## 2017-03-19 DIAGNOSIS — E079 Disorder of thyroid, unspecified: Secondary | ICD-10-CM | POA: Diagnosis not present

## 2017-03-19 DIAGNOSIS — J45909 Unspecified asthma, uncomplicated: Secondary | ICD-10-CM | POA: Diagnosis not present

## 2017-03-19 DIAGNOSIS — F329 Major depressive disorder, single episode, unspecified: Secondary | ICD-10-CM | POA: Diagnosis not present

## 2017-03-19 DIAGNOSIS — R61 Generalized hyperhidrosis: Secondary | ICD-10-CM

## 2017-03-19 DIAGNOSIS — R1010 Upper abdominal pain, unspecified: Secondary | ICD-10-CM

## 2017-03-19 DIAGNOSIS — D582 Other hemoglobinopathies: Secondary | ICD-10-CM

## 2017-03-19 NOTE — Progress Notes (Signed)
Patient had her colonoscopy on Tuesday and CT on Wednesday.  States she has no appetite.  Pain in abdomen 5/10.

## 2017-03-19 NOTE — Progress Notes (Signed)
[facility] day:  03/19/2017  Chief Complaint: Bridget Peters is a 46 y.o. female with an elevated hemoglobin who is seen for review of work-up and discussion regarding direction of therapy.  HPI: The patient was last seen in the hematology clinic on 03/09/2017 for initial consultation.   She described a 2-3 month history of fatigue, weight loss, drenching night sweats, and after bath itching worrisome for lymphoma.  She had upper abdominal pain.  One of 2 guaiac cards were positive.  She was scheduled for EGD and colonoscopy.  Work-up on 03/09/2017 revealed a hematocrit of 42.8, hemoglobin 15.1, MCV 83.6, platelets 292,000, white count 5100 with an [place] of 2700.  CMP was normal.  LDH was 128.  She underwent EGD and colonoscopy on 03/13/2017 by Dr. Donna Christen at [facility].  No results are available.  Abdominal and pelvic CT scan on 03/14/2017 revealed no acute findings.  Spleen was normal.  Chest x-ray on 01/09/2017 was normal.  Symptomatically, she has been exhausted all week.  She continues to have abdominal discomfort.   Past Medical History:  Diagnosis Date  . Allergy    Seasonal  . Asthma   . Depression   . Diabetes mellitus without complication ([place])    Pt takes Metformin.  Marland Kitchen Hypertension   . Pneumonia   . Shortness of breath   . Sleep apnea   . Thyroid disease     Past Surgical History:  Procedure Laterality Date  . BREAST SURGERY  01/2013   Breast Reduction   . NASAL SINUS SURGERY    . NASAL SINUS SURGERY  02/2014  . REDUCTION MAMMAPLASTY Bilateral 2014    Family History  Problem Relation Age of Onset  . Hypertension Mother   . Hypertension Father   . Colon polyps Father   . Cancer Maternal Aunt        Breast Cancer  . Breast cancer Maternal Aunt   . Breast cancer Paternal Aunt        Breast Cancer  . Cancer Maternal Grandmother 70       Bladder Cancer  . Stroke Maternal Grandmother   . Breast cancer Maternal  Grandmother 60  . Cancer Maternal Grandfather 43       Esophagus Cancer  . Colon cancer Neg Hx   . Ovarian cancer Neg Hx     Social History:  reports that she has never smoked. She has never used smokeless tobacco. She reports that she drinks alcohol. She reports that she does not use drugs.  She denies any exposure to radiation or toxins.  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  She lives in [place].  The patient is alone today.  Allergies:  Allergies  Allergen Reactions  . Milk-Related Compounds Hives  . Shellfish Allergy Hives    Shortness of breath  . Wheat Bran Hives  . Xolair [Omalizumab]     Itching  . Isovue [Iopamidol] Hives    Pt developed hives approximately 6 hours after receiving Isovue 300 for CT Scan. She was given 33m oral Benadryl and symptoms improved.     Current Medications: Current Outpatient Prescriptions  Medication Sig Dispense Refill  . albuterol (PROVENTIL HFA;VENTOLIN HFA) 108 (90 Base) MCG/ACT inhaler Inhale 2 puffs into the lungs every 4 (four) hours as needed for wheezing or shortness of breath. 1 Inhaler 6  . albuterol (PROVENTIL) (2.5 MG/3ML) 0.083% nebulizer solution Take 3 mLs (2.5 mg total) by nebulization  every 6 (six) hours as needed for wheezing or shortness of breath. 75 mL 12  . canagliflozin (INVOKANA) 300 MG TABS tablet Take 1 tablet (300 mg total) by mouth daily. 90 tablet 3  . fexofenadine (ALLEGRA) 180 MG tablet Take 180 mg by mouth daily.  11  . fluticasone (FLONASE) 50 MCG/ACT nasal spray Place 2 sprays into both nostrils daily. 16 g 1  . fluticasone (VERAMYST) 27.5 MCG/SPRAY nasal spray Place 2 sprays into the nose daily. 10 g 12  . fluticasone furoate-vilanterol (BREO ELLIPTA) 200-25 MCG/INH AEPB Inhale 1 puff into the lungs daily. 60 each 5  . levothyroxine (SYNTHROID, LEVOTHROID) 75 MCG tablet Take 1 tablet (75 mcg total) by mouth daily. 90 tablet 3  . lisinopril (PRINIVIL,ZESTRIL) 5 MG tablet Take 1  tablet (5 mg total) by mouth daily. 90 tablet 3  . metFORMIN (GLUCOPHAGE XR) 500 MG 24 hr tablet Start 583m PO qpm. 90 tablet 3  . montelukast (SINGULAIR) 10 MG tablet Take 1 tablet (10 mg total) by mouth at bedtime. 30 tablet 5  . pantoprazole (PROTONIX) 40 MG tablet TAKE 1 TABLET BY MOUTH DAILY. 30 tablet 5  . sertraline (ZOLOFT) 100 MG tablet Take 1 tablet (100 mg total) by mouth at bedtime. 90 tablet 1  . baclofen (LIORESAL) 10 MG tablet Take 1 tablet (10 mg total) by mouth 3 (three) times daily. (Patient not taking: Reported on 03/09/2017) 30 each 0  . cetirizine (ZYRTEC) 10 MG tablet Take 1 tablet (10 mg total) by mouth daily. (Patient not taking: Reported on 03/09/2017) 30 tablet 5  . levocetirizine (XYZAL) 5 MG tablet Take 1 tablet (5 mg total) by mouth every evening. (Patient not taking: Reported on 03/09/2017) 30 tablet 1  . Mepolizumab (NUCALA) 100 MG SOLR Inject 100 mg into the skin every 30 (thirty) days. (Patient not taking: Reported on 03/09/2017) 1 each 5  . naproxen (NAPROSYN) 500 MG tablet Take 1 tablet (500 mg total) by mouth 2 (two) times daily with a meal. (Patient not taking: Reported on 03/09/2017) 30 tablet 0  . rosuvastatin (CRESTOR) 40 MG tablet Take 1 tablet (40 mg total) by mouth daily. (Patient not taking: Reported on 03/09/2017) 90 tablet 3   Current Facility-Administered Medications  Medication Dose Route Frequency Provider Last Rate Last Dose  . omalizumab (Arvid Right injection 300 mg  300 mg Subcutaneous Q14 Days KFlora Lipps MD   300 mg at 06/15/16 1209    Review of Systems:  GENERAL:  Fatigue.  Exhausted all week.  No fevers.  Drenching night sweats x 2-3 months.  Weight up 4 pounds since last visit. PERFORMANCE STATUS (ECOG):  1 HEENT: Allergies.  No visual changes, sore throat, mouth sores or tenderness. Lungs: No shortness of breath or cough.  No hemoptysis. Cardiac:  No chest pain, palpitations, orthopnea, or PND. GI:  Upper abdominal pain.  Constipation.  No nausea,  vomiting, diarrhea, melena or hematochezia. Guaiac + stool (1 of 2).  EGD and colonoscopy 03/13/2017. GU:  No urgency, frequency, dysuria, or hematuria. Musculoskeletal:  No back pain.  No joint pain.  No muscle tenderness. Extremities:  No pain or swelling. Skin:  No rashes or skin changes.  After bath itching x 2 weeks. Neuro:  No headache, numbness or weakness, balance or coordination issues. Endocrine:  No diabetes.  Thyroid disease on Synthroid.  No hot flashes.  Menopause at age 46 Psych:  No mood changes, depression or anxiety. Pain:  No focal pain. Review of systems:  All  other systems reviewed and found to be negative.  Physical Exam: 157 Blood pressure (!) 145/92, pulse 87, temperature (!) 96.8 F (36 C), temperature source Tympanic, resp. rate 18, weight 161 lb (73 kg). GENERAL:  Well developed, well nourished, woman sitting comfortably in the exam room in no acute distress. MENTAL STATUS:  Alert and oriented to person, place and time. HEAD:  Short frosted hair.  Normocephalic, atraumatic, face symmetric, no Cushingoid features. EYES:  Light brown eyes.  No conjunctivitis or scleral icterus. NEUROLOGICAL: Unremarkable. PSYCH:  Appropriate.   No visits with results within 3 Day(s) from this visit.  Latest known visit with results is:  Appointment on 03/09/2017  Component Date Value Ref Range Status  . WBC 03/09/2017 5.1  3.6 - 11.0 K/uL Final  . RBC 03/09/2017 5.12  3.80 - 5.20 MIL/uL Final  . Hemoglobin 03/09/2017 15.1  12.0 - 16.0 g/dL Final  . HCT 03/09/2017 42.8  35.0 - 47.0 % Final  . MCV 03/09/2017 83.6  80.0 - 100.0 fL Final  . MCH 03/09/2017 29.4  26.0 - 34.0 pg Final  . MCHC 03/09/2017 35.2  32.0 - 36.0 g/dL Final  . RDW 03/09/2017 13.3  11.5 - 14.5 % Final  . Platelets 03/09/2017 292  150 - 440 K/uL Final  . Neutrophils Relative % 03/09/2017 52  % Final  . Neutro Abs 03/09/2017 2.7  1.4 - 6.5 K/uL Final  . Lymphocytes Relative 03/09/2017 36  % Final  .  Lymphs Abs 03/09/2017 1.8  1.0 - 3.6 K/uL Final  . Monocytes Relative 03/09/2017 7  % Final  . Monocytes Absolute 03/09/2017 0.3  0.2 - 0.9 K/uL Final  . Eosinophils Relative 03/09/2017 4  % Final  . Eosinophils Absolute 03/09/2017 0.2  0 - 0.7 K/uL Final  . Basophils Relative 03/09/2017 1  % Final  . Basophils Absolute 03/09/2017 0.0  0 - 0.1 K/uL Final  . Sodium 03/09/2017 135  135 - 145 mmol/L Final  . Potassium 03/09/2017 4.2  3.5 - 5.1 mmol/L Final  . Chloride 03/09/2017 102  101 - 111 mmol/L Final  . CO2 03/09/2017 27  22 - 32 mmol/L Final  . Glucose, Bld 03/09/2017 127* 65 - 99 mg/dL Final  . BUN 03/09/2017 21* 6 - 20 mg/dL Final  . Creatinine, Ser 03/09/2017 0.88  0.44 - 1.00 mg/dL Final  . Calcium 03/09/2017 9.9  8.9 - 10.3 mg/dL Final  . Total Protein 03/09/2017 8.0  6.5 - 8.1 g/dL Final  . Albumin 03/09/2017 5.0  3.5 - 5.0 g/dL Final  . AST 03/09/2017 22  15 - 41 U/L Final  . ALT 03/09/2017 26  14 - 54 U/L Final  . Alkaline Phosphatase 03/09/2017 62  38 - 126 U/L Final  . Total Bilirubin 03/09/2017 0.7  0.3 - 1.2 mg/dL Final  . GFR calc non Af Amer 03/09/2017 >60  >60 mL/min Final  . GFR calc Af Amer 03/09/2017 >60  >60 mL/min Final   Comment: (NOTE) The eGFR has been calculated using the CKD EPI equation. This calculation has not been validated in all clinical situations. eGFR's persistently <60 mL/min signify possible Chronic Kidney Disease.   . Anion gap 03/09/2017 6  5 - 15 Final  . LDH 03/09/2017 128  98 - 192 U/L Final  . Uric Acid, Serum 03/09/2017 5.0  2.3 - 6.6 mg/dL Final    Assessment:  Auria E Peyton is a 46 y.o. female with 2-3 month history of fatigue, weight loss,  drenching night sweats, and after bath itching worrisome for lymphoma.  She has upper abdominal pain.  One of 2 guaiac cards were positive.    Abdominal ultrasound on 01/16/2017 revealed a fatty slightly elongated liver and top normal size spleen (12.2 cm; volume 411 cc).  Abdominal and  pelvic CT on 03/14/2017 revealed no acute findings.  Spleen was normal.  Chest x-ray on 01/09/2017 was normal.  CBC on 01/01/2017 included a hematocrit of 44.8, hemoglobin 15.1, MCV 85.8, platelets 312,000, white count 4800 with an ANC of 2200. Differential was unremarkable.  Iron studies on 02/13/2017 were normal.  She was recently diagnosed with sleep apnea.  CPAP has not helped her fatigue.   She has a poor appetite and abdominal discomfort. She underwent EGD and colonoscopy on 03/13/2017 (no results are available).   Plan: 1.  Review labs from 03/09/2017.  CBC, CMP, and LDH are normal. 2.  Review abdominal imaging.  EGD and colonoscopy report unavailable. 3.  Discuss performing a work-up of erythrocytosis if hemoglobin > 16. 4.  Discuss consideration of chest CT to r/o adenopathy if concern for weight loss and B symptoms (CXR was negative). 5.  RTC in 3 month MD assessment and lavs (CBC with diff, CMP, LDH, uric acid)   Lequita Asal, MD  03/19/2017, 10:50 AM

## 2017-03-21 ENCOUNTER — Other Ambulatory Visit: Payer: Self-pay | Admitting: Internal Medicine

## 2017-03-22 ENCOUNTER — Encounter: Payer: Self-pay | Admitting: Family

## 2017-03-25 DIAGNOSIS — G4733 Obstructive sleep apnea (adult) (pediatric): Secondary | ICD-10-CM | POA: Diagnosis not present

## 2017-04-24 ENCOUNTER — Encounter: Payer: Self-pay | Admitting: Family

## 2017-04-24 ENCOUNTER — Ambulatory Visit (INDEPENDENT_AMBULATORY_CARE_PROVIDER_SITE_OTHER): Payer: 59 | Admitting: Family

## 2017-04-24 ENCOUNTER — Other Ambulatory Visit (HOSPITAL_COMMUNITY)
Admission: RE | Admit: 2017-04-24 | Discharge: 2017-04-24 | Disposition: A | Discharge status: Home / Self Care | Payer: 59 | Source: Ambulatory Visit | Attending: Family | Admitting: Family

## 2017-04-24 VITALS — BP 144/82 | HR 80 | Temp 98.6°F | Resp 12 | Ht 62.0 in | Wt 158.4 lb

## 2017-04-24 DIAGNOSIS — G4733 Obstructive sleep apnea (adult) (pediatric): Secondary | ICD-10-CM | POA: Insufficient documentation

## 2017-04-24 DIAGNOSIS — I10 Essential (primary) hypertension: Secondary | ICD-10-CM

## 2017-04-24 DIAGNOSIS — J4540 Moderate persistent asthma, uncomplicated: Secondary | ICD-10-CM | POA: Diagnosis not present

## 2017-04-24 DIAGNOSIS — F321 Major depressive disorder, single episode, moderate: Secondary | ICD-10-CM | POA: Diagnosis not present

## 2017-04-24 DIAGNOSIS — Z0000 Encounter for general adult medical examination without abnormal findings: Secondary | ICD-10-CM

## 2017-04-24 MED ORDER — PNEUMOCOCCAL 13-VAL CONJ VACC IM SUSP
0.5000 mL | Freq: Once | INTRAMUSCULAR | Status: AC
  Administered 2017-04-24: 0.5 mL via INTRAMUSCULAR

## 2017-04-24 MED ORDER — BUPROPION HCL ER (XL) 150 MG PO TB24: 150.0000 mg | ORAL_TABLET | Freq: Two times a day (BID) | ORAL | 3 refills | Status: DC

## 2017-04-24 NOTE — Patient Instructions (Signed)
Start Wellbutrin Follow-up in a couple months to see how you are doing Labs Let me know do not get part for cipap for new mask  We placed a referral. Mammogram this year. I asked that you call one the below locations and schedule this when it is convenient for you.   If you have dense breasts, you may ask for 3D mammogram over the traditional 2D mammogram as new evidence suggest 3D is superior. Please note that NOT all insurance companies cover 3D and you may have to pay a higher copay. You may call your insurance company to further clarify your benefits.   Options for [place]  [place], [place]  * Offers [facility]ogram ; [facility] [place], [place] * Note if you ask for 3D mammogram at this location, you must request [place], [place] location*   Health Maintenance, Female Adopting a healthy lifestyle and getting preventive care can go a long way to promote health and wellness. Talk with your health care provider about what schedule of regular examinations is right for you. This is a good chance for you to check in with your provider about disease prevention and staying healthy. In between checkups, there are plenty of things you can do on your own. Experts have done a lot of research about which lifestyle changes and preventive measures are most likely to keep you healthy. Ask your health care provider for more information. Weight and diet Eat a healthy diet  Be sure to include plenty of vegetables, fruits, low-fat dairy products, and lean protein.  Do not eat a lot of foods high in solid fats, added sugars, or salt.  Get regular exercise. This is one of the most important things you can do for your health. ? Most adults should exercise for at least 150 minutes each week. The exercise should increase your heart rate and make you sweat  (moderate-intensity exercise). ? Most adults should also do strengthening exercises at least twice a week. This is in addition to the moderate-intensity exercise.  Maintain a healthy weight  Body mass index (BMI) is a measurement that can be used to identify possible weight problems. It estimates body fat based on height and weight. Your health care provider can help determine your BMI and help you achieve or maintain a healthy weight.  For females 42 years of age and older: ? A BMI below 18.5 is considered underweight. ? A BMI of 18.5 to 24.9 is normal. ? A BMI of 25 to 29.9 is considered overweight. ? A BMI of 30 and above is considered obese.  Watch levels of cholesterol and blood lipids  You should start having your blood tested for lipids and cholesterol at 46 years of age, then have this test every 5 years.  You may need to have your cholesterol levels checked more often if: ? Your lipid or cholesterol levels are high. ? You are older than 46 years of age. ? You are at high risk for heart disease.  Cancer screening Lung Cancer  Lung cancer screening is recommended for adults 30-67 years old who are at high risk for lung cancer because of a history of smoking.  A yearly low-dose CT scan of the lungs is recommended for people who: ? Currently smoke. ? Have quit within the past 15 years. ? Have at least a 30-pack-year history of smoking.  A pack year is smoking an average of one pack of cigarettes a day for 1 year.  Yearly screening should continue until it has been 15 years since you quit.  Yearly screening should stop if you develop a health problem that would prevent you from having lung cancer treatment.  Breast Cancer  Practice breast self-awareness. This means understanding how your breasts normally appear and feel.  It also means doing regular breast self-exams. Let your health care provider know about any changes, no matter how small.  If you are in your 20s or  30s, you should have a clinical breast exam (CBE) by a health care provider every 1-3 years as part of a regular health exam.  If you are 78 or older, have a CBE every year. Also consider having a breast X-ray (mammogram) every year.  If you have a family history of breast cancer, talk to your health care provider about genetic screening.  If you are at high risk for breast cancer, talk to your health care provider about having an MRI and a mammogram every year.  Breast cancer gene (BRCA) assessment is recommended for women who have family members with BRCA-related cancers. BRCA-related cancers include: ? Breast. ? Ovarian. ? Tubal. ? Peritoneal cancers.  Results of the assessment will determine the need for genetic counseling and BRCA1 and BRCA2 testing.  Cervical Cancer Your health care provider may recommend that you be screened regularly for cancer of the pelvic organs (ovaries, uterus, and vagina). This screening involves a pelvic examination, including checking for microscopic changes to the surface of your cervix (Pap test). You may be encouraged to have this screening done every 3 years, beginning at age 75.  For women ages 48-65, health care providers may recommend pelvic exams and Pap testing every 3 years, or they may recommend the Pap and pelvic exam, combined with testing for human papilloma virus (HPV), every 5 years. Some types of HPV increase your risk of cervical cancer. Testing for HPV may also be done on women of any age with unclear Pap test results.  Other health care providers may not recommend any screening for nonpregnant women who are considered low risk for pelvic cancer and who do not have symptoms. Ask your health care provider if a screening pelvic exam is right for you.  If you have had past treatment for cervical cancer or a condition that could lead to cancer, you need Pap tests and screening for cancer for at least 20 years after your treatment. If Pap tests  have been discontinued, your risk factors (such as having a new sexual partner) need to be reassessed to determine if screening should resume. Some women have medical problems that increase the chance of getting cervical cancer. In these cases, your health care provider may recommend more frequent screening and Pap tests.  Colorectal Cancer  This type of cancer can be detected and often prevented.  Routine colorectal cancer screening usually begins at 45 years of age and continues through 46 years of age.  Your health care provider may recommend screening at an earlier age if you have risk factors for colon cancer.  Your health care provider may also recommend using home test kits to check for hidden blood in the stool.  A small camera at the end of a tube can be used to examine your colon directly (sigmoidoscopy or colonoscopy). This is done to check for the earliest forms of colorectal cancer.  Routine screening usually begins at age  50.  Direct examination of the colon should be repeated every 5-10 years through 46 years of age. However, you may need to be screened more often if early forms of precancerous polyps or small growths are found.  Skin Cancer  Check your skin from head to toe regularly.  Tell your health care provider about any new moles or changes in moles, especially if there is a change in a mole's shape or color.  Also tell your health care provider if you have a mole that is larger than the size of a pencil eraser.  Always use sunscreen. Apply sunscreen liberally and repeatedly throughout the day.  Protect yourself by wearing long sleeves, pants, a wide-brimmed hat, and sunglasses whenever you are outside.  Heart disease, diabetes, and high blood pressure  High blood pressure causes heart disease and increases the risk of stroke. High blood pressure is more likely to develop in: ? People who have blood pressure in the high end of the normal range (130-139/85-89 mm  Hg). ? People who are overweight or obese. ? People who are African American.  If you are 60-68 years of age, have your blood pressure checked every 3-5 years. If you are 62 years of age or older, have your blood pressure checked every year. You should have your blood pressure measured twice-once when you are at a hospital or clinic, and once when you are not at a hospital or clinic. Record the average of the two measurements. To check your blood pressure when you are not at a hospital or clinic, you can use: ? An automated blood pressure machine at a pharmacy. ? A home blood pressure monitor.  If you are between 12 years and 80 years old, ask your health care provider if you should take aspirin to prevent strokes.  Have regular diabetes screenings. This involves taking a blood sample to check your fasting blood sugar level. ? If you are at a normal weight and have a low risk for diabetes, have this test once every three years after 46 years of age. ? If you are overweight and have a high risk for diabetes, consider being tested at a younger age or more often. Preventing infection Hepatitis B  If you have a higher risk for hepatitis B, you should be screened for this virus. You are considered at high risk for hepatitis B if: ? You were born in a country where hepatitis B is common. Ask your health care provider which countries are considered high risk. ? Your parents were born in a high-risk country, and you have not been immunized against hepatitis B (hepatitis B vaccine). ? You have HIV or AIDS. ? You use needles to inject street drugs. ? You live with someone who has hepatitis B. ? You have had sex with someone who has hepatitis B. ? You get hemodialysis treatment. ? You take certain medicines for conditions, including cancer, organ transplantation, and autoimmune conditions.  Hepatitis C  Blood testing is recommended for: ? Everyone born from [birth year] through 1965. ? Anyone with known  risk factors for hepatitis C.  Sexually transmitted infections (STIs)  You should be screened for sexually transmitted infections (STIs) including gonorrhea and chlamydia if: ? You are sexually active and are younger than 46 years of age. ? You are older than 46 years of age and your health care provider tells you that you are at risk for this type of infection. ? Your sexual activity has changed since you were last  screened and you are at an increased risk for chlamydia or gonorrhea. Ask your health care provider if you are at risk.  If you do not have HIV, but are at risk, it may be recommended that you take a prescription medicine daily to prevent HIV infection. This is called pre-exposure prophylaxis (PrEP). You are considered at risk if: ? You are sexually active and do not regularly use condoms or know the HIV status of your partner(s). ? You take drugs by injection. ? You are sexually active with a partner who has HIV.  Talk with your health care provider about whether you are at high risk of being infected with HIV. If you choose to begin PrEP, you should first be tested for HIV. You should then be tested every 3 months for as long as you are taking PrEP. Pregnancy  If you are premenopausal and you may become pregnant, ask your health care provider about preconception counseling.  If you may become pregnant, take 400 to 800 micrograms (mcg) of folic acid every day.  If you want to prevent pregnancy, talk to your health care provider about birth control (contraception). Osteoporosis and menopause  Osteoporosis is a disease in which the bones lose minerals and strength with aging. This can result in serious bone fractures. Your risk for osteoporosis can be identified using a bone density scan.  If you are 69 years of age or older, or if you are at risk for osteoporosis and fractures, ask your health care provider if you should be screened.  Ask your health care provider whether you  should take a calcium or vitamin D supplement to lower your risk for osteoporosis.  Menopause may have certain physical symptoms and risks.  Hormone replacement therapy may reduce some of these symptoms and risks. Talk to your health care provider about whether hormone replacement therapy is right for you. Follow these instructions at home:  Schedule regular health, dental, and eye exams.  Stay current with your immunizations.  Do not use any tobacco products including cigarettes, chewing tobacco, or electronic cigarettes.  If you are pregnant, do not drink alcohol.  If you are breastfeeding, limit how much and how often you drink alcohol.  Limit alcohol intake to no more than 1 drink per day for nonpregnant women. One drink equals 12 ounces of beer, 5 ounces of wine, or 1 ounces of hard liquor.  Do not use street drugs.  Do not share needles.  Ask your health care provider for help if you need support or information about quitting drugs.  Tell your health care provider if you often feel depressed.  Tell your health care provider if you have ever been abused or do not feel safe at home. This information is not intended to replace advice given to you by your health care provider. Make sure you discuss any questions you have with your health care provider. Document Released: 04/10/2011 Document Revised: 03/02/2016 Document Reviewed: 06/29/2015 Elsevier Interactive Patient Education  [employer].

## 2017-04-24 NOTE — Assessment & Plan Note (Addendum)
2.5 months, inconsistent. No improvement in fatigue however patient and I jointly agreed we should get this more time to see if helps with fatigue.

## 2017-04-24 NOTE — Assessment & Plan Note (Signed)
Prevnar ordered for patient a long-standing history of intermittent prednisone use and asthma.

## 2017-04-24 NOTE — Progress Notes (Signed)
Subjective:    Patient ID: Bridget Peters, Peters    DOB: Sep 28, 1971, 46 y.o.   MRN: [id number]  CC: Bridget Peters who presents today for physical exam.    HPI: OSA- recently diagnosed. Started cipap. Inconsistent. Hasn't noticed any change and fatigue.   Depression and anxiety- doing well on zoloft. Anxiety has improved. Feels like concentration is poor over past couple months. No thoughts of hurting herself or anyone else      6/11 Bridget Peters - concern for lymphoma. Declined CT chest. CXR normal 01/2017. CT abdomen 03/2017 showed no acute findings, no lymphadenopathy or lytic lesions.   02/2017 GI- Bridget Peters - full serologic workup. Offered referral to NAFLD at [facility].   EGD and colonoscopy 03/13/2017 Dr Bridget Peters- unable to see records of this. Per patient, gastritis. Told to come back in 3 years for repeat colonoscopy.   Breast Cancer Screening: Mammogram due Cervical Cancer Screening: due Bone Health screening/DEXA for 65+: No increased fracture risk. Defer screening at this time Lung Cancer Screening: Doesn't have 30 year pack year history and age > 54 years.       Tetanus - utd        Pneumococcal - denies; never done.  Labs: Screening labs today. Exercise: Gets regular exercise.  Alcohol use: rare Smoking/tobacco use: Nonsmoker.  Regular dental exams: UTD Wears seat belt: Yes. Skin: no h/o skin cancer  HISTORY:  Past Medical History:  Diagnosis Date  . Allergy    Seasonal  . Asthma   . Depression   . Diabetes mellitus without complication ([place])    Pt takes Metformin.  Marland Kitchen Hypertension   . Pneumonia   . Shortness of breath   . Sleep apnea   . Thyroid disease     Past Surgical History:  Procedure Laterality Date  . BREAST SURGERY  01/2013   Breast Reduction   . NASAL SINUS SURGERY    . NASAL SINUS SURGERY  02/2014  . REDUCTION MAMMAPLASTY Bilateral 2014   Family History  Problem Relation Age of Onset  . Hypertension Mother   .  Hypertension Father   . Colon polyps Father   . Cancer Maternal Aunt        Breast Cancer  . Breast cancer Maternal Aunt   . Breast cancer Paternal Aunt        Breast Cancer  . Cancer Maternal Grandmother 41       Bladder Cancer  . Stroke Maternal Grandmother   . Breast cancer Maternal Grandmother 60  . Cancer Maternal Grandfather 77       Esophagus Cancer  . Colon cancer Neg Hx   . Ovarian cancer Neg Hx       ALLERGIES: Milk-related compounds; Shellfish allergy; Wheat bran; Xolair [omalizumab]; and Isovue [iopamidol]  Current Outpatient Prescriptions on File Prior to Visit  Medication Sig Dispense Refill  . albuterol (PROVENTIL HFA;VENTOLIN HFA) 108 (90 Base) MCG/ACT inhaler Inhale 2 puffs into the lungs every 4 (four) hours as needed for wheezing or shortness of breath. 1 Inhaler 6  . albuterol (PROVENTIL) (2.5 MG/3ML) 0.083% nebulizer solution Take 3 mLs (2.5 mg total) by nebulization every 6 (six) hours as needed for wheezing or shortness of breath. 75 mL 12  . baclofen (LIORESAL) 10 MG tablet Take 1 tablet (10 mg total) by mouth 3 (three) times daily. (Patient not taking: Reported on 03/09/2017) 30 each 0  . canagliflozin (INVOKANA) 300 MG TABS tablet Take 1 tablet (300  mg total) by mouth daily. 90 tablet 3  . cetirizine (ZYRTEC) 10 MG tablet Take 1 tablet (10 mg total) by mouth daily. (Patient not taking: Reported on 03/09/2017) 30 tablet 5  . fexofenadine (ALLEGRA) 180 MG tablet Take 180 mg by mouth daily.  11  . fluticasone (FLONASE) 50 MCG/ACT nasal spray Place 2 sprays into both nostrils daily. 16 g 1  . fluticasone (VERAMYST) 27.5 MCG/SPRAY nasal spray Place 2 sprays into the nose daily. 10 g 12  . fluticasone furoate-vilanterol (BREO ELLIPTA) 200-25 MCG/INH AEPB Inhale 1 puff into the lungs daily. 60 each 5  . levocetirizine (XYZAL) 5 MG tablet Take 1 tablet (5 mg total) by mouth every evening. (Patient not taking: Reported on 03/09/2017) 30 tablet 1  . levothyroxine (SYNTHROID,  LEVOTHROID) 75 MCG tablet Take 1 tablet (75 mcg total) by mouth daily. 90 tablet 3  . lisinopril (PRINIVIL,ZESTRIL) 5 MG tablet Take 1 tablet (5 mg total) by mouth daily. 90 tablet 3  . Mepolizumab (NUCALA) 100 MG SOLR Inject 100 mg into the skin every 30 (thirty) days. (Patient not taking: Reported on 03/09/2017) 1 each 5  . metFORMIN (GLUCOPHAGE XR) 500 MG 24 hr tablet Start 500mg  PO qpm. 90 tablet 3  . montelukast (SINGULAIR) 10 MG tablet TAKE 1 TABLET (10 MG TOTAL) BY MOUTH AT BEDTIME. 30 tablet 5  . naproxen (NAPROSYN) 500 MG tablet Take 1 tablet (500 mg total) by mouth 2 (two) times daily with a meal. (Patient not taking: Reported on 03/09/2017) 30 tablet 0  . pantoprazole (PROTONIX) 40 MG tablet TAKE 1 TABLET BY MOUTH DAILY. 30 tablet 5  . rosuvastatin (CRESTOR) 40 MG tablet Take 1 tablet (40 mg total) by mouth daily. (Patient not taking: Reported on 03/09/2017) 90 tablet 3  . sertraline (ZOLOFT) 100 MG tablet Take 1 tablet (100 mg total) by mouth at bedtime. 90 tablet 1   Current Facility-Administered Medications on File Prior to Visit  Medication Dose Route Frequency Provider Last Rate Last Dose  . omalizumab Arvid Right) injection 300 mg  300 mg Subcutaneous Q14 Days Flora Lipps, MD   300 mg at 06/15/16 1209    Social History  Substance Use Topics  . Smoking status: Never Smoker  . Smokeless tobacco: Never Used  . Alcohol use 0.0 oz/week    Review of Systems  Constitutional: Positive for fatigue. Negative for chills, fever and unexpected weight change.  HENT: Negative for congestion.   Respiratory: Negative for cough.   Cardiovascular: Negative for chest pain, palpitations and leg swelling.  Gastrointestinal: Negative for nausea and vomiting.  Musculoskeletal: Negative for arthralgias and myalgias.  Skin: Negative for rash.  Neurological: Negative for headaches.  Hematological: Negative for adenopathy.  Psychiatric/Behavioral: Negative for confusion and sleep disturbance. The patient  is nervous/anxious.       Objective:    BP (!) 144/82 (BP Location: Left Arm, Cuff Size: Normal)   Pulse 80   Temp 98.6 F (37 C) (Oral)   Resp 12   Ht 5\' 2"  (1.575 m)   Wt 158 lb 6.4 oz (71.8 kg)   SpO2 97%   BMI 28.97 kg/m   BP Readings from Last 3 Encounters:  04/24/17 (!) 144/82  03/19/17 (!) 145/92  03/09/17 123/80   Wt Readings from Last 3 Encounters:  04/24/17 158 lb 6.4 oz (71.8 kg)  03/19/17 161 lb (73 kg)  03/09/17 157 lb 5 oz (71.4 kg)    Physical Exam  Constitutional: She appears well-developed and well-nourished.  Eyes: Conjunctivae are normal.  Neck: No thyroid mass and no thyromegaly present.  Cardiovascular: Normal rate, regular rhythm, normal heart sounds and normal pulses.   Pulmonary/Chest: Effort normal and breath sounds normal. She has no wheezes. She has no rhonchi. She has no rales. Right breast exhibits no inverted nipple, no mass, no nipple discharge, no skin change and no tenderness. Left breast exhibits no inverted nipple, no mass, no nipple discharge, no skin change and no tenderness. Breasts are symmetrical.  No masses or asymmetry appreciated during CBE.  Genitourinary: Uterus is not enlarged, not fixed and not tender. Cervix exhibits no motion tenderness, no discharge and no friability. Right adnexum displays no mass, no tenderness and no fullness. Left adnexum displays no mass, no tenderness and no fullness.  Genitourinary Comments: Pap performed. No CMT. Unable to appreciated ovaries.  Lymphadenopathy:       Head (right side): No submental, no submandibular, no tonsillar, no preauricular, no posterior auricular and no occipital adenopathy present.       Head (left side): No submental, no submandibular, no tonsillar, no preauricular, no posterior auricular and no occipital adenopathy present.       Right cervical: No superficial cervical, no deep cervical and no posterior cervical adenopathy present.      Left cervical: No superficial cervical,  no deep cervical and no posterior cervical adenopathy present.    She has no axillary adenopathy.       Right axillary: No pectoral and no lateral adenopathy present.       Left axillary: No pectoral and no lateral adenopathy present. Neurological: She is alert.  Skin: Skin is warm and dry.  Psychiatric: She has a normal mood and affect. Her speech is normal and behavior is normal. Thought content normal.  Vitals reviewed.      Assessment & Plan:   Problem List Items Addressed This Visit      Cardiovascular and Mediastinum   HTN (hypertension)    Elevated today. Advised to increase lisinopril to 10mg  and recheck electrolytes in 5 days. Will follow        Respiratory   Asthma, moderate persistent    Prevnar ordered for patient a long-standing history of intermittent prednisone use and asthma.      Relevant Medications   pneumococcal 13-valent conjugate vaccine (PREVNAR 13) injection 0.5 mL (Completed)   OSA (obstructive sleep apnea)    2.5 months, inconsistent. No improvement in fatigue however patient and I jointly agreed we should get this more time to see if helps with fatigue.      Relevant Orders   Ambulatory referral to Sleep Studies     Other   Routine physical examination - Primary    Pap and clinical breast exam performed today. Screening labs ordered. Mammogram ordered and patient understands to schedule 3-D. Prevnar vaccine given due to history of asthma, prednisone use. DEXA Bone scan also ordered due to history of prednisone use. Encouraged exercise.      Relevant Orders   CBC with Differential/Platelet   Comprehensive metabolic panel   Hemoglobin A1c   Lipid panel   Cytology - PAP   VITAMIN D 25 Hydroxy (Vit-D Deficiency, Fractures)   TSH   MM SCREENING BREAST TOMO BILATERAL   DG Bone Density   Ambulatory referral to Dermatology   Depression    Start Wellbutrin to see if this helps with fatigue, concentration. Follow-up in a couple months.       Relevant Medications   buPROPion (WELLBUTRIN XL)  150 MG 24 hr tablet       I am having Ms. Viverette start on buPROPion. I am also having her maintain her fluticasone, albuterol, albuterol, levothyroxine, canagliflozin, metFORMIN, rosuvastatin, levocetirizine, fluticasone, cetirizine, baclofen, naproxen, Mepolizumab, fluticasone furoate-vilanterol, sertraline, lisinopril, fexofenadine, pantoprazole, and montelukast. We administered pneumococcal 13-valent conjugate vaccine. We will continue to administer omalizumab.   Meds ordered this encounter  Medications  . buPROPion (WELLBUTRIN XL) 150 MG 24 hr tablet    Sig: Take 1 tablet (150 mg total) by mouth 2 (two) times daily. Start 150 mg ER PO qam, increase after 3 days to 150 mg ER PO BID.    Dispense:  60 tablet    Refill:  3    Order Specific Question:   Supervising Provider    Answer:   Deborra Medina L [[id number]]  . pneumococcal 13-valent conjugate vaccine (PREVNAR 13) injection 0.5 mL    Return precautions given.   Risks, benefits, and alternatives of the medications and treatment plan prescribed today were discussed, and patient expressed understanding.   Education regarding symptom management and diagnosis given to patient on AVS.   Continue to follow with Burnard Hawthorne, FNP for routine health maintenance.   Janeann E Czerniak and I agreed with plan.   Mable Paris, FNP

## 2017-04-24 NOTE — Assessment & Plan Note (Addendum)
Pap and clinical breast exam performed today. Screening labs ordered. Mammogram ordered and patient understands to schedule 3-D. Prevnar vaccine given due to history of asthma, prednisone use. DEXA Bone scan also ordered due to history of prednisone use. Encouraged exercise.

## 2017-04-24 NOTE — Progress Notes (Signed)
Pre-visit discussion using our clinic review tool. No additional management support is needed unless otherwise documented below in the visit note.  

## 2017-04-24 NOTE — Assessment & Plan Note (Signed)
Start Wellbutrin to see if this helps with fatigue, concentration. Follow-up in a couple months.

## 2017-04-25 LAB — CYTOLOGY - PAP
DIAGNOSIS: NEGATIVE
HPV: NOT DETECTED

## 2017-04-25 NOTE — Assessment & Plan Note (Signed)
Elevated today. Advised to increase lisinopril to 10mg  and recheck electrolytes in 5 days. Will follow

## 2017-04-26 ENCOUNTER — Telehealth: Payer: Self-pay | Admitting: Internal Medicine

## 2017-04-26 NOTE — Telephone Encounter (Signed)
Please call regarding rx Nucala. Reference # [id number]

## 2017-04-26 NOTE — Telephone Encounter (Signed)
Informed rep that information requested had been faxed back in this morning along with additional requested information that was requested. Rep states she will make note. Nothing further needed.

## 2017-04-26 NOTE — Progress Notes (Signed)
Patient has been informed. Also she stated she will return call to schedule lab appointment.

## 2017-04-29 ENCOUNTER — Encounter: Payer: Self-pay | Admitting: Family

## 2017-04-30 NOTE — Telephone Encounter (Signed)
Hey Bridget Peters, [employer] approved your nucala. Please call [facility] at [phone] and give them the infromation they need so you can get your shots. They can tell you your cost. If you need co-pay assistance call [facility] [place] they can help you with that. If you have any questions for me just e-mail me back or call [phone] and ask for [facility].  Thanks!

## 2017-05-01 NOTE — Telephone Encounter (Signed)
Called pt. To let her she needs to make an appt. For her nucala inj.. Made her aware there is a 2 hr. Wait and if she doesn't have an Epi-pen I need to send the rx to [facility]. You need to bring your Epi-pen in and let me see it. If you have any further questions please call [phone] and ask for [employer] or e-mail.

## 2017-05-01 NOTE — Telephone Encounter (Signed)
I spoke with [name] , ([address] today and she said your part is $200. Did you speak to a speciality nurse either at [facility] or go thru HR? You should be able to pay considerably less. Do you have an Epi-pen, if not let me know, and I'll send it in. Once we set up your appt., it will be a 2 hr. Wait. I will check on you every 20 mins.. If you have any questions let me know. You'll come every 4 wks. For your nucala shot.

## 2017-05-02 ENCOUNTER — Other Ambulatory Visit: Payer: Self-pay | Admitting: Family

## 2017-05-02 DIAGNOSIS — N952 Postmenopausal atrophic vaginitis: Secondary | ICD-10-CM

## 2017-05-02 DIAGNOSIS — G4733 Obstructive sleep apnea (adult) (pediatric): Secondary | ICD-10-CM | POA: Diagnosis not present

## 2017-05-02 DIAGNOSIS — R238 Other skin changes: Secondary | ICD-10-CM | POA: Diagnosis not present

## 2017-05-02 MED ORDER — ESTRADIOL 0.1 MG/GM VA CREA: TOPICAL_CREAM | VAGINAL | 0 refills | Status: DC

## 2017-05-04 ENCOUNTER — Telehealth: Payer: Self-pay | Admitting: Internal Medicine

## 2017-05-04 NOTE — Telephone Encounter (Signed)
Called in Epi-Pen rx to [facility]. With 11 refills today. I'm not comfortable with the electronic scripts yet. Nothing further needed, closing.

## 2017-05-04 NOTE — Telephone Encounter (Signed)
Hey Bridget Peters, sorry we've been playing phone tag. Please call the office and ask to speak to me first, if I"m not available ask them to make your first nucala appointment. There is a 2 hr. Wait. I wil will check on you every 15 to 20 minutes. I called in your Epi-Pen to [facility]. I left you a message on your home phone.

## 2017-05-08 ENCOUNTER — Other Ambulatory Visit: Payer: Self-pay | Admitting: Family

## 2017-05-08 DIAGNOSIS — Z1231 Encounter for screening mammogram for malignant neoplasm of breast: Secondary | ICD-10-CM

## 2017-05-09 ENCOUNTER — Encounter: Payer: Self-pay | Admitting: Family

## 2017-05-09 DIAGNOSIS — L281 Prurigo nodularis: Secondary | ICD-10-CM | POA: Diagnosis not present

## 2017-05-09 DIAGNOSIS — L2389 Allergic contact dermatitis due to other agents: Secondary | ICD-10-CM | POA: Diagnosis not present

## 2017-05-09 NOTE — Telephone Encounter (Signed)
Called pt. And lmtcb with voicemail. I have e-mailed pt. Twice and haven't heard from her. So I called her today to ask her to please call and set up her 1st nucala appt.. Will wait for pt.'s call or e-mail.

## 2017-05-09 NOTE — Telephone Encounter (Signed)
Pt. Called me back. I made her 1st nucala appt. (2 hr. Wait, pt. Is aware). I reminder her to bring her Epi-pen with her. Closing, nothing further needed.

## 2017-05-10 ENCOUNTER — Ambulatory Visit: Payer: Self-pay | Admitting: Physician Assistant

## 2017-05-10 ENCOUNTER — Encounter: Payer: Self-pay | Admitting: Physician Assistant

## 2017-05-10 VITALS — BP 140/90 | HR 82 | Temp 98.1°F

## 2017-05-10 DIAGNOSIS — R21 Rash and other nonspecific skin eruption: Secondary | ICD-10-CM

## 2017-05-10 MED ORDER — METHYLPREDNISOLONE 4 MG PO TBPK: ORAL_TABLET | ORAL | 0 refills | Status: DC

## 2017-05-10 MED ORDER — DEXAMETHASONE SODIUM PHOSPHATE 10 MG/ML IJ SOLN: 10.0000 mg | Freq: Once | INTRAMUSCULAR | Status: DC

## 2017-05-10 NOTE — Progress Notes (Signed)
S: c/o rash on chest, back, abdomen, arms, and upper legs, is very itchy, had appointment with dermatology yesterday and was told its contact dermatitis, given a steroid cream, didn't really help, today just feels bad, no fever/chills, no tick bites, no recent travel; no known exposure to scabies, no sore throat  O: vitals wnl, nad, skin with small red dots on arms, legs, lower back, chest, none on face, no pustules or drainage, no vesicles, n/v intact  A: rash  P: decadron 10mg  Im, medrol dose pack,

## 2017-05-11 ENCOUNTER — Ambulatory Visit
Admission: RE | Admit: 2017-05-11 | Discharge: 2017-05-11 | Disposition: A | Discharge status: Home / Self Care | Payer: 59 | Source: Ambulatory Visit | Attending: Family | Admitting: Family

## 2017-05-11 DIAGNOSIS — Z0000 Encounter for general adult medical examination without abnormal findings: Secondary | ICD-10-CM

## 2017-05-11 DIAGNOSIS — M8580 Other specified disorders of bone density and structure, unspecified site: Secondary | ICD-10-CM | POA: Diagnosis not present

## 2017-05-11 DIAGNOSIS — M85852 Other specified disorders of bone density and structure, left thigh: Secondary | ICD-10-CM | POA: Diagnosis not present

## 2017-05-11 DIAGNOSIS — Z1231 Encounter for screening mammogram for malignant neoplasm of breast: Secondary | ICD-10-CM | POA: Diagnosis not present

## 2017-05-11 DIAGNOSIS — Z13820 Encounter for screening for osteoporosis: Secondary | ICD-10-CM | POA: Diagnosis not present

## 2017-05-11 DIAGNOSIS — Z780 Asymptomatic menopausal state: Secondary | ICD-10-CM | POA: Diagnosis not present

## 2017-05-12 ENCOUNTER — Telehealth: Payer: 59 | Admitting: Nurse Practitioner

## 2017-05-12 DIAGNOSIS — R21 Rash and other nonspecific skin eruption: Secondary | ICD-10-CM

## 2017-05-12 MED ORDER — PREDNISONE 10 MG PO TABS: 10.0000 mg | ORAL_TABLET | Freq: Every day | ORAL | 0 refills | Status: DC

## 2017-05-12 NOTE — Progress Notes (Signed)
E Visit for Rash  We are sorry that you are not feeling well. Here is how we plan to help!  Based on what you shared with me it looks like you have contact dermatitis.  Contact dermatitis is a skin rash caused by something that touches the skin and causes irritation or inflammation.  Your skin may be red, swollen, dry, cracked, and itch.  The rash should go away in a few days but can last a few weeks.  If you get a rash, it's important to figure out what caused it so the irritant can be avoided in the future. and I have prescribed Prednisone 10 mg daily for 5 days.lease note that this may increase your blood sugar while you are taking. I reviewed labs and last hgba1c was 6.6% , so it should not be to much of a problem.   HOME CARE:   Take cool showers and avoid direct sunlight.  Apply cool compress or wet dressings.  Take a bath in an oatmeal bath.  Sprinkle content of one Aveeno packet under running faucet with comfortably warm water.  Bathe for 15-20 minutes, 1-2 times daily.  Pat dry with a towel. Do not rub the rash.  Use hydrocortisone cream.  Take an antihistamine like Benadryl for widespread rashes that itch.  The adult dose of Benadryl is 25-50 mg by mouth 4 times daily.  Caution:  This type of medication may cause sleepiness.  Do not drink alcohol, drive, or operate dangerous machinery while taking antihistamines.  Do not take these medications if you have prostate enlargement.  Read package instructions thoroughly on all medications that you take.  GET HELP RIGHT AWAY IF:   Symptoms don't go away after treatment.  Severe itching that persists.  If you rash spreads or swells.  If you rash begins to smell.  If it blisters and opens or develops a yellow-brown crust.  You develop a fever.  You have a sore throat.  You become short of breath.  MAKE SURE YOU:  Understand these instructions. Will watch your condition. Will get help right away if you are not doing well  or get worse.  Thank you for choosing an e-visit. Your e-visit answers were reviewed by a board certified advanced clinical practitioner to complete your personal care plan. Depending upon the condition, your plan could have included both over the counter or prescription medications. Please review your pharmacy choice. Be sure that the pharmacy you have chosen is open so that you can pick up your prescription now.  If there is a problem you may message your provider in [place] to have the prescription routed to another pharmacy. Your safety is important to [place]. If you have drug allergies check your prescription carefully.  For the next 24 hours, you can use MyChart to ask questions about today's visit, request a non-urgent call back, or ask for a work or school excuse from your e-visit provider. You will get an email in the next two days asking about your experience. I hope that your e-visit has been valuable and will speed your recovery.

## 2017-05-14 ENCOUNTER — Encounter: Payer: Self-pay | Admitting: Family

## 2017-05-14 ENCOUNTER — Ambulatory Visit: Admission: RE | Admit: 2017-05-14 | Payer: 59 | Source: Ambulatory Visit

## 2017-05-14 ENCOUNTER — Ambulatory Visit (INDEPENDENT_AMBULATORY_CARE_PROVIDER_SITE_OTHER): Payer: 59 | Admitting: Family

## 2017-05-14 ENCOUNTER — Other Ambulatory Visit: Payer: Self-pay | Admitting: Family

## 2017-05-14 VITALS — BP 125/60 | HR 84 | Temp 98.2°F | Ht 62.0 in | Wt 158.0 lb

## 2017-05-14 DIAGNOSIS — R21 Rash and other nonspecific skin eruption: Secondary | ICD-10-CM

## 2017-05-14 DIAGNOSIS — I10 Essential (primary) hypertension: Secondary | ICD-10-CM | POA: Diagnosis not present

## 2017-05-14 DIAGNOSIS — M8580 Other specified disorders of bone density and structure, unspecified site: Secondary | ICD-10-CM

## 2017-05-14 DIAGNOSIS — E7800 Pure hypercholesterolemia, unspecified: Secondary | ICD-10-CM | POA: Diagnosis not present

## 2017-05-14 DIAGNOSIS — R5383 Other fatigue: Secondary | ICD-10-CM | POA: Diagnosis not present

## 2017-05-14 DIAGNOSIS — E119 Type 2 diabetes mellitus without complications: Secondary | ICD-10-CM | POA: Diagnosis not present

## 2017-05-14 LAB — CBC WITH DIFFERENTIAL/PLATELET
BASOS PCT: 0 %
Basophils Absolute: 0 cells/uL (ref 0–200)
EOS ABS: 0 {cells}/uL — AB (ref 15–500)
Eosinophils Relative: 0 %
HEMATOCRIT: 40.8 % (ref 35.0–45.0)
HEMOGLOBIN: 14 g/dL (ref 11.7–15.5)
LYMPHS ABS: 970 {cells}/uL (ref 850–3900)
LYMPHS PCT: 10 %
MCH: 29.3 pg (ref 27.0–33.0)
MCHC: 34.3 g/dL (ref 32.0–36.0)
MCV: 85.4 fL (ref 80.0–100.0)
MPV: 9.8 fL (ref 7.5–12.5)
Monocytes Absolute: 291 cells/uL (ref 200–950)
Monocytes Relative: 3 %
NEUTROS ABS: 8439 {cells}/uL — AB (ref 1500–7800)
Neutrophils Relative %: 87 %
Platelets: 321 10*3/uL (ref 140–400)
RBC: 4.78 MIL/uL (ref 3.80–5.10)
RDW: 13.6 % (ref 11.0–15.0)
WBC: 9.7 10*3/uL (ref 3.8–10.8)

## 2017-05-14 LAB — POCT RAPID STREP A (OFFICE): Rapid Strep A Screen: NEGATIVE

## 2017-05-14 MED ORDER — AMLODIPINE BESYLATE 2.5 MG PO TABS: 2.5000 mg | ORAL_TABLET | Freq: Every day | ORAL | 3 refills | Status: DC

## 2017-05-14 MED ORDER — ACYCLOVIR 800 MG PO TABS: 800.0000 mg | ORAL_TABLET | Freq: Four times a day (QID) | ORAL | 0 refills | Status: AC

## 2017-05-14 NOTE — Progress Notes (Signed)
Subjective:    Patient ID: Bridget Peters, female    DOB: 1971/07/03, 46 y.o.   MRN: [id number]  CC: Bridget Peters is a 46 y.o. female who presents today for an acute visit.    HPI: CC:  Rash x 6 days, worsening.  Started on left inside of wrist, then to both arms, then chest and low back, now on legs too.  Endorses HA x 3 days,' annoying' 'not worse HA of life.'   Sore throat today. Fever over weekend tmax 101, 2 days ago, no fever since. Had chills.   Has been taking on benadryl all weekend.   Never chicken pox.   Rash started 6 days ago, seen by dermatologist ( [facility] in [place]) last week and given topical corticosteriod. Seen at the urgent care, given 'shot' which didn't help. Thinks dexamethsone.   Does note 3 weeks ago, had tick on neck. Attached for 30 minutes.   HTN- on lisinopril 10 mg for past 2 weeks ( increased).          HISTORY:  Past Medical History:  Diagnosis Date  . Allergy    Seasonal  . Asthma   . Depression   . Diabetes mellitus without complication ([place])    Pt takes Metformin.  Marland Kitchen Hypertension   . Pneumonia   . Shortness of breath   . Sleep apnea   . Thyroid disease    Past Surgical History:  Procedure Laterality Date  . BREAST SURGERY  01/2013   Breast Reduction   . NASAL SINUS SURGERY    . NASAL SINUS SURGERY  02/2014  . REDUCTION MAMMAPLASTY Bilateral 2014   Family History  Problem Relation Age of Onset  . Hypertension Mother   . Hypertension Father   . Colon polyps Father   . Cancer Maternal Aunt        Breast Cancer  . Breast cancer Maternal Aunt   . Breast cancer Paternal Aunt        Breast Cancer  . Cancer Maternal Grandmother 13       Bladder Cancer  . Stroke Maternal Grandmother   . Breast cancer Maternal Grandmother 60  . Cancer Maternal Grandfather 78       Esophagus Cancer  . Colon cancer Neg Hx   . Ovarian cancer Neg Hx     Allergies: Milk-related compounds; Shellfish allergy; Wheat  bran; Xolair [omalizumab]; and Isovue [iopamidol] Current Outpatient Prescriptions on File Prior to Visit  Medication Sig Dispense Refill  . albuterol (PROVENTIL HFA;VENTOLIN HFA) 108 (90 Base) MCG/ACT inhaler Inhale 2 puffs into the lungs every 4 (four) hours as needed for wheezing or shortness of breath. 1 Inhaler 6  . albuterol (PROVENTIL) (2.5 MG/3ML) 0.083% nebulizer solution Take 3 mLs (2.5 mg total) by nebulization every 6 (six) hours as needed for wheezing or shortness of breath. 75 mL 12  . baclofen (LIORESAL) 10 MG tablet Take 1 tablet (10 mg total) by mouth 3 (three) times daily. 30 each 0  . buPROPion (WELLBUTRIN XL) 150 MG 24 hr tablet Take 1 tablet (150 mg total) by mouth 2 (two) times daily. Start 150 mg ER PO qam, increase after 3 days to 150 mg ER PO BID. 60 tablet 3  . canagliflozin (INVOKANA) 300 MG TABS tablet Take 1 tablet (300 mg total) by mouth daily. 90 tablet 3  . cetirizine (ZYRTEC) 10 MG tablet Take 1 tablet (10 mg total) by mouth daily. 30 tablet 5  . clindamycin (  CLEOCIN T) 1 % external solution     . EPIPEN 2-PAK 0.3 MG/0.3ML SOAJ injection   11  . estradiol (ESTRACE) 0.1 MG/GM vaginal cream 0.5 g intravaginally 1-3 times per week. 42.5 g 0  . fexofenadine (ALLEGRA) 180 MG tablet Take 180 mg by mouth daily.  11  . fluticasone (FLONASE) 50 MCG/ACT nasal spray Place 2 sprays into both nostrils daily. 16 g 1  . fluticasone (VERAMYST) 27.5 MCG/SPRAY nasal spray Place 2 sprays into the nose daily. 10 g 12  . fluticasone furoate-vilanterol (BREO ELLIPTA) 200-25 MCG/INH AEPB Inhale 1 puff into the lungs daily. 60 each 5  . levocetirizine (XYZAL) 5 MG tablet Take 1 tablet (5 mg total) by mouth every evening. 30 tablet 1  . levothyroxine (SYNTHROID, LEVOTHROID) 75 MCG tablet Take 1 tablet (75 mcg total) by mouth daily. 90 tablet 3  . Mepolizumab (NUCALA) 100 MG SOLR Inject 100 mg into the skin every 30 (thirty) days. 1 each 5  . metFORMIN (GLUCOPHAGE XR) 500 MG 24 hr tablet  Start 500mg  PO qpm. 90 tablet 3  . methylPREDNISolone (MEDROL DOSEPAK) 4 MG TBPK tablet Take 6 pills on day one then decrease by 1 pill each day 21 tablet 0  . montelukast (SINGULAIR) 10 MG tablet TAKE 1 TABLET (10 MG TOTAL) BY MOUTH AT BEDTIME. 30 tablet 5  . naproxen (NAPROSYN) 500 MG tablet Take 1 tablet (500 mg total) by mouth 2 (two) times daily with a meal. 30 tablet 0  . pantoprazole (PROTONIX) 40 MG tablet TAKE 1 TABLET BY MOUTH DAILY. 30 tablet 5  . predniSONE (DELTASONE) 10 MG tablet Take 1 tablet (10 mg total) by mouth daily with breakfast. 10 tablet 0  . rosuvastatin (CRESTOR) 40 MG tablet Take 1 tablet (40 mg total) by mouth daily. 90 tablet 3  . sertraline (ZOLOFT) 100 MG tablet Take 1 tablet (100 mg total) by mouth at bedtime. 90 tablet 1   Current Facility-Administered Medications on File Prior to Visit  Medication Dose Route Frequency Provider Last Rate Last Dose  . dexamethasone (DECADRON) injection 10 mg  10 mg Intramuscular Once Versie Starks, PA-C      . omalizumab Arvid Right) injection 300 mg  300 mg Subcutaneous Q14 Days Flora Lipps, MD   300 mg at 06/15/16 1209    Social History  Substance Use Topics  . Smoking status: Never Smoker  . Smokeless tobacco: Never Used  . Alcohol use 0.0 oz/week    Review of Systems  Constitutional: Positive for fever. Negative for chills.  HENT: Positive for sore throat. Negative for ear pain, sinus pain and sinus pressure.   Respiratory: Negative for cough.   Cardiovascular: Negative for chest pain and palpitations.  Gastrointestinal: Negative for nausea and vomiting.  Musculoskeletal: Negative for arthralgias and myalgias.  Skin: Positive for rash.      Objective:    BP 125/60   Pulse 84   Temp 98.2 F (36.8 C) (Oral)   Ht 5\' 2"  (1.575 m)   Wt 158 lb (71.7 kg)   SpO2 98%   BMI 28.90 kg/m    Physical Exam  Constitutional: She appears well-developed and well-nourished.  HENT:  Mouth/Throat: Mucous membranes are  normal. No oral lesions. No oropharyngeal exudate, posterior oropharyngeal edema, posterior oropharyngeal erythema or tonsillar abscesses.  Eyes: Conjunctivae are normal.  Cardiovascular: Normal rate, regular rhythm, normal heart sounds and normal pulses.   Pulmonary/Chest: Effort normal and breath sounds normal. She has no wheezes. She has no  rhonchi. She has no rales.  Neurological: She is alert.  Skin: Skin is warm and dry. Rash noted. No petechiae noted. Rash is maculopapular. Rash is not nodular and not vesicular.  Diffuse maculopapular lesions noted bilateral arms upper back, chest, legs, groin. No lesions noted on face, pharynx, ventral aspect of hands or feet. NO burrows, linear formation.  No drainage, streaking.     Psychiatric: She has a normal mood and affect. Her speech is normal and behavior is normal. Thought content normal.  Vitals reviewed.      Assessment & Plan:   Problem List Items Addressed This Visit      Cardiovascular and Mediastinum   HTN (hypertension)    Controlled. Low suspicion that ACE could be causing rash however it is a new medication for patient. Trial stop. Start amlodipine, follow up 2 weeks.       Relevant Medications   amLODipine (NORVASC) 2.5 MG tablet   Other Relevant Orders   CBC with Differential/Platelet     Musculoskeletal and Integument   Osteopenia    DEXA shows osteopenia and reviewed with patient. .based on frax score, patient does not meet criteria for medication therapy at this time. Education provided regarding appropriate calcium, vitamin D and exercise. will follow.       Rash - Primary    Afebrile and well appearing. Presentation does not support scabies. Etiology for rash nonspecific at this time.  Concern for varicella. Education provided on severity for adults and patient and I agreed to start anti viral with close vigilance. Pending labs.       Relevant Medications   acyclovir (ZOVIRAX) 800 MG tablet   Other Relevant  Orders   Comprehensive metabolic panel   Varicella-zoster by PCR   Varicella zoster antibody, IgG   Varicella zoster antibody, IgM   Grp A Strep   Rocky mtn spotted fvr abs pnl(IgG+IgM)   Lyme Ab/Western Blot Reflex        I have discontinued Bridget Peters's lisinopril. I am also having her start on acyclovir and amLODipine. Additionally, I am having her maintain her fluticasone, albuterol, albuterol, levothyroxine, canagliflozin, metFORMIN, rosuvastatin, levocetirizine, fluticasone, cetirizine, baclofen, naproxen, Mepolizumab, fluticasone furoate-vilanterol, sertraline, fexofenadine, pantoprazole, montelukast, buPROPion, estradiol, clindamycin, EPIPEN 2-PAK, methylPREDNISolone, and predniSONE. We will continue to administer omalizumab and dexamethasone.   Meds ordered this encounter  Medications  . acyclovir (ZOVIRAX) 800 MG tablet    Sig: Take 1 tablet (800 mg total) by mouth 4 (four) times daily.    Dispense:  28 tablet    Refill:  0    Order Specific Question:   Supervising Provider    Answer:   Deborra Medina L [[id number]]  . amLODipine (NORVASC) 2.5 MG tablet    Sig: Take 1 tablet (2.5 mg total) by mouth daily.    Dispense:  90 tablet    Refill:  3    Order Specific Question:   Supervising Provider    Answer:   Crecencio Mc [[id number]]    Return precautions given.   Risks, benefits, and alternatives of the medications and treatment plan prescribed today were discussed, and patient expressed understanding.   Education regarding symptom management and diagnosis given to patient on AVS.  Continue to follow with Burnard Hawthorne, FNP for routine health maintenance.   Maryjane E Mino and I agreed with plan.   Mable Paris, FNP

## 2017-05-14 NOTE — Progress Notes (Signed)
Patient has been informed. She coming back to lab now.

## 2017-05-14 NOTE — Assessment & Plan Note (Addendum)
Afebrile and well appearing. Presentation does not support scabies. Etiology for rash nonspecific at this time.  Concern for varicella. Education provided on severity for adults and patient and I agreed to start anti viral with close vigilance. Pending labs.

## 2017-05-14 NOTE — Assessment & Plan Note (Addendum)
DEXA shows osteopenia and reviewed with patient. .based on frax score, patient does not meet criteria for medication therapy at this time. Education provided regarding appropriate calcium, vitamin D and exercise. will follow.

## 2017-05-14 NOTE — Patient Instructions (Addendum)
Stay very vigilant for any new symptoms  Suspect chicken pox. Will trial anti viral.   You are contagious until all your blisters have scabbed.  Your dexa scan shows osteopenia. Your fracture risk for the next 10 years is 1.6% for your hip and 7.3% for major osteoporotic fracture.   We need to follow this and monitor again in 2 more years to see if improved or worsened.  For post menopausal women, guidelines recommend a diet with 1200 mg of Calcium per day. If you are eating calcium rich foods, you do not need a calcium supplement. The body better absorbs the calcium that you eat over supplementation. If you do supplement, I recommend not supplementing the full 1200 mg/ day as this can lead to increased risk of cardiovascular disease. I recommend Calcium Citrate over the counter, and you may take a total of 600 to 800 mg per day in divided doses with meals for best absorption.   For bone health, you need adequate vitamin D, and I recommend you supplement as it is harder to do so with diet alone. I recommend cholecalciferol 800 units daily.  Also, please ensure you are following a diet high in calcium -- research shows better outcomes with dietary sources including kale, yogurt, broccolii, cheese, okra, almonds- to name a few.     Also remember that exercise is a great medicine for maintain and preserve bone health. Advise moderate exercise for 30 minutes , 3 times per week.

## 2017-05-14 NOTE — Addendum Note (Signed)
Addended by: Elpidio Galea T on: 05/14/2017 03:20 PM   Modules accepted: Orders

## 2017-05-14 NOTE — Addendum Note (Signed)
Addended by: Burnard Hawthorne on: 05/14/2017 01:53 PM   Modules accepted: Orders

## 2017-05-14 NOTE — Assessment & Plan Note (Signed)
Controlled. Low suspicion that ACE could be causing rash however it is a new medication for patient. Trial stop. Start amlodipine, follow up 2 weeks.

## 2017-05-15 ENCOUNTER — Ambulatory Visit: Payer: 59

## 2017-05-15 LAB — LYME AB/WESTERN BLOT REFLEX

## 2017-05-15 LAB — COMPREHENSIVE METABOLIC PANEL
ALT: 22 U/L (ref 6–29)
AST: 16 U/L (ref 10–35)
Albumin: 4.8 g/dL (ref 3.6–5.1)
Alkaline Phosphatase: 68 U/L (ref 33–115)
BILIRUBIN TOTAL: 0.4 mg/dL (ref 0.2–1.2)
BUN: 17 mg/dL (ref 7–25)
CO2: 21 mmol/L (ref 20–32)
Calcium: 9.7 mg/dL (ref 8.6–10.2)
Chloride: 100 mmol/L (ref 98–110)
Creat: 0.9 mg/dL (ref 0.50–1.10)
GLUCOSE: 185 mg/dL — AB (ref 65–99)
Potassium: 4.4 mmol/L (ref 3.5–5.3)
Sodium: 135 mmol/L (ref 135–146)
Total Protein: 7.2 g/dL (ref 6.1–8.1)

## 2017-05-15 LAB — ROCKY MTN SPOTTED FVR ABS PNL(IGG+IGM)
RMSF IGG: NOT DETECTED
RMSF IgM: NOT DETECTED

## 2017-05-15 LAB — VARICELLA ZOSTER ANTIBODY, IGG: Varicella IgG: <135 Index (ref <135.00)

## 2017-05-15 NOTE — Telephone Encounter (Signed)
I got today's message. I am sorry to hear that. Of course, reschedule when they dry up and the itching is gone. Bless your heart,that's terrible.

## 2017-05-16 ENCOUNTER — Encounter: Payer: Self-pay | Admitting: Family

## 2017-05-16 LAB — VARICELLA-ZOSTER BY PCR: VZV DNA, QL PCR: NOT DETECTED

## 2017-05-16 LAB — VARICELLA ZOSTER ANTIBODY, IGM: Varicella Zoster Ab IgM: <=0.9

## 2017-05-17 ENCOUNTER — Ambulatory Visit: Payer: Self-pay | Admitting: Physician Assistant

## 2017-05-17 ENCOUNTER — Encounter: Payer: Self-pay | Admitting: Family

## 2017-05-17 NOTE — Telephone Encounter (Signed)
Left voice mail to call back 

## 2017-05-18 NOTE — Telephone Encounter (Signed)
Left voice mail to call back 

## 2017-05-18 NOTE — Telephone Encounter (Signed)
Attempted to call patient. Left message to return call to office.

## 2017-05-21 ENCOUNTER — Telehealth: Payer: 59 | Admitting: Family

## 2017-05-21 DIAGNOSIS — B9789 Other viral agents as the cause of diseases classified elsewhere: Secondary | ICD-10-CM | POA: Diagnosis not present

## 2017-05-21 DIAGNOSIS — J019 Acute sinusitis, unspecified: Secondary | ICD-10-CM

## 2017-05-21 MED ORDER — FLUTICASONE PROPIONATE 50 MCG/ACT NA SUSP: 2.0000 | Freq: Every day | NASAL | 1 refills | Status: DC

## 2017-05-21 NOTE — Telephone Encounter (Signed)
Left voice mail to call back 

## 2017-05-21 NOTE — Telephone Encounter (Signed)
courtesy call to patient to see how she's doing since she never called back   I see she had an e visit today for sinusitis. Does She still have rash?

## 2017-05-21 NOTE — Progress Notes (Signed)

## 2017-05-22 NOTE — Telephone Encounter (Signed)
Left voice mail to call back 

## 2017-05-25 ENCOUNTER — Telehealth: Payer: 59 | Admitting: Family

## 2017-05-25 DIAGNOSIS — J028 Acute pharyngitis due to other specified organisms: Secondary | ICD-10-CM

## 2017-05-25 DIAGNOSIS — B9689 Other specified bacterial agents as the cause of diseases classified elsewhere: Secondary | ICD-10-CM | POA: Diagnosis not present

## 2017-05-25 DIAGNOSIS — G4733 Obstructive sleep apnea (adult) (pediatric): Secondary | ICD-10-CM | POA: Diagnosis not present

## 2017-05-25 MED ORDER — AZITHROMYCIN 250 MG PO TABS: ORAL_TABLET | ORAL | 0 refills | Status: DC

## 2017-05-25 MED ORDER — BENZONATATE 100 MG PO CAPS: 100.0000 mg | ORAL_CAPSULE | Freq: Three times a day (TID) | ORAL | 0 refills | Status: DC | PRN

## 2017-05-25 NOTE — Progress Notes (Signed)
Thank you for the details you put in the comment boxes. Those details really help us take better care of you.   We are sorry that you are not feeling well.  Here is how we plan to help!  Based on your presentation I believe you most likely have A cough due to bacteria.  When patients have a fever and a productive cough with a change in color or increased sputum production, we are concerned about bacterial bronchitis.  If left untreated it can progress to pneumonia.  If your symptoms do not improve with your treatment plan it is important that you contact your provider.   I have prescribed Azithromyin 250 mg: two tables now and then one tablet daily for 4 additonal days    In addition you may use A non-prescription cough medication called Mucinex DM: take 2 tablets every 12 hours. and A prescription cough medication called Tessalon Perles 100mg. You may take 1-2 capsules every 8 hours as needed for your cough.   From your responses in the eVisit questionnaire you describe inflammation in the upper respiratory tract which is causing a significant cough.  This is commonly called Bronchitis and has four common causes:    Allergies  Viral Infections  Acid Reflux  Bacterial Infection Allergies, viruses and acid reflux are treated by controlling symptoms or eliminating the cause. An example might be a cough caused by taking certain blood pressure medications. You stop the cough by changing the medication. Another example might be a cough caused by acid reflux. Controlling the reflux helps control the cough.  USE OF BRONCHODILATOR ("RESCUE") INHALERS: There is a risk from using your bronchodilator too frequently.  The risk is that over-reliance on a medication which only relaxes the muscles surrounding the breathing tubes can reduce the effectiveness of medications prescribed to reduce swelling and congestion of the tubes themselves.  Although you feel brief relief from the bronchodilator inhaler, your  asthma may actually be worsening with the tubes becoming more swollen and filled with mucus.  This can delay other crucial treatments, such as oral steroid medications. If you need to use a bronchodilator inhaler daily, several times per day, you should discuss this with your provider.  There are probably better treatments that could be used to keep your asthma under control.     HOME CARE . Only take medications as instructed by your medical team. . Complete the entire course of an antibiotic. . Drink plenty of fluids and get plenty of rest. . Avoid close contacts especially the very young and the elderly . Cover your mouth if you cough or cough into your sleeve. . Always remember to wash your hands . A steam or ultrasonic humidifier can help congestion.   GET HELP RIGHT AWAY IF: . You develop worsening fever. . You become short of breath . You cough up blood. . Your symptoms persist after you have completed your treatment plan MAKE SURE YOU   Understand these instructions.  Will watch your condition.  Will get help right away if you are not doing well or get worse.  Your e-visit answers were reviewed by a board certified advanced clinical practitioner to complete your personal care plan.  Depending on the condition, your plan could have included both over the counter or prescription medications. If there is a problem please reply  once you have received a response from your provider. Your safety is important to us.  If you have drug allergies check your prescription carefully.      You can use MyChart to ask questions about today's visit, request a non-urgent call back, or ask for a work or school excuse for 24 hours related to this e-Visit. If it has been greater than 24 hours you will need to follow up with your provider, or enter a new e-Visit to address those concerns. You will get an e-mail in the next two days asking about your experience.  I hope that your e-visit has been  valuable and will speed your recovery. Thank you for using e-visits.   

## 2017-05-29 ENCOUNTER — Encounter: Payer: Self-pay | Admitting: Family

## 2017-05-29 ENCOUNTER — Ambulatory Visit (INDEPENDENT_AMBULATORY_CARE_PROVIDER_SITE_OTHER): Payer: 59 | Admitting: Family

## 2017-05-29 VITALS — BP 126/62 | HR 84 | Temp 98.6°F | Wt 157.4 lb

## 2017-05-29 DIAGNOSIS — J40 Bronchitis, not specified as acute or chronic: Secondary | ICD-10-CM

## 2017-05-29 MED ORDER — DOXYCYCLINE HYCLATE 100 MG PO TABS: 100.0000 mg | ORAL_TABLET | Freq: Two times a day (BID) | ORAL | 0 refills | Status: DC

## 2017-05-29 MED ORDER — HYDROCODONE-HOMATROPINE 5-1.5 MG/5ML PO SYRP: 5.0000 mL | ORAL_SOLUTION | Freq: Every evening | ORAL | 0 refills | Status: DC | PRN

## 2017-05-29 NOTE — Progress Notes (Signed)
Subjective:    Patient ID: Bridget Peters, female    DOB: 05-04-1971, 46 y.o.   MRN: [id number]  CC: [name] is a 46 y.o. female who presents today for an acute visit.    HPI: CC: productive cough morning and night, 8 days, waxing and waning. Started out as sinus pressure, nasal congestion and now more in chest.   With activity has SOB and cough. Endorses chills and tactile fever, on and off.   Wheezing when 'lays down.'   Denies exertional chest pain or pressure, numbness or tingling radiating to left arm or jaw, palpitations, dizziness, frequent headaches, changes in vision.  E visit and given z pak 3 days ago. Completed today and not better  Reviewed last note from dr Mortimer Fries 04/2017; awaiting immunotherapy- prescribed nucala [place]  After reaction to zolair however hasnt started due to all going on. Plans to start,         HISTORY:  Past Medical History:  Diagnosis Date  . Allergy    Seasonal  . Asthma   . Depression   . Diabetes mellitus without complication ([place])    Pt takes Metformin.  Marland Kitchen Hypertension   . Pneumonia   . Shortness of breath   . Sleep apnea   . Thyroid disease    Past Surgical History:  Procedure Laterality Date  . BREAST SURGERY  01/2013   Breast Reduction   . NASAL SINUS SURGERY    . NASAL SINUS SURGERY  02/2014  . REDUCTION MAMMAPLASTY Bilateral 2014   Family History  Problem Relation Age of Onset  . Hypertension Mother   . Hypertension Father   . Colon polyps Father   . Cancer Maternal Aunt        Breast Cancer  . Breast cancer Maternal Aunt   . Breast cancer Paternal Aunt        Breast Cancer  . Cancer Maternal Grandmother 72       Bladder Cancer  . Stroke Maternal Grandmother   . Breast cancer Maternal Grandmother 60  . Cancer Maternal Grandfather 61       Esophagus Cancer  . Colon cancer Neg Hx   . Ovarian cancer Neg Hx     Allergies: Milk-related compounds; Shellfish allergy; Wheat bran; Xolair [omalizumab]; and  Isovue [iopamidol] Current Outpatient Prescriptions on File Prior to Visit  Medication Sig Dispense Refill  . albuterol (PROVENTIL HFA;VENTOLIN HFA) 108 (90 Base) MCG/ACT inhaler Inhale 2 puffs into the lungs every 4 (four) hours as needed for wheezing or shortness of breath. 1 Inhaler 6  . albuterol (PROVENTIL) (2.5 MG/3ML) 0.083% nebulizer solution Take 3 mLs (2.5 mg total) by nebulization every 6 (six) hours as needed for wheezing or shortness of breath. 75 mL 12  . amLODipine (NORVASC) 2.5 MG tablet Take 1 tablet (2.5 mg total) by mouth daily. 90 tablet 3  . buPROPion (WELLBUTRIN XL) 150 MG 24 hr tablet Take 1 tablet (150 mg total) by mouth 2 (two) times daily. Start 150 mg ER PO qam, increase after 3 days to 150 mg ER PO BID. 60 tablet 3  . canagliflozin (INVOKANA) 300 MG TABS tablet Take 1 tablet (300 mg total) by mouth daily. 90 tablet 3  . cetirizine (ZYRTEC) 10 MG tablet Take 1 tablet (10 mg total) by mouth daily. 30 tablet 5  . clindamycin (CLEOCIN T) 1 % external solution     . EPIPEN 2-PAK 0.3 MG/0.3ML SOAJ injection   11  . estradiol (ESTRACE)  0.1 MG/GM vaginal cream 0.5 g intravaginally 1-3 times per week. 42.5 g 0  . fexofenadine (ALLEGRA) 180 MG tablet Take 180 mg by mouth daily.  11  . fluticasone (FLONASE) 50 MCG/ACT nasal spray Place 2 sprays into both nostrils daily. 16 g 1  . fluticasone (VERAMYST) 27.5 MCG/SPRAY nasal spray Place 2 sprays into the nose daily. 10 g 12  . fluticasone furoate-vilanterol (BREO ELLIPTA) 200-25 MCG/INH AEPB Inhale 1 puff into the lungs daily. 60 each 5  . levocetirizine (XYZAL) 5 MG tablet Take 1 tablet (5 mg total) by mouth every evening. 30 tablet 1  . levothyroxine (SYNTHROID, LEVOTHROID) 75 MCG tablet Take 1 tablet (75 mcg total) by mouth daily. 90 tablet 3  . Mepolizumab (NUCALA) 100 MG SOLR Inject 100 mg into the skin every 30 (thirty) days. 1 each 5  . metFORMIN (GLUCOPHAGE XR) 500 MG 24 hr tablet Start 500mg  PO qpm. 90 tablet 3  .  methylPREDNISolone (MEDROL DOSEPAK) 4 MG TBPK tablet Take 6 pills on day one then decrease by 1 pill each day 21 tablet 0  . montelukast (SINGULAIR) 10 MG tablet TAKE 1 TABLET (10 MG TOTAL) BY MOUTH AT BEDTIME. 30 tablet 5  . naproxen (NAPROSYN) 500 MG tablet Take 1 tablet (500 mg total) by mouth 2 (two) times daily with a meal. 30 tablet 0  . pantoprazole (PROTONIX) 40 MG tablet TAKE 1 TABLET BY MOUTH DAILY. 30 tablet 5  . predniSONE (DELTASONE) 10 MG tablet Take 1 tablet (10 mg total) by mouth daily with breakfast. 10 tablet 0  . rosuvastatin (CRESTOR) 40 MG tablet Take 1 tablet (40 mg total) by mouth daily. 90 tablet 3  . sertraline (ZOLOFT) 100 MG tablet Take 1 tablet (100 mg total) by mouth at bedtime. 90 tablet 1   Current Facility-Administered Medications on File Prior to Visit  Medication Dose Route Frequency Provider Last Rate Last Dose  . dexamethasone (DECADRON) injection 10 mg  10 mg Intramuscular Once Versie Starks, PA-C      . omalizumab Arvid Right) injection 300 mg  300 mg Subcutaneous Q14 Days Flora Lipps, MD   300 mg at 06/15/16 1209    Social History  Substance Use Topics  . Smoking status: Never Smoker  . Smokeless tobacco: Never Used  . Alcohol use 0.0 oz/week    Review of Systems  Constitutional: Negative for chills and fever.  HENT: Positive for congestion, sinus pain and sinus pressure. Negative for ear pain and sore throat.   Eyes: Negative for visual disturbance.  Respiratory: Positive for cough, shortness of breath and wheezing.   Cardiovascular: Negative for chest pain and palpitations.  Gastrointestinal: Negative for nausea and vomiting.  Neurological: Negative for headaches.      Objective:    BP 126/62 (BP Location: Left Arm, Patient Position: Sitting, Cuff Size: Normal)   Pulse 84   Temp 98.6 F (37 C) (Oral)   Wt 157 lb 6.4 oz (71.4 kg)   SpO2 98%   BMI 28.79 kg/m    Physical Exam  Constitutional: She appears well-developed and  well-nourished.  HENT:  Head: Normocephalic and atraumatic.  Right Ear: Hearing, tympanic membrane, external ear and ear canal normal. No drainage, swelling or tenderness. No foreign bodies. Tympanic membrane is not erythematous and not bulging. No middle ear effusion. No decreased hearing is noted.  Left Ear: Hearing, tympanic membrane, external ear and ear canal normal. No drainage, swelling or tenderness. No foreign bodies. Tympanic membrane is not erythematous and  not bulging.  No middle ear effusion. No decreased hearing is noted.  Nose: No rhinorrhea. Right sinus exhibits maxillary sinus tenderness. Right sinus exhibits no frontal sinus tenderness. Left sinus exhibits no maxillary sinus tenderness and no frontal sinus tenderness.  Mouth/Throat: Uvula is midline, oropharynx is clear and moist and mucous membranes are normal. No oropharyngeal exudate, posterior oropharyngeal edema, posterior oropharyngeal erythema or tonsillar abscesses.  Eyes: Conjunctivae are normal.  Cardiovascular: Regular rhythm, normal heart sounds and normal pulses.   Pulmonary/Chest: Effort normal and breath sounds normal. She has no wheezes. She has no rhonchi. She has no rales.  Lymphadenopathy:       Head (right side): No submental, no submandibular, no tonsillar, no preauricular, no posterior auricular and no occipital adenopathy present.       Head (left side): No submental, no submandibular, no tonsillar, no preauricular, no posterior auricular and no occipital adenopathy present.    She has no cervical adenopathy.  Neurological: She is alert.  Skin: Skin is warm and dry.  Psychiatric: She has a normal mood and affect. Her speech is normal and behavior is normal. Thought content normal.  Vitals reviewed.      Assessment & Plan:   1. Bronchitis Afebrile. SPO2 98%. No acute respiratory distress or adventitious lung sounds. Based on duration of symptoms and patient's complex history of allergic rhinitis,  asthma, jointly agreed to go ahead and start doxycycline today. Due to back-to-back antibiotics, strongly encouraged probiotics. Return precautions given. If no improvement in symptoms, discussed prednisone taper with patient.   - doxycycline (VIBRA-TABS) 100 MG tablet; Take 1 tablet (100 mg total) by mouth 2 (two) times daily.  Dispense: 14 tablet; Refill: 0 - HYDROcodone-homatropine (HYCODAN) 5-1.5 MG/5ML syrup; Take 5 mLs by mouth at bedtime as needed for cough.  Dispense: 30 mL; Refill: 0    I have discontinued Ms. Mun's baclofen, azithromycin, and benzonatate. I am also having her start on doxycycline and HYDROcodone-homatropine. Additionally, I am having her maintain her fluticasone, albuterol, albuterol, levothyroxine, canagliflozin, metFORMIN, rosuvastatin, levocetirizine, cetirizine, naproxen, Mepolizumab, fluticasone furoate-vilanterol, sertraline, fexofenadine, pantoprazole, montelukast, buPROPion, estradiol, clindamycin, EPIPEN 2-PAK, methylPREDNISolone, predniSONE, amLODipine, and fluticasone. We will continue to administer omalizumab and dexamethasone.   Meds ordered this encounter  Medications  . doxycycline (VIBRA-TABS) 100 MG tablet    Sig: Take 1 tablet (100 mg total) by mouth 2 (two) times daily.    Dispense:  14 tablet    Refill:  0    Order Specific Question:   Supervising Provider    Answer:   Deborra Medina L [[id number]]  . HYDROcodone-homatropine (HYCODAN) 5-1.5 MG/5ML syrup    Sig: Take 5 mLs by mouth at bedtime as needed for cough.    Dispense:  30 mL    Refill:  0    Order Specific Question:   Supervising Provider    Answer:   Crecencio Mc [[id number]]    Return precautions given.   Risks, benefits, and alternatives of the medications and treatment plan prescribed today were discussed, and patient expressed understanding.   Education regarding symptom management and diagnosis given to patient on AVS.  Continue to follow with Burnard Hawthorne, FNP for routine  health maintenance.   Kyandra E Godinho and I agreed with plan.   Mable Paris, FNP

## 2017-05-29 NOTE — Patient Instructions (Signed)
Start doxycycline  Ensure to take probiotics while on antibiotics and also for 2 weeks after completion. It is important to re-colonize the gut with good bacteria and also to prevent any diarrheal infections associated with antibiotic use.   Please take cough medication at night only as needed. As we discussed, I do not recommend dosing throughout the day as coughing is a protective mechanism . It also helps to break up thick mucous.  Do not take cough suppressants with alcohol as can lead to trouble breathing. Advise caution if taking cough suppressant and operating machinery ( i.e driving a car) as you may feel very tired.   If not better, please let me know- may need prednisone taper as well.   If there is no improvement in your symptoms, or if there is any worsening of symptoms, or if you have any additional concerns, please return for re-evaluation; or, if we are closed, consider going to the Emergency Room for evaluation if symptoms urgent.

## 2017-06-21 ENCOUNTER — Other Ambulatory Visit: Payer: Self-pay | Admitting: *Deleted

## 2017-06-21 DIAGNOSIS — D582 Other hemoglobinopathies: Secondary | ICD-10-CM

## 2017-06-22 ENCOUNTER — Inpatient Hospital Stay: Payer: 59 | Attending: Hematology and Oncology

## 2017-06-22 ENCOUNTER — Inpatient Hospital Stay: Payer: 59 | Admitting: Hematology and Oncology

## 2017-06-22 NOTE — Progress Notes (Deleted)
[facility] day:  06/22/2017  Chief Complaint: Bridget Peters is a 46 y.o. female with an elevated hemoglobin who is seen for 3 month assessment.  HPI: The patient was last seen in the hematology clinic on 03/19/2017.   At that time, she had a poor appetite and abdominal discomfort. She had undergone EGD and colonoscopy by Dr. Donna Christen on 03/13/2017 (no results are available).  CBC, CMP, and LDH were normal.  We discussed consideration of chest CT to r/o adenopathy if concern for weight loss and B symptoms (abdomen and pelvic CT and CXR were negative).  She was seen by Dr Mortimer Fries in 04/2017.  Varicella, RMSF, and Lyme titers were negative.  CBC on 05/14/2017 revealed a hematocrit of 40.8, hemoglobin 14, platelets 321,000, WBC 9700 with an ANC of 8439.  She was seen by Dr. Rhunette Croft on 05/02/2017 at the [facility].  She was using her CPAP appropriately.  She was felt to benefit from ongoing use.  During the interim,    Past Medical History:  Diagnosis Date  . Allergy    Seasonal  . Asthma   . Depression   . Diabetes mellitus without complication ([place])    Pt takes Metformin.  Marland Kitchen Hypertension   . Pneumonia   . Shortness of breath   . Sleep apnea   . Thyroid disease     Past Surgical History:  Procedure Laterality Date  . BREAST SURGERY  01/2013   Breast Reduction   . NASAL SINUS SURGERY    . NASAL SINUS SURGERY  02/2014  . REDUCTION MAMMAPLASTY Bilateral 2014    Family History  Problem Relation Age of Onset  . Hypertension Mother   . Hypertension Father   . Colon polyps Father   . Cancer Maternal Aunt        Breast Cancer  . Breast cancer Maternal Aunt   . Breast cancer Paternal Aunt        Breast Cancer  . Cancer Maternal Grandmother 66       Bladder Cancer  . Stroke Maternal Grandmother   . Breast cancer Maternal Grandmother 60  . Cancer Maternal Grandfather 25       Esophagus Cancer  . Colon cancer Neg Hx   .  Ovarian cancer Neg Hx     Social History:  reports that she has never smoked. She has never used smokeless tobacco. She reports that she drinks alcohol. She reports that she does not use drugs.  She denies any exposure to radiation or toxins.  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  She lives in [place].  The patient is alone today.  Allergies:  Allergies  Allergen Reactions  . Milk-Related Compounds Hives  . Shellfish Allergy Hives    Shortness of breath  . Wheat Bran Hives  . Xolair [Omalizumab]     Itching  . Isovue [Iopamidol] Hives    Pt developed hives approximately 6 hours after receiving Isovue 300 for CT Scan. She was given 47m oral Benadryl and symptoms improved.     Current Medications: Current Outpatient Prescriptions  Medication Sig Dispense Refill  . albuterol (PROVENTIL HFA;VENTOLIN HFA) 108 (90 Base) MCG/ACT inhaler Inhale 2 puffs into the lungs every 4 (four) hours as needed for wheezing or shortness of breath. 1 Inhaler 6  . albuterol (PROVENTIL) (2.5 MG/3ML) 0.083% nebulizer solution Take 3 mLs (2.5 mg total) by nebulization every 6 (six) hours as needed  for wheezing or shortness of breath. 75 mL 12  . amLODipine (NORVASC) 2.5 MG tablet Take 1 tablet (2.5 mg total) by mouth daily. 90 tablet 3  . buPROPion (WELLBUTRIN XL) 150 MG 24 hr tablet Take 1 tablet (150 mg total) by mouth 2 (two) times daily. Start 150 mg ER PO qam, increase after 3 days to 150 mg ER PO BID. 60 tablet 3  . canagliflozin (INVOKANA) 300 MG TABS tablet Take 1 tablet (300 mg total) by mouth daily. 90 tablet 3  . cetirizine (ZYRTEC) 10 MG tablet Take 1 tablet (10 mg total) by mouth daily. 30 tablet 5  . clindamycin (CLEOCIN T) 1 % external solution     . doxycycline (VIBRA-TABS) 100 MG tablet Take 1 tablet (100 mg total) by mouth 2 (two) times daily. 14 tablet 0  . EPIPEN 2-PAK 0.3 MG/0.3ML SOAJ injection   11  . estradiol (ESTRACE) 0.1 MG/GM vaginal cream 0.5 g  intravaginally 1-3 times per week. 42.5 g 0  . fexofenadine (ALLEGRA) 180 MG tablet Take 180 mg by mouth daily.  11  . fluticasone (FLONASE) 50 MCG/ACT nasal spray Place 2 sprays into both nostrils daily. 16 g 1  . fluticasone (VERAMYST) 27.5 MCG/SPRAY nasal spray Place 2 sprays into the nose daily. 10 g 12  . fluticasone furoate-vilanterol (BREO ELLIPTA) 200-25 MCG/INH AEPB Inhale 1 puff into the lungs daily. 60 each 5  . HYDROcodone-homatropine (HYCODAN) 5-1.5 MG/5ML syrup Take 5 mLs by mouth at bedtime as needed for cough. 30 mL 0  . levocetirizine (XYZAL) 5 MG tablet Take 1 tablet (5 mg total) by mouth every evening. 30 tablet 1  . levothyroxine (SYNTHROID, LEVOTHROID) 75 MCG tablet Take 1 tablet (75 mcg total) by mouth daily. 90 tablet 3  . Mepolizumab (NUCALA) 100 MG SOLR Inject 100 mg into the skin every 30 (thirty) days. 1 each 5  . metFORMIN (GLUCOPHAGE XR) 500 MG 24 hr tablet Start 566m PO qpm. 90 tablet 3  . methylPREDNISolone (MEDROL DOSEPAK) 4 MG TBPK tablet Take 6 pills on day one then decrease by 1 pill each day 21 tablet 0  . montelukast (SINGULAIR) 10 MG tablet TAKE 1 TABLET (10 MG TOTAL) BY MOUTH AT BEDTIME. 30 tablet 5  . naproxen (NAPROSYN) 500 MG tablet Take 1 tablet (500 mg total) by mouth 2 (two) times daily with a meal. 30 tablet 0  . pantoprazole (PROTONIX) 40 MG tablet TAKE 1 TABLET BY MOUTH DAILY. 30 tablet 5  . predniSONE (DELTASONE) 10 MG tablet Take 1 tablet (10 mg total) by mouth daily with breakfast. 10 tablet 0  . rosuvastatin (CRESTOR) 40 MG tablet Take 1 tablet (40 mg total) by mouth daily. 90 tablet 3  . sertraline (ZOLOFT) 100 MG tablet Take 1 tablet (100 mg total) by mouth at bedtime. 90 tablet 1   Current Facility-Administered Medications  Medication Dose Route Frequency Provider Last Rate Last Dose  . dexamethasone (DECADRON) injection 10 mg  10 mg Intramuscular Once FVersie Starks PA-C      . omalizumab (Arvid Right injection 300 mg  300 mg Subcutaneous  Q14 Days KFlora Lipps MD   300 mg at 06/15/16 1209    Review of Systems:  GENERAL:  Fatigue.  Exhausted all week.  No fevers.  Drenching night sweats x 2-3 months.  Weight up 4 pounds since last visit. PERFORMANCE STATUS (ECOG):  1 HEENT: Allergies.  No visual changes, sore throat, mouth sores or tenderness. Lungs: No shortness of breath or  cough.  No hemoptysis. Cardiac:  No chest pain, palpitations, orthopnea, or PND. GI:  Upper abdominal pain.  Constipation.  No nausea, vomiting, diarrhea, melena or hematochezia. Guaiac + stool (1 of 2).  EGD and colonoscopy 03/13/2017. GU:  No urgency, frequency, dysuria, or hematuria. Musculoskeletal:  No back pain.  No joint pain.  No muscle tenderness. Extremities:  No pain or swelling. Skin:  No rashes or skin changes.  After bath itching x 2 weeks. Neuro:  No headache, numbness or weakness, balance or coordination issues. Endocrine:  No diabetes.  Thyroid disease on Synthroid.  No hot flashes.  Menopause at age 47. Psych:  No mood changes, depression or anxiety. Pain:  No focal pain. Review of systems:  All other systems reviewed and found to be negative.  Physical Exam: 157 There were no vitals taken for this visit. GENERAL:  Well developed, well nourished, woman sitting comfortably in the exam room in no acute distress. MENTAL STATUS:  Alert and oriented to person, place and time. HEAD:  Short frosted hair.  Normocephalic, atraumatic, face symmetric, no Cushingoid features. EYES:  Light brown eyes.  Pupils equal round and reactive to light and accomodation.  No conjunctivitis or scleral icterus. ENT:  Oropharynx clear without lesion.  Tongue normal. Mucous membranes moist.  RESPIRATORY:  Clear to auscultation without rales, wheezes or rhonchi. CARDIOVASCULAR:  Regular rate and rhythm without murmur, rub or gallop. ABDOMEN:  Soft, non-tender, with active bowel sounds, and no hepatosplenomegaly.  No masses. SKIN:  No rashes, ulcers or  lesions. EXTREMITIES: No edema, no skin discoloration or tenderness.  No palpable cords. LYMPH NODES: No palpable cervical, supraclavicular, axillary or inguinal adenopathy  NEUROLOGICAL: Unremarkable. PSYCH:  Appropriate.   No visits with results within 3 Day(s) from this visit.  Latest known visit with results is:  Office Visit on 05/14/2017  Component Date Value Ref Range Status  . WBC 05/14/2017 9.7  3.8 - 10.8 K/uL Final  . RBC 05/14/2017 4.78  3.80 - 5.10 MIL/uL Final  . Hemoglobin 05/14/2017 14.0  11.7 - 15.5 g/dL Final  . HCT 05/14/2017 40.8  35.0 - 45.0 % Final  . MCV 05/14/2017 85.4  80.0 - 100.0 fL Final  . MCH 05/14/2017 29.3  27.0 - 33.0 pg Final  . MCHC 05/14/2017 34.3  32.0 - 36.0 g/dL Final  . RDW 05/14/2017 13.6  11.0 - 15.0 % Final  . Platelets 05/14/2017 321  140 - 400 K/uL Final  . MPV 05/14/2017 9.8  7.5 - 12.5 fL Final  . Neutro Abs 05/14/2017 8439* 1,500 - 7,800 cells/uL Final  . Lymphs Abs 05/14/2017 970  850 - 3,900 cells/uL Final  . Monocytes Absolute 05/14/2017 291  200 - 950 cells/uL Final  . Eosinophils Absolute 05/14/2017 0* 15 - 500 cells/uL Final  . Basophils Absolute 05/14/2017 0  0 - 200 cells/uL Final  . Neutrophils Relative % 05/14/2017 87  % Final  . Lymphocytes Relative 05/14/2017 10  % Final  . Monocytes Relative 05/14/2017 3  % Final  . Eosinophils Relative 05/14/2017 0  % Final  . Basophils Relative 05/14/2017 0  % Final  . Smear Review 05/14/2017 Criteria for review not met   Final  . Sodium 05/14/2017 135  135 - 146 mmol/L Final  . Potassium 05/14/2017 4.4  3.5 - 5.3 mmol/L Final  . Chloride 05/14/2017 100  98 - 110 mmol/L Final  . CO2 05/14/2017 21  20 - 32 mmol/L Final   Comment: ** Please  note change in reference range(s). **     . Glucose, Bld 05/14/2017 185* 65 - 99 mg/dL Final  . BUN 05/14/2017 17  7 - 25 mg/dL Final  . Creat 05/14/2017 0.90  0.50 - 1.10 mg/dL Final  . Total Bilirubin 05/14/2017 0.4  0.2 - 1.2 mg/dL Final  .  Alkaline Phosphatase 05/14/2017 68  33 - 115 U/L Final  . AST 05/14/2017 16  10 - 35 U/L Final  . ALT 05/14/2017 22  6 - 29 U/L Final  . Total Protein 05/14/2017 7.2  6.1 - 8.1 g/dL Final  . Albumin 05/14/2017 4.8  3.6 - 5.1 g/dL Final  . Calcium 05/14/2017 9.7  8.6 - 10.2 mg/dL Final  . Source 05/14/2017 Whole Blood   Final  . VZV DNA, QL PCR 05/14/2017 Not Detected  Not Detected Final   Comment: This test was developed and its analytical performance characteristics have been determined by [employer], [place], [place]. It has not been cleared or approved by the FDA. This assay has been validated pursuant to the CLIA regulations and is used for clinical purposes. This test is performed pursuant to a license agreement with [employer].   . Varicella IgG 05/14/2017 <135.00  <135.00 Index Final   Comment:        Index           Interpretation      =====           ==============      < 135.00          Negative      135.00-164.99     Equivocal      >= 165.00         Positive   A positive result indicates that the patient has antibody to VZV but does not differentiate between an active or past infection. The clinical diagnosis must be interpreted in conjunction with the clinical signs and symptoms of the patient. This assay reliably measures immunity due to previous infection but may not be sensitive enough to detect antibodies induced by vaccination. Thus, a negative result in a vaccinated individual does not necessarily indicate susceptibility to VZV infection.     . Varicella Zoster Ab IgM 05/14/2017 <=0.90  <=0.90 Final   Comment:  < or = 0.90 Negative  0.91 - 1.09 Equivocal  > or = 1.10 Positive Results from any one IgM assay should not be used as a sole determinant of a current or recent infection. Because an IgM test can yield false positive results and low levels of IgM antibody may persist for more than 12 months post infection,  reliance on a single test result could be misleading. If an acute infection is suspected, consider obtaining a new specimen and submit for both IgG and IgM testing in two or more weeks.   Marland Kitchen RMSF IgG 05/14/2017 NOT DETECTED  NOT DETECTED Final  . RMSF IgM 05/14/2017 NOT DETECTED  NOT DETECTED Final   Comment: Cross-reactivity between the Spotted Fever and Typhus groups is minor. Significant cross reactivity within the Spotted Fever or Typhus group precludes speciation of the rickettsiae.   . B burgdorferi Ab IgG+IgM 05/14/2017 <0.90  <0.90 Index Final   Comment:                      Index                Interpretation                    -----                --------------                    <  0.90               NEGATIVE                    0.90-1.09            EQUIVOCAL                    > 1.09               POSITIVE   As recommended by the Food and Drug Administration (FDA), all samples with positive or equivocal results on the B. burgdorferi antibody screen will be tested by Western Blot/Immunoblot. Positive or equivocal screening test results should not be interpreted as truly positive until verified as such using a supplemental assay (e.g., B. burgdorferi blot).   The screening test and/or blot for B. burgdorferi antibodies may be falsely negative in early stages of Lyme disease, including the period when erythema migrans is apparent.     . Rapid Strep A Screen 05/14/2017 Negative  Negative Final    Assessment:  Niajah E Fonseca is a 46 y.o. female with 2-3 month history of fatigue, weight loss, drenching night sweats, and after bath itching worrisome for lymphoma.  She has upper abdominal pain.  One of 2 guaiac cards were positive.    Abdominal ultrasound on 01/16/2017 revealed a fatty slightly elongated liver and top normal size spleen (12.2 cm; volume 411 cc).  Abdominal and pelvic CT on 03/14/2017 revealed no acute findings.  Spleen was normal.  Chest x-ray on  01/09/2017 was normal.  CBC on 01/01/2017 included a hematocrit of 44.8, hemoglobin 15.1, MCV 85.8, platelets 312,000, white count 4800 with an ANC of 2200. Differential was unremarkable.  Iron studies on 02/13/2017 were normal.  She was recently diagnosed with sleep apnea.  CPAP has not helped her fatigue.   She has a poor appetite and abdominal discomfort. She underwent EGD and colonoscopy on 03/13/2017 (no results are available).  Plan: 1.  Labs today:  CBC, CMP, and LDH.  2.  Review abdominal imaging.  EGD and colonoscopy report unavailable. 3.  Discuss performing a work-up of erythrocytosis if hemoglobin > 16. 4.  Discuss consideration of chest CT to r/o adenopathy if concern for weight loss and B symptoms (CXR was negative). 5.  RTC in 3 month MD assessment and lavs (CBC with diff, CMP, LDH, uric acid)   Lequita Asal, MD  06/22/2017, 6:24 AM

## 2017-06-25 DIAGNOSIS — G4733 Obstructive sleep apnea (adult) (pediatric): Secondary | ICD-10-CM | POA: Diagnosis not present

## 2017-07-12 ENCOUNTER — Encounter: Payer: Self-pay | Admitting: *Deleted

## 2017-07-16 ENCOUNTER — Other Ambulatory Visit: Payer: Self-pay | Admitting: Family

## 2017-07-16 DIAGNOSIS — E039 Hypothyroidism, unspecified: Secondary | ICD-10-CM

## 2017-07-20 ENCOUNTER — Encounter: Payer: Self-pay | Admitting: *Deleted

## 2017-07-31 NOTE — Telephone Encounter (Signed)
Called pt. And asked her to please call the office and ask for [facility]. She called back a few minutes ago. I made her first nucala appt. (2 hr. Wait) for 08/03/17 at 2:30 this Fri., nothing further needed.

## 2017-08-03 ENCOUNTER — Ambulatory Visit (INDEPENDENT_AMBULATORY_CARE_PROVIDER_SITE_OTHER): Payer: 59

## 2017-08-03 DIAGNOSIS — J4540 Moderate persistent asthma, uncomplicated: Secondary | ICD-10-CM | POA: Diagnosis not present

## 2017-08-06 MED ORDER — MEPOLIZUMAB 100 MG ~~LOC~~ SOLR
100.0000 mg | SUBCUTANEOUS | Status: DC
  Administered 2017-08-03: 100 mg via SUBCUTANEOUS

## 2017-08-06 NOTE — Progress Notes (Signed)
Documentation of medication administration and charges of Nucala have been completed by Lindsay Lemons, CMA based on the Nucala documentation sheet completed by Tammy Scott.   

## 2017-08-13 ENCOUNTER — Other Ambulatory Visit: Payer: Self-pay | Admitting: Family

## 2017-08-13 DIAGNOSIS — F321 Major depressive disorder, single episode, moderate: Secondary | ICD-10-CM

## 2017-08-13 DIAGNOSIS — R5383 Other fatigue: Secondary | ICD-10-CM

## 2017-08-13 DIAGNOSIS — E119 Type 2 diabetes mellitus without complications: Secondary | ICD-10-CM

## 2017-08-14 ENCOUNTER — Encounter: Payer: Self-pay | Admitting: Family

## 2017-08-15 ENCOUNTER — Ambulatory Visit: Payer: Self-pay | Admitting: Family

## 2017-08-16 ENCOUNTER — Telehealth: Payer: 59 | Admitting: Family

## 2017-08-16 DIAGNOSIS — J029 Acute pharyngitis, unspecified: Secondary | ICD-10-CM

## 2017-08-16 MED ORDER — BENZONATATE 100 MG PO CAPS: 100.0000 mg | ORAL_CAPSULE | Freq: Three times a day (TID) | ORAL | 0 refills | Status: DC | PRN

## 2017-08-16 MED ORDER — PREDNISONE 5 MG PO TABS: 5.0000 mg | ORAL_TABLET | ORAL | 0 refills | Status: DC

## 2017-08-16 NOTE — Progress Notes (Signed)
Thank you for the details you included in the comment boxes. Those details are very helpful in determining the best course of treatment for you and help [place] to provide the best care  We are sorry that you are not feeling well.  Here is how we plan to help!  Based on your presentation I believe you most likely have A cough due to a virus.  This is called viral bronchitis and is best treated by rest, plenty of fluids and control of the cough.  You may use Ibuprofen or Tylenol as directed to help your symptoms.     In addition you may use A non-prescription cough medication called Mucinex DM: take 2 tablets every 12 hours. and A prescription cough medication called Tessalon Perles 100mg . You may take 1-2 capsules every 8 hours as needed for your cough.  Sterapred 5 mg dosepak  From your responses in the eVisit questionnaire you describe inflammation in the upper respiratory tract which is causing a significant cough.  This is commonly called Bronchitis and has four common causes:    Allergies  Viral Infections  Acid Reflux  Bacterial Infection Allergies, viruses and acid reflux are treated by controlling symptoms or eliminating the cause. An example might be a cough caused by taking certain blood pressure medications. You stop the cough by changing the medication. Another example might be a cough caused by acid reflux. Controlling the reflux helps control the cough.  USE OF BRONCHODILATOR ("RESCUE") INHALERS: There is a risk from using your bronchodilator too frequently.  The risk is that over-reliance on a medication which only relaxes the muscles surrounding the breathing tubes can reduce the effectiveness of medications prescribed to reduce swelling and congestion of the tubes themselves.  Although you feel brief relief from the bronchodilator inhaler, your asthma may actually be worsening with the tubes becoming more swollen and filled with mucus.  This can delay other crucial treatments, such  as oral steroid medications. If you need to use a bronchodilator inhaler daily, several times per day, you should discuss this with your provider.  There are probably better treatments that could be used to keep your asthma under control.     HOME CARE . Only take medications as instructed by your medical team. . Complete the entire course of an antibiotic. . Drink plenty of fluids and get plenty of rest. . Avoid close contacts especially the very young and the elderly . Cover your mouth if you cough or cough into your sleeve. . Always remember to wash your hands . A steam or ultrasonic humidifier can help congestion.   GET HELP RIGHT AWAY IF: . You develop worsening fever. . You become short of breath . You cough up blood. . Your symptoms persist after you have completed your treatment plan MAKE SURE YOU   Understand these instructions.  Will watch your condition.  Will get help right away if you are not doing well or get worse.  Your e-visit answers were reviewed by a board certified advanced clinical practitioner to complete your personal care plan.  Depending on the condition, your plan could have included both over the counter or prescription medications. If there is a problem please reply  once you have received a response from your provider. Your safety is important to [place].  If you have drug allergies check your prescription carefully.    You can use MyChart to ask questions about today's visit, request a non-urgent call back, or ask for a work  or school excuse for 24 hours related to this e-Visit. If it has been greater than 24 hours you will need to follow up with your provider, or enter a new e-Visit to address those concerns. You will get an e-mail in the next two days asking about your experience.  I hope that your e-visit has been valuable and will speed your recovery. Thank you for using e-visits.

## 2017-08-20 ENCOUNTER — Encounter: Payer: Self-pay | Admitting: Family

## 2017-08-20 ENCOUNTER — Ambulatory Visit (INDEPENDENT_AMBULATORY_CARE_PROVIDER_SITE_OTHER): Payer: 59 | Admitting: Family

## 2017-08-20 ENCOUNTER — Ambulatory Visit: Payer: Self-pay | Admitting: Family

## 2017-08-20 VITALS — BP 128/76 | HR 78 | Temp 97.7°F | Ht 62.0 in | Wt 160.0 lb

## 2017-08-20 DIAGNOSIS — J40 Bronchitis, not specified as acute or chronic: Secondary | ICD-10-CM | POA: Insufficient documentation

## 2017-08-20 DIAGNOSIS — K760 Fatty (change of) liver, not elsewhere classified: Secondary | ICD-10-CM

## 2017-08-20 MED ORDER — HYDROCODONE-HOMATROPINE 5-1.5 MG/5ML PO SYRP: 5.0000 mL | ORAL_SOLUTION | Freq: Every evening | ORAL | 0 refills | Status: DC | PRN

## 2017-08-20 MED ORDER — DOXYCYCLINE HYCLATE 100 MG PO TABS
100.0000 mg | ORAL_TABLET | Freq: Two times a day (BID) | ORAL | 0 refills | Status: DC
Start: 2017-08-20 — End: 2017-10-10

## 2017-08-20 NOTE — Progress Notes (Signed)
Pre visit review using our clinic review tool, if applicable. No additional management support is needed unless otherwise documented below in the visit note. 

## 2017-08-20 NOTE — Progress Notes (Signed)
Subjective:    Patient ID: Bridget Peters, female    DOB: 05-15-1971, 46 y.o.   MRN: [id number]  CC: Bridget Peters is a 46 y.o. female who presents today for an acute visit.    HPI: evisit 4 days ago and advised delsym, tessalon with no relief . Hasn't started prednisone from evisit. States worsening cough today. Has been coughing 2 weeks. Worse when moving around.  Congestion, runny nose. Fever 'on and off'. Tmax 2 days ago 101F.  Some wheezing, sob 'during a coughing spell. ' No CP, ear pain.   Asthma- using albuterol, singulair, flonase; triggers by change in weather.   Notes fatigue hasn't gotten worse. Thinks work stress is contributing.   Hasn't followed up with Dr Mike Gip. Had CT Abdomen and pelvis, no lymphadenopathy. Didn't feel need to go back  Unable to see results from egd, colonoscopy; patient will get.   GI 02/2017- reviewed note.      HISTORY:  Past Medical History:  Diagnosis Date  . Allergy    Seasonal  . Asthma   . Depression   . Diabetes mellitus without complication ([place])    Pt takes Metformin.  Marland Kitchen Hypertension   . Pneumonia   . Shortness of breath   . Sleep apnea   . Thyroid disease    Past Surgical History:  Procedure Laterality Date  . BREAST SURGERY  01/2013   Breast Reduction   . NASAL SINUS SURGERY    . NASAL SINUS SURGERY  02/2014  . REDUCTION MAMMAPLASTY Bilateral 2014   Family History  Problem Relation Age of Onset  . Hypertension Mother   . Hypertension Father   . Colon polyps Father   . Cancer Maternal Aunt        Breast Cancer  . Breast cancer Maternal Aunt   . Breast cancer Paternal Aunt        Breast Cancer  . Cancer Maternal Grandmother 78       Bladder Cancer  . Stroke Maternal Grandmother   . Breast cancer Maternal Grandmother 60  . Cancer Maternal Grandfather 11       Esophagus Cancer  . Colon cancer Neg Hx   . Ovarian cancer Neg Hx     Allergies: Milk-related compounds; Shellfish allergy; Wheat bran;  Xolair [omalizumab]; and Isovue [iopamidol] Current Outpatient Medications on File Prior to Visit  Medication Sig Dispense Refill  . albuterol (PROVENTIL HFA;VENTOLIN HFA) 108 (90 Base) MCG/ACT inhaler Inhale 2 puffs into the lungs every 4 (four) hours as needed for wheezing or shortness of breath. 1 Inhaler 6  . albuterol (PROVENTIL) (2.5 MG/3ML) 0.083% nebulizer solution Take 3 mLs (2.5 mg total) by nebulization every 6 (six) hours as needed for wheezing or shortness of breath. 75 mL 12  . amLODipine (NORVASC) 2.5 MG tablet Take 1 tablet (2.5 mg total) by mouth daily. 90 tablet 3  . benzonatate (TESSALON PERLES) 100 MG capsule Take 1-2 capsules (100-200 mg total) every 8 (eight) hours as needed by mouth for cough. 30 capsule 0  . buPROPion (WELLBUTRIN XL) 150 MG 24 hr tablet Take 1 tablet (150 mg total) by mouth 2 (two) times daily. Start 150 mg ER PO qam, increase after 3 days to 150 mg ER PO BID. 60 tablet 3  . cetirizine (ZYRTEC) 10 MG tablet Take 1 tablet (10 mg total) by mouth daily. 30 tablet 5  . clindamycin (CLEOCIN T) 1 % external solution     . EPIPEN 2-PAK 0.3 MG/0.3ML  SOAJ injection   11  . estradiol (ESTRACE) 0.1 MG/GM vaginal cream 0.5 g intravaginally 1-3 times per week. 42.5 g 0  . fexofenadine (ALLEGRA) 180 MG tablet Take 180 mg by mouth daily.  11  . fluticasone (FLONASE) 50 MCG/ACT nasal spray Place 2 sprays into both nostrils daily. 16 g 1  . fluticasone (VERAMYST) 27.5 MCG/SPRAY nasal spray Place 2 sprays into the nose daily. 10 g 12  . fluticasone furoate-vilanterol (BREO ELLIPTA) 200-25 MCG/INH AEPB Inhale 1 puff into the lungs daily. 60 each 5  . INVOKANA 300 MG TABS tablet TAKE 1 TABLET BY MOUTH DAILY. 90 tablet 3  . levocetirizine (XYZAL) 5 MG tablet Take 1 tablet (5 mg total) by mouth every evening. 30 tablet 1  . levothyroxine (SYNTHROID, LEVOTHROID) 75 MCG tablet TAKE 1 TABLET BY MOUTH DAILY. 90 tablet 1  . Mepolizumab (NUCALA) 100 MG SOLR Inject 100 mg into the skin  every 30 (thirty) days. 1 each 5  . metFORMIN (GLUCOPHAGE XR) 500 MG 24 hr tablet Start 500mg  PO qpm. 90 tablet 3  . methylPREDNISolone (MEDROL DOSEPAK) 4 MG TBPK tablet Take 6 pills on day one then decrease by 1 pill each day 21 tablet 0  . montelukast (SINGULAIR) 10 MG tablet TAKE 1 TABLET (10 MG TOTAL) BY MOUTH AT BEDTIME. 30 tablet 5  . naproxen (NAPROSYN) 500 MG tablet Take 1 tablet (500 mg total) by mouth 2 (two) times daily with a meal. 30 tablet 0  . pantoprazole (PROTONIX) 40 MG tablet TAKE 1 TABLET BY MOUTH DAILY. 30 tablet 5  . predniSONE (DELTASONE) 10 MG tablet Take 1 tablet (10 mg total) by mouth daily with breakfast. 10 tablet 0  . predniSONE (DELTASONE) 5 MG tablet Take 1 tablet (5 mg total) as directed by mouth. sterapred generic taper 21 tablet 0  . rosuvastatin (CRESTOR) 40 MG tablet Take 1 tablet (40 mg total) by mouth daily. 90 tablet 3  . sertraline (ZOLOFT) 100 MG tablet TAKE 1 TABLET (100 MG TOTAL) BY MOUTH AT BEDTIME. 90 tablet 1   Current Facility-Administered Medications on File Prior to Visit  Medication Dose Route Frequency Provider Last Rate Last Dose  . dexamethasone (DECADRON) injection 10 mg  10 mg Intramuscular Once Versie Starks, PA-C      . Mepolizumab SOLR 100 mg  100 mg Subcutaneous Q28 days Flora Lipps, MD   100 mg at 08/03/17 0923  . omalizumab Arvid Right) injection 300 mg  300 mg Subcutaneous Q14 Days Flora Lipps, MD   300 mg at 06/15/16 1209    Social History   Tobacco Use  . Smoking status: Never Smoker  . Smokeless tobacco: Never Used  Substance Use Topics  . Alcohol use: Yes    Alcohol/week: 0.0 oz  . Drug use: No    Review of Systems  Constitutional: Positive for fatigue (unchanged). Negative for chills and fever.  HENT: Positive for congestion. Negative for ear pain, sinus pain and sore throat.   Respiratory: Positive for cough, shortness of breath and wheezing.   Cardiovascular: Negative for chest pain and palpitations.    Gastrointestinal: Negative for nausea and vomiting.  Neurological: Negative for headaches.      Objective:    BP 128/76   Pulse 78   Temp 97.7 F (36.5 C) (Oral)   Ht 5\' 2"  (1.575 m)   Wt 160 lb (72.6 kg)   SpO2 96%   BMI 29.26 kg/m    Physical Exam  Constitutional: She appears well-developed  and well-nourished.  HENT:  Head: Normocephalic and atraumatic.  Right Ear: Hearing, tympanic membrane, external ear and ear canal normal. No drainage, swelling or tenderness. No foreign bodies. Tympanic membrane is not erythematous and not bulging. No middle ear effusion. No decreased hearing is noted.  Left Ear: Hearing, tympanic membrane, external ear and ear canal normal. No drainage, swelling or tenderness. No foreign bodies. Tympanic membrane is not erythematous and not bulging.  No middle ear effusion. No decreased hearing is noted.  Nose: Rhinorrhea present. Right sinus exhibits no maxillary sinus tenderness and no frontal sinus tenderness. Left sinus exhibits no maxillary sinus tenderness and no frontal sinus tenderness.  Mouth/Throat: Uvula is midline, oropharynx is clear and moist and mucous membranes are normal. No oropharyngeal exudate, posterior oropharyngeal edema, posterior oropharyngeal erythema or tonsillar abscesses.  Eyes: Conjunctivae are normal.  Cardiovascular: Regular rhythm, normal heart sounds and normal pulses.  Pulmonary/Chest: Effort normal and breath sounds normal. She has no wheezes. She has no rhonchi. She has no rales.  Lymphadenopathy:       Head (right side): No submental, no submandibular, no tonsillar, no preauricular, no posterior auricular and no occipital adenopathy present.       Head (left side): No submental, no submandibular, no tonsillar, no preauricular, no posterior auricular and no occipital adenopathy present.    She has no cervical adenopathy.  Neurological: She is alert.  Skin: Skin is warm and dry.  Psychiatric: She has a normal mood and  affect. Her speech is normal and behavior is normal. Thought content normal.  Vitals reviewed.      Assessment & Plan:   Problem List Items Addressed This Visit      Respiratory   Bronchitis - Primary    sa02 96. Well appearing and afebrile today. Based on duration of symptoms and h/o asthma, appropriate to start antibiotic. Will also start taper of prednisone as given by e visit. Return precautions  Given.       Relevant Medications   doxycycline (VIBRA-TABS) 100 MG tablet   HYDROcodone-homatropine (HYCODAN) 5-1.5 MG/5ML syrup     Digestive   Fatty liver    Discussed diagnosis. Normal LFTs - normal 05/2017. Will monitor enzymes at follow up. Education provided on importance of modifying risk factors and controlling HTN, DM, HLD. Unfortunately not wearing cipap as not able to wear. Will continue to follow.            I have changed Amazin E. Turvey's doxycycline and HYDROcodone-homatropine. I am also having her maintain her fluticasone, albuterol, albuterol, metFORMIN, rosuvastatin, levocetirizine, cetirizine, naproxen, Mepolizumab, fluticasone furoate-vilanterol, fexofenadine, pantoprazole, montelukast, buPROPion, estradiol, clindamycin, EPIPEN 2-PAK, methylPREDNISolone, predniSONE, amLODipine, fluticasone, levothyroxine, INVOKANA, sertraline, benzonatate, and predniSONE. We will continue to administer omalizumab, dexamethasone, and Mepolizumab.   Meds ordered this encounter  Medications  . doxycycline (VIBRA-TABS) 100 MG tablet    Sig: Take 1 tablet (100 mg total) 2 (two) times daily by mouth.    Dispense:  14 tablet    Refill:  0    Order Specific Question:   Supervising Provider    Answer:   Deborra Medina L [[id number]]  . HYDROcodone-homatropine (HYCODAN) 5-1.5 MG/5ML syrup    Sig: Take 5 mLs at bedtime as needed by mouth for cough.    Dispense:  30 mL    Refill:  0    Order Specific Question:   Supervising Provider    Answer:   Crecencio Mc [[id number]]    Return  precautions given.  Risks, benefits, and alternatives of the medications and treatment plan prescribed today were discussed, and patient expressed understanding.   Education regarding symptom management and diagnosis given to patient on AVS.  Continue to follow with Burnard Hawthorne, FNP for routine health maintenance.   Addalyn E Hitsman and I agreed with plan.   Mable Paris, FNP

## 2017-08-20 NOTE — Assessment & Plan Note (Signed)
sa02 96. Well appearing and afebrile today. Based on duration of symptoms and h/o asthma, appropriate to start antibiotic. Will also start taper of prednisone as given by e visit. Return precautions  Given.

## 2017-08-20 NOTE — Assessment & Plan Note (Addendum)
Discussed diagnosis. Normal LFTs - normal 05/2017. Will monitor enzymes at follow up. Education provided on importance of modifying risk factors and controlling HTN, DM, HLD. Unfortunately not wearing cipap as not able to wear. Will continue to follow.

## 2017-08-20 NOTE — Patient Instructions (Addendum)
Will request EGD and colonoscopy records.    Rest, plenty of water.   Let me know if not better on antibiotic and starting prednisone from e visit    Please take cough medication at night only as needed. As we discussed, I do not recommend dosing throughout the day as coughing is a protective mechanism . It also helps to break up thick mucous.  Do not take cough suppressants with alcohol as can lead to trouble breathing. Advise caution if taking cough suppressant and operating machinery ( i.e driving a car) as you may feel very tired.    Please make follow up in spring so we can continue to follow liver enzymes.   Take care!

## 2017-10-10 ENCOUNTER — Telehealth: Payer: 59 | Admitting: Family

## 2017-10-10 ENCOUNTER — Encounter: Payer: Self-pay | Admitting: Family

## 2017-10-10 ENCOUNTER — Encounter: Payer: Self-pay | Admitting: Internal Medicine

## 2017-10-10 DIAGNOSIS — J019 Acute sinusitis, unspecified: Secondary | ICD-10-CM

## 2017-10-10 MED ORDER — DOXYCYCLINE HYCLATE 100 MG PO TABS: 100.0000 mg | ORAL_TABLET | Freq: Two times a day (BID) | ORAL | 0 refills | Status: DC

## 2017-10-10 NOTE — Progress Notes (Signed)
We are sorry that you are not feeling well.  Here is how we plan to help!  Based on what you have shared with me it looks like you have sinusitis.  Sinusitis is inflammation and infection in the sinus cavities of the head.  Based on your presentation I believe you most likely have Acute Bacterial Sinusitis.  This is an infection caused by bacteria and is treated with antibiotics. I have prescribed Doxycycline 100mg  by mouth twice a day for 10 days. You may use an oral decongestant such as Mucinex D or if you have glaucoma or high blood pressure use plain Mucinex. Saline nasal spray help and can safely be used as often as needed for congestion.  If you develop worsening sinus pain, fever or notice severe headache and vision changes, or if symptoms are not better after completion of antibiotic, please schedule an appointment with a health care provider.    We can not send in cough syrup  Because it is controlled medication.    Sinus infections are not as easily transmitted as other respiratory infection, however we still recommend that you avoid close contact with loved ones, especially the very young and elderly.  Remember to wash your hands thoroughly throughout the day as this is the number one way to prevent the spread of infection!  Home Care:  Only take medications as instructed by your medical team.  Complete the entire course of an antibiotic.  Do not take these medications with alcohol.  A steam or ultrasonic humidifier can help congestion.  You can place a towel over your head and breathe in the steam from hot water coming from a faucet.  Avoid close contacts especially the very young and the elderly.  Cover your mouth when you cough or sneeze.  Always remember to wash your hands.  Get Help Right Away If:  You develop worsening fever or sinus pain.  You develop a severe head ache or visual changes.  Your symptoms persist after you have completed your treatment plan.  Make  sure you  Understand these instructions.  Will watch your condition.  Will get help right away if you are not doing well or get worse.  Your e-visit answers were reviewed by a board certified advanced clinical practitioner to complete your personal care plan.  Depending on the condition, your plan could have included both over the counter or prescription medications.  If there is a problem please reply  once you have received a response from your provider.  Your safety is important to [place].  If you have drug allergies check your prescription carefully.    You can use MyChart to ask questions about today's visit, request a non-urgent call back, or ask for a work or school excuse for 24 hours related to this e-Visit. If it has been greater than 24 hours you will need to follow up with your provider, or enter a new e-Visit to address those concerns.  You will get an e-mail in the next two days asking about your experience.  I hope that your e-visit has been valuable and will speed your recovery. Thank you for using e-visits.

## 2017-10-19 ENCOUNTER — Ambulatory Visit (INDEPENDENT_AMBULATORY_CARE_PROVIDER_SITE_OTHER): Payer: Self-pay | Admitting: Medical

## 2017-10-19 ENCOUNTER — Ambulatory Visit: Payer: Self-pay | Admitting: *Deleted

## 2017-10-19 VITALS — BP 174/89 | HR 82 | Temp 97.9°F | Wt 150.0 lb

## 2017-10-19 DIAGNOSIS — R42 Dizziness and giddiness: Secondary | ICD-10-CM

## 2017-10-19 DIAGNOSIS — J0140 Acute pansinusitis, unspecified: Secondary | ICD-10-CM

## 2017-10-19 DIAGNOSIS — I10 Essential (primary) hypertension: Secondary | ICD-10-CM

## 2017-10-19 MED ORDER — PREDNISONE 20 MG PO TABS: 40.0000 mg | ORAL_TABLET | Freq: Every day | ORAL | 0 refills | Status: AC

## 2017-10-19 MED ORDER — MECLIZINE HCL 32 MG PO TABS: 32.0000 mg | ORAL_TABLET | Freq: Three times a day (TID) | ORAL | 0 refills | Status: DC | PRN

## 2017-10-19 NOTE — Patient Instructions (Addendum)
Finish your antibiotic. Take the prednisone once a day with food for the next 5 days. Continue to rest and drink plenty of fluids. You may continue Tylenol as needed for pain. Schedule a follow up visit with your PCP next week if possible. If your headache or vertigo worsens, you should go to the ER.  Sinusitis, Adult Sinusitis is soreness and inflammation of your sinuses. Sinuses are hollow spaces in the bones around your face. Your sinuses are located:  Around your eyes.  In the middle of your forehead.  Behind your nose.  In your cheekbones.  Your sinuses and nasal passages are lined with a stringy fluid (mucus). Mucus normally drains out of your sinuses. When your nasal tissues become inflamed or swollen, the mucus can become trapped or blocked so air cannot flow through your sinuses. This allows bacteria, viruses, and funguses to grow, which leads to infection. Sinusitis can develop quickly and last for 7?10 days (acute) or for more than 12 weeks (chronic). Sinusitis often develops after a cold. What are the causes? This condition is caused by anything that creates swelling in the sinuses or stops mucus from draining, including:  Allergies.  Asthma.  Bacterial or viral infection.  Abnormally shaped bones between the nasal passages.  Nasal growths that contain mucus (nasal polyps).  Narrow sinus openings.  Pollutants, such as chemicals or irritants in the air.  A foreign object stuck in the nose.  A fungal infection. This is rare.  What increases the risk? The following factors may make you more likely to develop this condition:  Having allergies or asthma.  Having had a recent cold or respiratory tract infection.  Having structural deformities or blockages in your nose or sinuses.  Having a weak immune system.  Doing a lot of swimming or diving.  Overusing nasal sprays.  Smoking.  What are the signs or symptoms? The main symptoms of this condition are pain  and a feeling of pressure around the affected sinuses. Other symptoms include:  Upper toothache.  Earache.  Headache.  Bad breath.  Decreased sense of smell and taste.  A cough that may get worse at night.  Fatigue.  Fever.  Thick drainage from your nose. The drainage is often green and it may contain pus (purulent).  Stuffy nose or congestion.  Postnasal drip. This is when extra mucus collects in the throat or back of the nose.  Swelling and warmth over the affected sinuses.  Sore throat.  Sensitivity to light.  How is this diagnosed? This condition is diagnosed based on symptoms, a medical history, and a physical exam. To find out if your condition is acute or chronic, your health care provider may:  Look in your nose for signs of nasal polyps.  Tap over the affected sinus to check for signs of infection.  View the inside of your sinuses using an imaging device that has a light attached (endoscope).  If your health care provider suspects that you have chronic sinusitis, you may also:  Be tested for allergies.  Have a sample of mucus taken from your nose (nasal culture) and checked for bacteria.  Have a mucus sample examined to see if your sinusitis is related to an allergy.  If your sinusitis does not respond to treatment and it lasts longer than 8 weeks, you may have an MRI or CT scan to check your sinuses. These scans also help to determine how severe your infection is. In rare cases, a bone biopsy may be  done to rule out more serious types of fungal sinus disease. How is this treated? Treatment for sinusitis depends on the cause and whether your condition is chronic or acute. If a virus is causing your sinusitis, your symptoms will go away on their own within 10 days. You may be given medicines to relieve your symptoms, including:  Topical nasal decongestants. They shrink swollen nasal passages and let mucus drain from your sinuses.  Antihistamines. These  drugs block inflammation that is triggered by allergies. This can help to ease swelling in your nose and sinuses.  Topical nasal corticosteroids. These are nasal sprays that ease inflammation and swelling in your nose and sinuses.  Nasal saline washes. These rinses can help to get rid of thick mucus in your nose.  If your condition is caused by bacteria, you will be given an antibiotic medicine. If your condition is caused by a fungus, you will be given an antifungal medicine. Surgery may be needed to correct underlying conditions, such as narrow nasal passages. Surgery may also be needed to remove polyps. Follow these instructions at home: Medicines  Take, use, or apply over-the-counter and prescription medicines only as told by your health care provider. These may include nasal sprays.  If you were prescribed an antibiotic medicine, take it as told by your health care provider. Do not stop taking the antibiotic even if you start to feel better. Hydrate and Humidify  Drink enough water to keep your urine clear or pale yellow. Staying hydrated will help to thin your mucus.  Use a cool mist humidifier to keep the humidity level in your home above 50%.  Inhale steam for 10-15 minutes, 3-4 times a day or as told by your health care provider. You can do this in the bathroom while a hot shower is running.  Limit your exposure to cool or dry air. Rest  Rest as much as possible.  Sleep with your head raised (elevated).  Make sure to get enough sleep each night. General instructions  Apply a warm, moist washcloth to your face 3-4 times a day or as told by your health care provider. This will help with discomfort.  Wash your hands often with soap and water to reduce your exposure to viruses and other germs. If soap and water are not available, use hand sanitizer.  Do not smoke. Avoid being around people who are smoking (secondhand smoke).  Keep all follow-up visits as told by your health  care provider. This is important. Contact a health care provider if:  You have a fever.  Your symptoms get worse.  Your symptoms do not improve within 10 days. Get help right away if:  You have a severe headache.  You have persistent vomiting.  You have pain or swelling around your face or eyes.  You have vision problems.  You develop confusion.  Your neck is stiff.  You have trouble breathing. This information is not intended to replace advice given to you by your health care provider. Make sure you discuss any questions you have with your health care provider. Document Released: 09/25/2005 Document Revised: 05/21/2016 Document Reviewed: 07/21/2015 Elsevier Interactive Patient Education  [employer].

## 2017-10-19 NOTE — Telephone Encounter (Signed)
  Pt c/o having vertigo. Feels like the room is spinning. Stated that she is getting over a sinus infection and has an earache. She is unable to drive a long distance at this time.  Reason for Disposition . [1] MODERATE dizziness (e.g., vertigo; feels very unsteady, interferes with normal activities) AND [2] has NOT been evaluated by physician for this  Answer Assessment - Initial Assessment Questions 1. DESCRIPTION: "Describe your dizziness."     Feels like the room is spinning 2. VERTIGO: "Do you feel like either you or the room is spinning or tilting?"      Yes, spinning 3. LIGHTHEADED: "Do you feel lightheaded?" (e.g., somewhat faint, woozy, weak upon standing)     Woozy, weakness when standing 4. SEVERITY: "How bad is it?"  "Can you walk?"   - MILD - Feels unsteady but walking normally.   - MODERATE - Feels very unsteady when walking, but not falling; interferes with normal activities (e.g., school, work) .   - SEVERE - Unable to walk without falling (requires assistance).     moderate 5. ONSET:  "When did the dizziness begin?"     Started yesterday around 3pm 6. AGGRAVATING FACTORS: "Does anything make it worse?" (e.g., standing, change in head position)     Standing and change in head position 7. CAUSE: "What do you think is causing the dizziness?"     Possible sinus infection 8. RECURRENT SYMPTOM: "Have you had dizziness before?" If so, ask: "When was the last time?" "What happened that time?"     No never before 9. OTHER SYMPTOMS: "Do you have any other symptoms?" (e.g., headache, weakness, numbness, vomiting, earache)     Earache, nauseated yesterday 10. PREGNANCY: "Is there any chance you are pregnant?" "When was your last menstrual period?"       No LMP 47 years of age  Protocols used: DIZZINESS - VERTIGO-A-AH

## 2017-10-19 NOTE — Progress Notes (Signed)
Subjective:     Bridget Peters is a 47 y.o. female who presents for evaluation of ongoing sinus pain/pressure, now accompanied by dizziness and vertigo. She was evaluated 9 days ago via an e-visit, diagnosed with sinusitis and prescribed Doxycycline BID x 10 days (has 1 or 2 doses remaining to complete, usually effective for her sinus infections). Has noted minimal improvement with antibiotic. Has hx of allergic rhinitis, takes 2 different antihistamines daily (on advice of specialist) and 2 nasal sprays (Flonase and Veramyst). Pt states she had been experiencing sinus pressure/pain for 1 day before e-visit/initiation of antibiotic. Gets sinus infection several times/year. 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  Current symptoms include right ear pressure/pain, congestion, facial pain, headache described as frontal and consistent with previous episodes of sinusitis, sinus pressure and vertigo. She has had a minimal, nonproductive cough at night when she lays down, yellow nasal discharge. She has been taking Mucinex D (last taken 8-12 hours ago). 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 Sl nausea yesterday, no vomiting. Checked her BP at work yesterday when initially noted dizziness, was 120/68. Last took her BP meds last night. Has had elevated BP in past when ill. Did walk to clinic today from imaging center. BP appears generally well-controlled based on review of recent primary care visits. Has had some intermittent chills, no fever or sweats. Right ear pain/pressure noted overnight 2d ago, has not worsened. Mild burning sensation to throat at times, especially in AM. Headache is to forehead area, typical of her sinusitis, gets some worse as day goes on. Denies vision changes, focal neuro changes, chest  pain, shortness of breath or swelling to legs. Denies previous episodes of vertigo.   She is drinking plenty of fluids.  Past history is significant for asthma and occasional episodes of bronchitis. Patient is a non-smoker.  The following portions of the patient's history were reviewed and updated as appropriate: allergies, current medications, past family history, past medical history, past social history and problem list.  Review of Systems Pertinent items are noted in HPI.    Objective:    BP (!) 174/89   Pulse 82   Temp 97.9 F (36.6 C)   Wt 150 lb (68 kg)   SpO2 96%   BMI 27.44 kg/m    Blood pressure rechecked by provider during exam: 147/79 at rest, sitting General appearance: alert, cooperative and no distress Head: Normocephalic, without obvious abnormality, atraumatic, sinuses tender to percussion (maxillary and frontal) Ears: TMs intact and dull with small serous middle ear fluid bilaterally. No erythema, TMs not bulging. Canals normal. Nose: no discharge, turbinates pink, edematous, sinus tenderness bilateral Throat: lips, mucosa, and tongue normal; teeth and gums normal Neck: supple, symmetrical, trachea midline Lungs: clear to auscultation bilaterally Heart: regular rate and rhythm, S1, S2 normal, no murmur, click, rub or gallop Skin: Skin color, texture, turgor normal. No rashes or lesions Lymph nodes: No significant cervical lymphadenopathy. Neurologic: Mental status: Alert, oriented, thought content appropriate Cranial nerves: normal Gait: Normal    Assessment:    Acute sinusitis   -Discussed that sinusitis may be viral/allergic which may be why antibiotic has not been helpful  Hypertension -Reading decreased after few minutes at rest. Elevation possibly due in part to use of decongestant, activity prior to arrival at clinic, illness.  Vertigo Feel this  is due mostly to sinusitis/ear congestion, less likely related to HTN.  Plan:    -Will trial oral  steroid - Prednisone 40 mg daily x 5 days to help alleviate sinus/ear pressure and congestion. Reviewed possible side effects, advised to take early in day with food. Patient has tolerated oral steroids in past.  -Patient will complete remainder of antibiotic course.  -Continue nasal sprays and antihistamines as prescribed. Avoid/limit use of decongestants to avoid blood pressure elevation.  -Gave Meclizine to use PRN vertigo. Advised it will cause drowsiness.  -Nasal saline rinses as needed for congestion.  -May take Tylenol PRN headache.   -Continue supportive measures (rest, fluids, cough drops). Gave work note for today.   -Recommended patient schedule F/U w/PCP early next week to recheck BP and make sure vertigo/sinus symptoms have resolved.  -Advised to go ED with severe/worsening headache, worsening vertigo, chest pain, SOB, weakness/numbness or swelling in legs.

## 2017-12-17 ENCOUNTER — Encounter: Payer: Self-pay | Admitting: Family

## 2017-12-17 ENCOUNTER — Encounter: Payer: Self-pay | Admitting: Internal Medicine

## 2017-12-18 ENCOUNTER — Other Ambulatory Visit: Payer: Self-pay | Admitting: Internal Medicine

## 2017-12-18 MED ORDER — FEXOFENADINE HCL 180 MG PO TABS: 180.0000 mg | ORAL_TABLET | Freq: Every day | ORAL | 0 refills | Status: DC

## 2017-12-18 MED ORDER — MONTELUKAST SODIUM 10 MG PO TABS: 10.0000 mg | ORAL_TABLET | Freq: Every day | ORAL | 0 refills | Status: DC

## 2017-12-19 ENCOUNTER — Other Ambulatory Visit: Payer: Self-pay | Admitting: Family

## 2017-12-19 DIAGNOSIS — R21 Rash and other nonspecific skin eruption: Secondary | ICD-10-CM

## 2017-12-19 MED ORDER — CLINDAMYCIN PHOSPHATE 1 % EX SOLN: Freq: Two times a day (BID) | CUTANEOUS | 1 refills | Status: DC | PRN

## 2018-01-01 DIAGNOSIS — J4550 Severe persistent asthma, uncomplicated: Secondary | ICD-10-CM | POA: Diagnosis not present

## 2018-01-01 DIAGNOSIS — Z91018 Allergy to other foods: Secondary | ICD-10-CM | POA: Diagnosis not present

## 2018-01-01 DIAGNOSIS — J3089 Other allergic rhinitis: Secondary | ICD-10-CM | POA: Diagnosis not present

## 2018-01-01 DIAGNOSIS — J301 Allergic rhinitis due to pollen: Secondary | ICD-10-CM | POA: Diagnosis not present

## 2018-01-01 DIAGNOSIS — R091 Pleurisy: Secondary | ICD-10-CM | POA: Diagnosis not present

## 2018-01-01 DIAGNOSIS — J3081 Allergic rhinitis due to animal (cat) (dog) hair and dander: Secondary | ICD-10-CM | POA: Diagnosis not present

## 2018-01-01 DIAGNOSIS — J209 Acute bronchitis, unspecified: Secondary | ICD-10-CM | POA: Diagnosis not present

## 2018-01-02 ENCOUNTER — Other Ambulatory Visit
Admission: RE | Admit: 2018-01-02 | Discharge: 2018-01-02 | Disposition: A | Discharge status: Home / Self Care | Payer: 59 | Source: Ambulatory Visit | Attending: Allergy and Immunology | Admitting: Allergy and Immunology

## 2018-01-02 ENCOUNTER — Telehealth: Payer: 59 | Admitting: Family

## 2018-01-02 DIAGNOSIS — J4550 Severe persistent asthma, uncomplicated: Secondary | ICD-10-CM | POA: Diagnosis not present

## 2018-01-02 DIAGNOSIS — J301 Allergic rhinitis due to pollen: Secondary | ICD-10-CM | POA: Diagnosis not present

## 2018-01-02 DIAGNOSIS — J019 Acute sinusitis, unspecified: Secondary | ICD-10-CM

## 2018-01-02 DIAGNOSIS — Z91018 Allergy to other foods: Secondary | ICD-10-CM | POA: Insufficient documentation

## 2018-01-02 DIAGNOSIS — J3089 Other allergic rhinitis: Secondary | ICD-10-CM | POA: Insufficient documentation

## 2018-01-02 DIAGNOSIS — J3081 Allergic rhinitis due to animal (cat) (dog) hair and dander: Secondary | ICD-10-CM | POA: Insufficient documentation

## 2018-01-02 DIAGNOSIS — R091 Pleurisy: Secondary | ICD-10-CM | POA: Insufficient documentation

## 2018-01-02 LAB — CBC WITH DIFFERENTIAL/PLATELET
BASOS PCT: 0 %
Basophils Absolute: 0 10*3/uL (ref 0–0.1)
EOS ABS: 0.4 10*3/uL (ref 0–0.7)
EOS PCT: 6 %
HEMATOCRIT: 41.4 % (ref 35.0–47.0)
Hemoglobin: 13.8 g/dL (ref 12.0–16.0)
Lymphocytes Relative: 25 %
Lymphs Abs: 1.7 10*3/uL (ref 1.0–3.6)
MCH: 28.5 pg (ref 26.0–34.0)
MCHC: 33.4 g/dL (ref 32.0–36.0)
MCV: 85.4 fL (ref 80.0–100.0)
MONO ABS: 0.5 10*3/uL (ref 0.2–0.9)
Monocytes Relative: 7 %
Neutro Abs: 4.4 10*3/uL (ref 1.4–6.5)
Neutrophils Relative %: 62 %
PLATELETS: 280 10*3/uL (ref 150–440)
RBC: 4.84 MIL/uL (ref 3.80–5.20)
RDW: 13.6 % (ref 11.5–14.5)
WBC: 7 10*3/uL (ref 3.6–11.0)

## 2018-01-02 MED ORDER — AMOXICILLIN-POT CLAVULANATE 875-125 MG PO TABS: 1.0000 | ORAL_TABLET | Freq: Two times a day (BID) | ORAL | 0 refills | Status: DC

## 2018-01-02 NOTE — Progress Notes (Signed)

## 2018-01-04 LAB — MISC LABCORP TEST (SEND OUT): LABCORP TEST CODE: 163691

## 2018-01-04 LAB — COMPLEMENT, TOTAL: Compl, Total (CH50): >60 U/mL (ref >41)

## 2018-01-07 LAB — MISC LABCORP TEST (SEND OUT)
LABCORP TEST CODE: 62695
LABCORP TEST CODE: 812166
Labcorp test code: 602453
Labcorp test code: 602459

## 2018-01-07 LAB — IMMUNOGLOBULINS A/E/G/M, SERUM
IGE (IMMUNOGLOBULIN E), SERUM: 538 [IU]/mL — AB (ref 6–495)
IGM (IMMUNOGLOBULIN M), SRM: 55 mg/dL (ref 26–217)
IgA: 227 mg/dL (ref 87–352)
IgG (Immunoglobin G), Serum: 638 mg/dL — ABNORMAL LOW (ref 700–1600)

## 2018-01-22 ENCOUNTER — Other Ambulatory Visit: Payer: Self-pay | Admitting: Internal Medicine

## 2018-02-05 ENCOUNTER — Other Ambulatory Visit: Payer: Self-pay | Admitting: Family

## 2018-02-05 DIAGNOSIS — Z1231 Encounter for screening mammogram for malignant neoplasm of breast: Secondary | ICD-10-CM

## 2018-02-13 ENCOUNTER — Ambulatory Visit: Payer: 59 | Admitting: Family

## 2018-02-13 ENCOUNTER — Encounter: Payer: Self-pay | Admitting: Family

## 2018-02-13 VITALS — BP 138/82 | HR 74 | Temp 98.7°F | Resp 15 | Wt 166.1 lb

## 2018-02-13 DIAGNOSIS — R0981 Nasal congestion: Secondary | ICD-10-CM | POA: Diagnosis not present

## 2018-02-13 DIAGNOSIS — I10 Essential (primary) hypertension: Secondary | ICD-10-CM | POA: Diagnosis not present

## 2018-02-13 DIAGNOSIS — Z23 Encounter for immunization: Secondary | ICD-10-CM

## 2018-02-13 DIAGNOSIS — J4540 Moderate persistent asthma, uncomplicated: Secondary | ICD-10-CM

## 2018-02-13 DIAGNOSIS — E119 Type 2 diabetes mellitus without complications: Secondary | ICD-10-CM

## 2018-02-13 DIAGNOSIS — Z1231 Encounter for screening mammogram for malignant neoplasm of breast: Secondary | ICD-10-CM | POA: Diagnosis not present

## 2018-02-13 LAB — MICROALBUMIN / CREATININE URINE RATIO
Creatinine,U: 19.3 mg/dL
Microalb Creat Ratio: 3.6 mg/g (ref 0.0–30.0)
Microalb, Ur: <0.7 mg/dL (ref 0.0–1.9)

## 2018-02-13 LAB — BASIC METABOLIC PANEL
BUN: 18 mg/dL (ref 6–23)
CALCIUM: 9.8 mg/dL (ref 8.4–10.5)
CO2: 26 mEq/L (ref 19–32)
CREATININE: 1.22 mg/dL — AB (ref 0.40–1.20)
Chloride: 99 mEq/L (ref 96–112)
GFR: 50.2 mL/min — ABNORMAL LOW (ref >60.00)
GLUCOSE: 148 mg/dL — AB (ref 70–99)
Potassium: 4 mEq/L (ref 3.5–5.1)
Sodium: 136 mEq/L (ref 135–145)

## 2018-02-13 LAB — HEMOGLOBIN A1C: HEMOGLOBIN A1C: 7.4 % — AB (ref 4.6–6.5)

## 2018-02-13 MED ORDER — DOXYCYCLINE HYCLATE 100 MG PO TABS: 100.0000 mg | ORAL_TABLET | Freq: Two times a day (BID) | ORAL | 0 refills | Status: DC

## 2018-02-13 MED ORDER — PREDNISONE 10 MG PO TABS: ORAL_TABLET | ORAL | 0 refills | Status: DC

## 2018-02-13 NOTE — Assessment & Plan Note (Signed)
At goal. Continue current regimen. 

## 2018-02-13 NOTE — Assessment & Plan Note (Signed)
Afebrile.  Based on duration of symptoms, reasonable to start antibiotic.  Patient let me know if she is not better.

## 2018-02-13 NOTE — Patient Instructions (Addendum)
Labs today  Start doxcycline. Probiotics  Fill prednisone ONLY if you need  Need eye exam  Next appt , make physical.   Please schedule your mammogram

## 2018-02-13 NOTE — Assessment & Plan Note (Signed)
Suspect controlled.  Pending A1c

## 2018-02-13 NOTE — Progress Notes (Signed)
Subjective:    Patient ID: Bridget Peters, female    DOB: 26-Feb-1971, 47 y.o.   MRN: [id number]  CC: Bridget Peters is a 47 y.o. female who presents today for follow up.   HPI: Complains of sinus congestion x 2 weeks , worsening,   Asthma bothering her night, wheezing, chest tightness. Uses nebulizer 3-4 times per week. Ran out of rescue inhaler.   Following with allergy. Thinks fatigue has gotten better over winter however thinks when pollen starts, ashtma becomes uncontrolled. Not sleeping as well.    Follows with Dr Mortimer Fries  DM- has been on a diet. Compliant with medication.   HTN- compliant with medication. No CP.     HISTORY:  Past Medical History:  Diagnosis Date  . Allergy    Seasonal  . Asthma   . Depression   . Diabetes mellitus without complication ([place])    Pt takes Metformin.  Marland Kitchen Hypertension   . Pneumonia   . Shortness of breath   . Sleep apnea   . Thyroid disease    Past Surgical History:  Procedure Laterality Date  . BREAST SURGERY  01/2013   Breast Reduction   . NASAL SINUS SURGERY    . NASAL SINUS SURGERY  02/2014  . REDUCTION MAMMAPLASTY Bilateral 2014   Family History  Problem Relation Age of Onset  . Hypertension Mother   . Endometrial cancer Mother 77  . Hypertension Father   . Colon polyps Father   . Cancer Maternal Aunt        Breast Cancer  . Breast cancer Maternal Aunt   . Breast cancer Paternal Aunt        Breast Cancer  . Cancer Maternal Grandmother 54       Bladder Cancer  . Stroke Maternal Grandmother   . Breast cancer Maternal Grandmother 60  . Cancer Maternal Grandfather 46       Esophagus Cancer  . Colon cancer Neg Hx   . Ovarian cancer Neg Hx     Allergies: Milk-related compounds; Shellfish allergy; Wheat bran; Xolair [omalizumab]; and Isovue [iopamidol] Current Outpatient Medications on File Prior to Visit  Medication Sig Dispense Refill  . albuterol (PROVENTIL HFA;VENTOLIN HFA) 108 (90 Base) MCG/ACT inhaler  Inhale 2 puffs into the lungs every 4 (four) hours as needed for wheezing or shortness of breath. 1 Inhaler 6  . albuterol (PROVENTIL) (2.5 MG/3ML) 0.083% nebulizer solution Take 3 mLs (2.5 mg total) by nebulization every 6 (six) hours as needed for wheezing or shortness of breath. 75 mL 12  . amLODipine (NORVASC) 2.5 MG tablet Take 1 tablet (2.5 mg total) by mouth daily. 90 tablet 3  . buPROPion (WELLBUTRIN XL) 150 MG 24 hr tablet Take 1 tablet (150 mg total) by mouth 2 (two) times daily. Start 150 mg ER PO qam, increase after 3 days to 150 mg ER PO BID. 60 tablet 3  . EPIPEN 2-PAK 0.3 MG/0.3ML SOAJ injection   11  . fexofenadine (ALLEGRA) 180 MG tablet Take 1 tablet (180 mg total) by mouth daily. 30 tablet 0  . fluticasone (VERAMYST) 27.5 MCG/SPRAY nasal spray Place 2 sprays into the nose daily. 10 g 12  . fluticasone furoate-vilanterol (BREO ELLIPTA) 200-25 MCG/INH AEPB Inhale 1 puff into the lungs daily. 60 each 5  . HYDROcodone-homatropine (HYCODAN) 5-1.5 MG/5ML syrup Take 5 mLs at bedtime as needed by mouth for cough. 30 mL 0  . INVOKANA 300 MG TABS tablet TAKE 1 TABLET BY MOUTH DAILY.  90 tablet 3  . levothyroxine (SYNTHROID, LEVOTHROID) 75 MCG tablet TAKE 1 TABLET BY MOUTH DAILY. 90 tablet 1  . meclizine (ANTIVERT) 32 MG tablet Take 1 tablet (32 mg total) by mouth 3 (three) times daily as needed for dizziness (vertigo). 30 tablet 0  . Mepolizumab (NUCALA) 100 MG SOLR Inject 100 mg into the skin every 30 (thirty) days. 1 each 5  . metFORMIN (GLUCOPHAGE XR) 500 MG 24 hr tablet Start 500mg  PO qpm. 90 tablet 3  . methylPREDNISolone (MEDROL DOSEPAK) 4 MG TBPK tablet Take 6 pills on day one then decrease by 1 pill each day 21 tablet 0  . montelukast (SINGULAIR) 10 MG tablet Take 1 tablet (10 mg total) by mouth at bedtime. 30 tablet 0  . naproxen (NAPROSYN) 500 MG tablet Take 1 tablet (500 mg total) by mouth 2 (two) times daily with a meal. 30 tablet 0  . pantoprazole (PROTONIX) 40 MG tablet TAKE 1  TABLET BY MOUTH DAILY. 30 tablet 5  . rosuvastatin (CRESTOR) 40 MG tablet Take 1 tablet (40 mg total) by mouth daily. 90 tablet 3  . sertraline (ZOLOFT) 100 MG tablet TAKE 1 TABLET (100 MG TOTAL) BY MOUTH AT BEDTIME. 90 tablet 1   Current Facility-Administered Medications on File Prior to Visit  Medication Dose Route Frequency Provider Last Rate Last Dose  . Mepolizumab SOLR 100 mg  100 mg Subcutaneous Q28 days Flora Lipps, MD   100 mg at 08/03/17 0923  . omalizumab Arvid Right) injection 300 mg  300 mg Subcutaneous Q14 Days Flora Lipps, MD   300 mg at 06/15/16 1209    Social History   Tobacco Use  . Smoking status: Never Smoker  . Smokeless tobacco: Never Used  Substance Use Topics  . Alcohol use: Yes    Alcohol/week: 0.0 oz  . Drug use: No    Review of Systems  Constitutional: Positive for fatigue (improved). Negative for chills and fever.  HENT: Positive for congestion and sinus pressure. Negative for sore throat and trouble swallowing.   Respiratory: Positive for cough and wheezing. Negative for shortness of breath.   Cardiovascular: Negative for chest pain and palpitations.  Gastrointestinal: Negative for nausea and vomiting.      Objective:    BP 138/82 (BP Location: Left Arm, Patient Position: Sitting, Cuff Size: Normal)   Pulse 74   Temp 98.7 F (37.1 C) (Oral)   Resp 15   Wt 166 lb 2 oz (75.4 kg)   SpO2 98%   BMI 30.38 kg/m  BP Readings from Last 3 Encounters:  02/13/18 138/82  10/19/17 (!) 174/89  08/20/17 128/76   Wt Readings from Last 3 Encounters:  02/13/18 166 lb 2 oz (75.4 kg)  10/19/17 150 lb (68 kg)  08/20/17 160 lb (72.6 kg)    Physical Exam  Constitutional: She appears well-developed and well-nourished.  HENT:  Head: Normocephalic and atraumatic.  Right Ear: Hearing, tympanic membrane, external ear and ear canal normal. No drainage, swelling or tenderness. No foreign bodies. Tympanic membrane is not erythematous and not bulging. No middle ear  effusion. No decreased hearing is noted.  Left Ear: Hearing, tympanic membrane, external ear and ear canal normal. No drainage, swelling or tenderness. No foreign bodies. Tympanic membrane is not erythematous and not bulging.  No middle ear effusion. No decreased hearing is noted.  Nose: Rhinorrhea present. Right sinus exhibits no maxillary sinus tenderness and no frontal sinus tenderness. Left sinus exhibits no maxillary sinus tenderness and no frontal sinus tenderness.  Mouth/Throat: Uvula is midline, oropharynx is clear and moist and mucous membranes are normal. No oropharyngeal exudate, posterior oropharyngeal edema, posterior oropharyngeal erythema or tonsillar abscesses.  Eyes: Conjunctivae are normal.  Cardiovascular: Regular rhythm, normal heart sounds and normal pulses.  Pulmonary/Chest: Effort normal and breath sounds normal. She has no wheezes. She has no rhonchi. She has no rales.  Lymphadenopathy:       Head (right side): No submental, no submandibular, no tonsillar, no preauricular, no posterior auricular and no occipital adenopathy present.       Head (left side): No submental, no submandibular, no tonsillar, no preauricular, no posterior auricular and no occipital adenopathy present.    She has no cervical adenopathy.  Neurological: She is alert.  Skin: Skin is warm and dry.  Psychiatric: She has a normal mood and affect. Her speech is normal and behavior is normal. Thought content normal.  Vitals reviewed.      Assessment & Plan:   Problem List Items Addressed This Visit      Cardiovascular and Mediastinum   HTN (hypertension)    At goal.  Continue current regimen.      Relevant Orders   Basic metabolic panel     Respiratory   Asthma, moderate persistent    Patient is well-appearing.  No acute respiratory distress.  Of note, this is the time of year that patient normally has asthma exacerbations as triggers are environmental allergens.  SaO2 98%.  Advised that she may  start prednisone taper only if she needs.  Patient will know how she is doing      Relevant Medications   predniSONE (DELTASONE) 10 MG tablet   Sinus congestion - Primary    Afebrile.  Based on duration of symptoms, reasonable to start antibiotic.  Patient let me know if she is not better.      Relevant Medications   doxycycline (VIBRA-TABS) 100 MG tablet     Endocrine   Diabetes mellitus type 2, controlled, without complications ([place])    Suspect controlled.  Pending A1c      Relevant Orders   Hemoglobin A1c   Microalbumin / creatinine urine ratio     Other   Screening mammogram, encounter for    Other Visit Diagnoses    Need for pneumococcal vaccination       Relevant Orders   Pneumococcal polysaccharide vaccine 23-valent greater than or equal to 2yo subcutaneous/IM (Completed)       I have discontinued Maeve E. Wisner's cetirizine, estradiol, doxycycline, clindamycin, and amoxicillin-clavulanate. I am also having her start on doxycycline and predniSONE. Additionally, I am having her maintain her fluticasone, albuterol, albuterol, metFORMIN, rosuvastatin, naproxen, Mepolizumab, fluticasone furoate-vilanterol, pantoprazole, buPROPion, EPIPEN 2-PAK, methylPREDNISolone, amLODipine, levothyroxine, INVOKANA, sertraline, HYDROcodone-homatropine, meclizine, montelukast, and fexofenadine. We will continue to administer omalizumab and Mepolizumab.   Meds ordered this encounter  Medications  . doxycycline (VIBRA-TABS) 100 MG tablet    Sig: Take 1 tablet (100 mg total) by mouth 2 (two) times daily.    Dispense:  10 tablet    Refill:  0    Order Specific Question:   Supervising Provider    Answer:   Deborra Medina L [[id number]]  . predniSONE (DELTASONE) 10 MG tablet    Sig: Take 4 tablets ( total 40 mg) by mouth for 2 days; take 3 tablets ( total 30 mg) by mouth for 2 days; take 2 tablets ( total 20 mg) by mouth for 1 day; take 1 tablet ( total 10 mg) by  mouth for 1 day.    Dispense:   17 tablet    Refill:  0    Order Specific Question:   Supervising Provider    Answer:   Crecencio Mc [[id number]]    Return precautions given.   Risks, benefits, and alternatives of the medications and treatment plan prescribed today were discussed, and patient expressed understanding.   Education regarding symptom management and diagnosis given to patient on AVS.  Continue to follow with Burnard Hawthorne, FNP for routine health maintenance.   Christyana E Rayman and I agreed with plan.   Mable Paris, FNP

## 2018-02-13 NOTE — Assessment & Plan Note (Signed)
Patient is well-appearing.  No acute respiratory distress.  Of note, this is the time of year that patient normally has asthma exacerbations as triggers are environmental allergens.  SaO2 98%.  Advised that she may start prednisone taper only if she needs.  Patient will know how she is doing

## 2018-02-18 ENCOUNTER — Ambulatory Visit: Payer: Self-pay | Admitting: Internal Medicine

## 2018-02-19 ENCOUNTER — Encounter: Payer: Self-pay | Admitting: Internal Medicine

## 2018-02-19 ENCOUNTER — Ambulatory Visit: Payer: 59 | Admitting: Internal Medicine

## 2018-02-19 VITALS — BP 122/78 | HR 82 | Ht 62.0 in | Wt 164.0 lb

## 2018-02-19 DIAGNOSIS — J4550 Severe persistent asthma, uncomplicated: Secondary | ICD-10-CM

## 2018-02-19 DIAGNOSIS — J0100 Acute maxillary sinusitis, unspecified: Secondary | ICD-10-CM

## 2018-02-19 DIAGNOSIS — Z8249 Family history of ischemic heart disease and other diseases of the circulatory system: Secondary | ICD-10-CM | POA: Diagnosis not present

## 2018-02-19 MED ORDER — PANTOPRAZOLE SODIUM 40 MG PO TBEC: 40.0000 mg | DELAYED_RELEASE_TABLET | Freq: Every day | ORAL | 5 refills | Status: DC

## 2018-02-19 MED ORDER — ALBUTEROL SULFATE HFA 108 (90 BASE) MCG/ACT IN AERS: 2.0000 | INHALATION_SPRAY | RESPIRATORY_TRACT | 6 refills | Status: DC | PRN

## 2018-02-19 MED ORDER — CETIRIZINE HCL 10 MG PO TABS: 10.0000 mg | ORAL_TABLET | Freq: Every day | ORAL | 6 refills | Status: DC

## 2018-02-19 MED ORDER — ALBUTEROL SULFATE (2.5 MG/3ML) 0.083% IN NEBU: 2.5000 mg | INHALATION_SOLUTION | Freq: Four times a day (QID) | RESPIRATORY_TRACT | 12 refills | Status: DC | PRN

## 2018-02-19 MED ORDER — LEVOFLOXACIN 500 MG PO TABS: 500.0000 mg | ORAL_TABLET | Freq: Every day | ORAL | 0 refills | Status: AC

## 2018-02-19 MED ORDER — FLUTICASONE FUROATE-VILANTEROL 200-25 MCG/INH IN AEPB: 1.0000 | INHALATION_SPRAY | Freq: Every day | RESPIRATORY_TRACT | 5 refills | Status: DC

## 2018-02-19 NOTE — Patient Instructions (Addendum)
Re-start Zyrtec Start Levaquin for 10 days Continue inhalers as prescribed Recommend Nucala therapy to continue Cardiology referral for CAD assessment

## 2018-02-19 NOTE — Progress Notes (Signed)
[facility] Consultation      Date: 02/19/2018,   MRN# [id number] Bridget Peters 1971/07/29    Admission                  Current  Bridget Peters is a 47 y.o. old female seen in consultation for asthma     CHIEF COMPLAINT:   Follow up [place]  Patient with persistent symptoms of asthma +SOB and wheezing but not in nay acute distress at this time  She takes BREO and Spiriva  Patient received Nucala infection(did NOT have adverse reaction) last October but did not receive call back to get follow up injections    Ige levels 320, Eos count 200 last OV  Patient had allergic reaction to Xolair on 06/13/16 had increased SOB, chest tightness and airway swelling Patient was sent to ER for observation    She NOT Doing well +signs of sinus infection at this time  Has been dx with mild OSA Patient has intermittent chest tightness most likley related to asthma but patient has DM and family history of CAD   IgE levels     MEDICATIONS    Home Medication:   Current Medication:  Current Outpatient Medications:  .  albuterol (PROVENTIL HFA;VENTOLIN HFA) 108 (90 Base) MCG/ACT inhaler, Inhale 2 puffs into the lungs every 4 (four) hours as needed for wheezing or shortness of breath., Disp: 1 Inhaler, Rfl: 6 .  albuterol (PROVENTIL) (2.5 MG/3ML) 0.083% nebulizer solution, Take 3 mLs (2.5 mg total) by nebulization every 6 (six) hours as needed for wheezing or shortness of breath., Disp: 75 mL, Rfl: 12 .  amLODipine (NORVASC) 2.5 MG tablet, Take 1 tablet (2.5 mg total) by mouth daily., Disp: 90 tablet, Rfl: 3 .  buPROPion (WELLBUTRIN XL) 150 MG 24 hr tablet, Take 1 tablet (150 mg total) by mouth 2 (two) times daily. Start 150 mg ER PO qam, increase after 3 days to 150 mg ER PO BID., Disp: 60 tablet, Rfl: 3 .  doxycycline (VIBRA-TABS) 100 MG tablet, Take 1 tablet (100 mg total) by mouth 2 (two) times daily., Disp: 10 tablet, Rfl:  0 .  EPIPEN 2-PAK 0.3 MG/0.3ML SOAJ injection, , Disp: , Rfl: 11 .  fexofenadine (ALLEGRA) 180 MG tablet, Take 1 tablet (180 mg total) by mouth daily., Disp: 30 tablet, Rfl: 0 .  fluticasone (VERAMYST) 27.5 MCG/SPRAY nasal spray, Place 2 sprays into the nose daily., Disp: 10 g, Rfl: 12 .  fluticasone furoate-vilanterol (BREO ELLIPTA) 200-25 MCG/INH AEPB, Inhale 1 puff into the lungs daily., Disp: 60 each, Rfl: 5 .  HYDROcodone-homatropine (HYCODAN) 5-1.5 MG/5ML syrup, Take 5 mLs at bedtime as needed by mouth for cough., Disp: 30 mL, Rfl: 0 .  INVOKANA 300 MG TABS tablet, TAKE 1 TABLET BY MOUTH DAILY., Disp: 90 tablet, Rfl: 3 .  levothyroxine (SYNTHROID, LEVOTHROID) 75 MCG tablet, TAKE 1 TABLET BY MOUTH DAILY., Disp: 90 tablet, Rfl: 1 .  meclizine (ANTIVERT) 32 MG tablet, Take 1 tablet (32 mg total) by mouth 3 (three) times daily as needed for dizziness (vertigo)., Disp: 30 tablet, Rfl: 0 .  Mepolizumab (NUCALA) 100 MG SOLR, Inject 100 mg into the skin every 30 (thirty) days., Disp: 1 each, Rfl: 5 .  metFORMIN (GLUCOPHAGE XR) 500 MG 24 hr tablet, Start 500mg  PO qpm., Disp: 90 tablet, Rfl: 3 .  methylPREDNISolone (MEDROL DOSEPAK) 4 MG TBPK tablet, Take 6 pills on day one then decrease by 1  pill each day, Disp: 21 tablet, Rfl: 0 .  montelukast (SINGULAIR) 10 MG tablet, Take 1 tablet (10 mg total) by mouth at bedtime., Disp: 30 tablet, Rfl: 0 .  naproxen (NAPROSYN) 500 MG tablet, Take 1 tablet (500 mg total) by mouth 2 (two) times daily with a meal., Disp: 30 tablet, Rfl: 0 .  pantoprazole (PROTONIX) 40 MG tablet, TAKE 1 TABLET BY MOUTH DAILY., Disp: 30 tablet, Rfl: 5 .  predniSONE (DELTASONE) 10 MG tablet, Take 4 tablets ( total 40 mg) by mouth for 2 days; take 3 tablets ( total 30 mg) by mouth for 2 days; take 2 tablets ( total 20 mg) by mouth for 1 day; take 1 tablet ( total 10 mg) by mouth for 1 day., Disp: 17 tablet, Rfl: 0 .  rosuvastatin (CRESTOR) 40 MG tablet, Take 1 tablet (40 mg total) by mouth  daily., Disp: 90 tablet, Rfl: 3 .  sertraline (ZOLOFT) 100 MG tablet, TAKE 1 TABLET (100 MG TOTAL) BY MOUTH AT BEDTIME., Disp: 90 tablet, Rfl: 1  Current Facility-Administered Medications:  Marland Kitchen  Mepolizumab SOLR 100 mg, 100 mg, Subcutaneous, Q28 days, Mortimer Fries, [place] Agent, MD, 100 mg at 08/03/17 0923 .  omalizumab Arvid Right) injection 300 mg, 300 mg, Subcutaneous, Q14 Days, Shalla Bulluck, MD, 300 mg at 06/15/16 1209    ALLERGIES   Milk-related compounds; Shellfish allergy; Wheat bran; Xolair [omalizumab]; and Isovue [iopamidol]     REVIEW OF SYSTEMS   Review of Systems  Constitutional: Negative for chills, fever, malaise/fatigue and weight loss.  HENT: Negative for congestion.   Respiratory: Positive for cough, shortness of breath and wheezing. Negative for hemoptysis and sputum production.   Cardiovascular: Negative for chest pain.  Gastrointestinal: Negative for heartburn, nausea and vomiting.  Endo/Heme/Allergies: Negative.   All other systems reviewed and are negative.   BP 122/78 (BP Location: Left Arm, Cuff Size: Normal)   Pulse 82   Ht 5\' 2"  (1.575 m)   Wt 164 lb (74.4 kg)   SpO2 99%   BMI 30.00 kg/m    PHYSICAL EXAM  Physical Exam  Constitutional: She is oriented to person, place, and time. She appears well-developed and well-nourished. No distress.  HENT:  Mouth/Throat: No oropharyngeal exudate.  +RT sinus tenderness with palpation  Neck: Neck supple.  Cardiovascular: Normal rate, regular rhythm and normal heart sounds.  No murmur heard. Pulmonary/Chest: Effort normal and breath sounds normal. No stridor. No respiratory distress. She has no wheezes.  Musculoskeletal: Normal range of motion.  Neurological: She is alert and oriented to person, place, and time. No cranial nerve deficit.  Skin: Skin is warm. She is not diaphoretic.  Psychiatric: She has a normal mood and affect.     IgE levels=320 Eosinophil count=200  IgE     ASSESSMENT/PLAN   47 yo white female  seen today for severe chronic persistent asthma without exacerbation at this time. Patient with signs of sinus infection with h/o atypical chest pain with underlying DM and family history of CAD    With elevated IgE levels and elevated EOS count, with several ASTHMA exacerbations in the past 6 months. Patient meets criteria for Immnuotherapy However, patient had sever reaction to Xolair injections, and despite this reaction, patient has decided to try alternative Immunotherapy and has received one injection of Nucala in 07/2017  1.chronic severe persistent ASTHMA Patient needs to continue inhalers as prescribed ALB NEB and MDI Continue BREO 200 Continue Spiriva avoid all triggers Will need to continue Nucala therapy-patient has chosen [facility]  allergy   2.Alerrghic rhinitis -start Zyrtec Continue flonase and astelin nasal sprays  3.acute sinus infection  levaquin 500 mg daily for 10 days  4.atypcail chest pain with DM and family hsitory Recommend cardiac referal for risk assessment for CAD   Follow up in 3 months   Patient satisfied with Plan of action and management. All questions answered  [name], M.D.  [facility]  Medical Director [place] Director [facility] Cardio-Pulmonary Department

## 2018-02-20 ENCOUNTER — Encounter: Payer: Self-pay | Admitting: Family

## 2018-02-22 ENCOUNTER — Other Ambulatory Visit: Payer: Self-pay | Admitting: Family

## 2018-02-22 DIAGNOSIS — E119 Type 2 diabetes mellitus without complications: Secondary | ICD-10-CM

## 2018-02-22 MED ORDER — METFORMIN HCL ER (MOD) 1000 MG PO TB24: 1000.0000 mg | ORAL_TABLET | Freq: Every day | ORAL | 1 refills | Status: DC

## 2018-02-22 NOTE — Progress Notes (Signed)
close

## 2018-02-25 ENCOUNTER — Telehealth: Payer: Self-pay

## 2018-02-25 NOTE — Telephone Encounter (Signed)
That is fine 

## 2018-02-25 NOTE — Telephone Encounter (Addendum)
[facility] states Metformin ER 1000 OSM is not preferred .  Can pharmacy  change it to Metformin ER 500mg  2 tablets

## 2018-02-25 NOTE — Telephone Encounter (Signed)
Pharmacy advised of below .

## 2018-04-02 ENCOUNTER — Ambulatory Visit (INDEPENDENT_AMBULATORY_CARE_PROVIDER_SITE_OTHER): Payer: Self-pay | Admitting: Family Medicine

## 2018-04-02 ENCOUNTER — Encounter: Payer: Self-pay | Admitting: Family

## 2018-04-02 ENCOUNTER — Encounter: Payer: Self-pay | Admitting: Family Medicine

## 2018-04-02 VITALS — BP 158/98 | HR 81 | Temp 98.5°F | Wt 166.0 lb

## 2018-04-02 DIAGNOSIS — H6981 Other specified disorders of Eustachian tube, right ear: Secondary | ICD-10-CM

## 2018-04-02 DIAGNOSIS — R51 Headache: Secondary | ICD-10-CM

## 2018-04-02 DIAGNOSIS — R519 Headache, unspecified: Secondary | ICD-10-CM

## 2018-04-02 DIAGNOSIS — J320 Chronic maxillary sinusitis: Secondary | ICD-10-CM

## 2018-04-02 MED ORDER — AMOXICILLIN-POT CLAVULANATE 875-125 MG PO TABS: 1.0000 | ORAL_TABLET | Freq: Two times a day (BID) | ORAL | 0 refills | Status: DC

## 2018-04-02 NOTE — Telephone Encounter (Signed)
I spoke with patient and offered her appointment with Almyra Free , NP on Thursday and she is unable to come in due to work conflict.  Please advise.

## 2018-04-02 NOTE — Patient Instructions (Signed)
I will trial you on an antibiotic as I suspect your symptoms are related to on-going chronic sinusitis. I recommend that you schedule a follow-up with your PCP to be re-evaluated once you complete antibiotic or if symptoms worsen.      Eustachian Tube Dysfunction The eustachian tube connects the middle ear to the back of the nose. It regulates air pressure in the middle ear by allowing air to move between the ear and nose. It also helps to drain fluid from the middle ear space. When the eustachian tube does not function properly, air pressure, fluid, or both can build up in the middle ear. Eustachian tube dysfunction can affect one or both ears. What are the causes? This condition happens when the eustachian tube becomes blocked or cannot open normally. This may result from:  Ear infections.  Colds and other upper respiratory infections.  Allergies.  Irritation, such as from cigarette smoke or acid from the stomach coming up into the esophagus (gastroesophageal reflux).  Sudden changes in air pressure, such as from descending in an airplane.  Abnormal growths in the nose or throat, such as nasal polyps, tumors, or enlarged tissue at the back of the throat (adenoids).  What increases the risk? This condition may be more likely to develop in people who smoke and people who are overweight. Eustachian tube dysfunction may also be more likely to develop in children, especially children who have:  Certain birth defects of the mouth, such as cleft palate.  Large tonsils and adenoids.  What are the signs or symptoms? Symptoms of this condition may include:  A feeling of fullness in the ear.  Ear pain.  Clicking or popping noises in the ear.  Ringing in the ear.  Hearing loss.  Loss of balance.  Symptoms may get worse when the air pressure around you changes, such as when you travel to an area of high elevation or fly on an airplane. How is this diagnosed? This condition may be  diagnosed based on:  Your symptoms.  A physical exam of your ear, nose, and throat.  Tests, such as those that measure: ? The movement of your eardrum (tympanogram). ? Your hearing (audiometry).  How is this treated? Treatment depends on the cause and severity of your condition. If your symptoms are mild, you may be able to relieve your symptoms by moving air into ("popping") your ears. If you have symptoms of fluid in your ears, treatment may include:  Decongestants.  Antihistamines.  Nasal sprays or ear drops that contain medicines that reduce swelling (steroids).  In some cases, you may need to have a procedure to drain the fluid in your eardrum (myringotomy). In this procedure, a small tube is placed in the eardrum to:  Drain the fluid.  Restore the air in the middle ear space.  Follow these instructions at home:  Take over-the-counter and prescription medicines only as told by your health care provider.  Use techniques to help pop your ears as recommended by your health care provider. These may include: ? Chewing gum. ? Yawning. ? Frequent, forceful swallowing. ? Closing your mouth, holding your nose closed, and gently blowing as if you are trying to blow air out of your nose.  Do not do any of the following until your health care provider approves: ? Travel to high altitudes. ? Fly in airplanes. ? Work in a Pension scheme manager or room. ? Scuba dive.  Keep your ears dry. Dry your ears completely after showering or bathing.  Do not smoke.  Keep all follow-up visits as told by your health care provider. This is important. Contact a health care provider if:  Your symptoms do not go away after treatment.  Your symptoms come back after treatment.  You are unable to pop your ears.  You have: ? A fever. ? Pain in your ear. ? Pain in your head or neck. ? Fluid draining from your ear.  Your hearing suddenly changes.  You become very dizzy.  You lose your  balance. This information is not intended to replace advice given to you by your health care provider. Make sure you discuss any questions you have with your health care provider. Document Released: 10/22/2015 Document Revised: 03/02/2016 Document Reviewed: 10/14/2014 Elsevier Interactive Patient Education  [employer].

## 2018-04-02 NOTE — Progress Notes (Signed)
Patient ID: Bridget Peters, female    DOB: 06/14/1971, 47 y.o.   MRN: [id number]  PCP: Burnard Hawthorne, FNP  Chief Complaint  Patient presents with  . choice-ear pain/tongue swellinmg    Subjective:  HPI Bridget Peters is a 47 y.o. female presents for evaluation facial pain, right ea  Medical history significant for type 2 diabetes, hypertension, chronic sinusitis, and asthma.  Roda reports yesterday experiencing worsening right sided facial pain and right ear pain radiating down the lateral right side of her neck. Reports associated upper right sided teeth pain.  She also reports the sensation of right lateral tongue swelling. She is not experiencing difficulty speaking although her tongue feels heavy. She has not started any new medications, denies SOB or asthma -like symptoms. She is currently prescribed multiple chronic medications including sinus and asthma medication which has not improved current symptoms. Social History   Socioeconomic History  . Marital status: Married    Spouse name: Not on file  . Number of children: Not on file  . Years of education: Not on file  . Highest education level: Not on file  Occupational History  . Not on file  Social Needs  . Financial resource strain: Not on file  . Food insecurity:    Worry: Not on file    Inability: Not on file  . Transportation needs:    Medical: Not on file    Non-medical: Not on file  Tobacco Use  . Smoking status: Never Smoker  . Smokeless tobacco: Never Used  Substance and Sexual Activity  . Alcohol use: Yes    Alcohol/week: 0.0 oz  . Drug use: No  . Sexual activity: Never    Partners: Male    Birth control/protection: Post-menopausal  Lifestyle  . Physical activity:    Days per week: Not on file    Minutes per session: Not on file  . Stress: Not on file  Relationships  . Social connections:    Talks on phone: Not on file    Gets together: Not on file    Attends religious service: Not  on file    Active member of club or organization: Not on file    Attends meetings of clubs or organizations: Not on file    Relationship status: Not on file  . Intimate partner violence:    Fear of current or ex partner: Not on file    Emotionally abused: Not on file    Physically abused: Not on file    Forced sexual activity: Not on file  Other Topics Concern  . Not on file  Social History Narrative   Married   Garment/textile technologist    Children- None, 2 step-sons    1 [place] terriers    Caffeine- soda 2-3 cans, no coffee/tea   Moved 03/2015 from [place], [place]. [employer])    Family History  Problem Relation Age of Onset  . Hypertension Mother   . Endometrial cancer Mother 19  . Hypertension Father   . Colon polyps Father   . Cancer Maternal Aunt        Breast Cancer  . Breast cancer Maternal Aunt   . Breast cancer Paternal Aunt        Breast Cancer  . Cancer Maternal Grandmother 69       Bladder Cancer  . Stroke Maternal Grandmother   . Breast cancer Maternal Grandmother 60  . Cancer Maternal Grandfather 20  Esophagus Cancer  . Colon cancer Neg Hx   . Ovarian cancer Neg Hx    Review of Systems Pertinent negatives listed in HPI  Patient Active Problem List   Diagnosis Date Noted  . Sinus congestion 02/13/2018  . Bronchitis 08/20/2017  . Osteopenia 05/14/2017  . Rash 05/14/2017  . OSA (obstructive sleep apnea) 04/24/2017  . Elevated hemoglobin ([place]) 03/09/2017  . Fatty liver 01/17/2017  . Abdominal right lower quadrant tenderness 01/10/2017  . Fatigue 01/01/2017  . Right thigh pain 01/01/2017  . HTN (hypertension) 01/01/2017  . 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 Depression 07/11/2016  . Allergic reaction 06/13/2016  . Hypercholesteremia 04/26/2016  . Screening mammogram, encounter for 07/14/2015  . Hypothyroidism 07/14/2015  . Diabetes mellitus type 2, controlled, without complications ([place]) 20/25/4270  . Multiple environmental  allergies 07/14/2015  . Asthma, moderate persistent 07/14/2015    Allergies  Allergen Reactions  . Milk-Related Compounds Hives  . Shellfish Allergy Hives    Shortness of breath  . Wheat Bran Hives  . Xolair [Omalizumab]     Itching  . Isovue [Iopamidol] Hives    Pt developed hives approximately 6 hours after receiving Isovue 300 for CT Scan. She was given 50mG  oral Benadryl and symptoms improved.     Prior to Admission medications   Medication Sig Start Date End Date Taking? Authorizing Provider  albuterol (PROVENTIL HFA;VENTOLIN HFA) 108 (90 Base) MCG/ACT inhaler Inhale 2 puffs into the lungs every 4 (four) hours as needed for wheezing or shortness of breath. 02/19/18  Yes Flora Lipps, MD  albuterol (PROVENTIL) (2.5 MG/3ML) 0.083% nebulizer solution Take 3 mLs (2.5 mg total) by nebulization every 6 (six) hours as needed for wheezing or shortness of breath. 02/19/18  Yes Kasa, Maretta Bees, MD  buPROPion (WELLBUTRIN XL) 150 MG 24 hr tablet Take 1 tablet (150 mg total) by mouth 2 (two) times daily. Start 150 mg ER PO qam, increase after 3 days to 150 mg ER PO BID. 04/24/17  Yes Arnett, Yvetta Coder, FNP  cetirizine (ZYRTEC ALLERGY) 10 MG tablet Take 1 tablet (10 mg total) by mouth daily. 02/19/18  Yes Flora Lipps, MD  EPIPEN 2-PAK 0.3 MG/0.3ML SOAJ injection  05/04/17  Yes [provider]  fexofenadine (ALLEGRA) 180 MG tablet Take 1 tablet (180 mg total) by mouth daily. 12/18/17  Yes Kasa, Maretta Bees, MD  fluticasone (VERAMYST) 27.5 MCG/SPRAY nasal spray Place 2 sprays into the nose daily. 08/06/15  Yes Kasa, Maretta Bees, MD  fluticasone furoate-vilanterol (BREO ELLIPTA) 200-25 MCG/INH AEPB Inhale 1 puff into the lungs daily. 02/19/18  Yes Kasa, Maretta Bees, MD  INVOKANA 300 MG TABS tablet TAKE 1 TABLET BY MOUTH DAILY. 08/13/17  Yes Arnett, Yvetta Coder, FNP  levothyroxine (SYNTHROID, LEVOTHROID) 75 MCG tablet TAKE 1 TABLET BY MOUTH DAILY. 07/16/17  Yes Burnard Hawthorne, FNP  pantoprazole (PROTONIX) 40 MG  tablet Take 1 tablet (40 mg total) by mouth daily. 02/19/18  Yes Flora Lipps, MD  sertraline (ZOLOFT) 100 MG tablet TAKE 1 TABLET (100 MG TOTAL) BY MOUTH AT BEDTIME. 08/13/17  Yes Arnett, Yvetta Coder, FNP  SPIRIVA RESPIMAT 1.25 MCG/ACT AERS Inhale 1 puff into the lungs daily. 01/01/18  Yes [provider]  amLODipine (NORVASC) 2.5 MG tablet Take 1 tablet (2.5 mg total) by mouth daily. Patient not taking: Reported on 04/02/2018 05/14/17   Burnard Hawthorne, FNP  meclizine (ANTIVERT) 32 MG tablet Take 1 tablet (32 mg total) by mouth 3 (three) times daily as needed for dizziness (vertigo). Patient not  taking: Reported on 04/02/2018 10/19/17   Claiborne Billings, PA-C  Mepolizumab (NUCALA) 100 MG SOLR Inject 100 mg into the skin every 30 (thirty) days. Patient not taking: Reported on 02/19/2018 10/20/16   Flora Lipps, MD  metFORMIN (GLUCOPHAGE-XR) 500 MG 24 hr tablet  02/25/18   [provider]  montelukast (SINGULAIR) 10 MG tablet Take 1 tablet (10 mg total) by mouth at bedtime. Patient not taking: Reported on 04/02/2018 12/18/17   Flora Lipps, MD  naproxen (NAPROSYN) 500 MG tablet Take 1 tablet (500 mg total) by mouth 2 (two) times daily with a meal. Patient not taking: Reported on 04/02/2018 09/04/16   Sharion Balloon, FNP  rosuvastatin (CRESTOR) 40 MG tablet Take 1 tablet (40 mg total) by mouth daily. Patient not taking: Reported on 04/02/2018 04/25/16   Burnard Hawthorne, FNP    Past Medical, Surgical Family and Social History reviewed and updated.    Objective:   Today's Vitals   04/02/18 0909  BP: (!) 160/100  Pulse: 81  Temp: 98.5 F (36.9 C)  SpO2: 98%  Weight: 166 lb (75.3 kg)  PainSc: 8     Wt Readings from Last 3 Encounters:  04/02/18 166 lb (75.3 kg)  02/19/18 164 lb (74.4 kg)  02/13/18 166 lb 2 oz (75.4 kg)    Physical Exam  Constitutional: She is oriented to person, place, and time. She appears well-developed. She appears ill.  HENT:  Head: Normocephalic  and atraumatic.  Right Ear: External ear normal.  Left Ear: External ear normal.  Nose: Mucosal edema and rhinorrhea present. Right sinus exhibits maxillary sinus tenderness.  Mouth/Throat: Posterior oropharyngeal erythema present.  Eyes: Pupils are equal, round, and reactive to light. Conjunctivae and EOM are normal.  Cardiovascular: Normal rate, regular rhythm and normal heart sounds.  Pulmonary/Chest: Effort normal and breath sounds normal.  Musculoskeletal: Normal range of motion.  Neurological: She is alert and oriented to person, place, and time.  Skin: Skin is warm and dry.  Psychiatric: She has a normal mood and affect. Her behavior is normal. Judgment and thought content normal.   Assessment & Plan:  1. Eustachian tube dysfunction, right 2. Right facial pain 3. Chronic maxillary sinusitis  Patient suffers from type 2 diabetes and has chronically taken prednisone in the past for asthma therefore I will defer prednisone at this time for treatment of suspected eustachian tube. I suspect all symptoms are secondary to underlying chronic sinusitis therefore I will trial antibiotic therapy and recommend follow-up with PCP as this is a chronic problem which may warrant an ENT referral.    If symptoms worsen or do not improve, return for follow-up, follow-up with PCP, or at the emergency department if severity of symptoms warrant a higher level of care.     Carroll Sage. Kenton Kingfisher, MSN, FNP-C [facility]  [address], [place] [address] [phone]

## 2018-04-03 ENCOUNTER — Telehealth: Payer: Self-pay

## 2018-04-03 NOTE — Telephone Encounter (Signed)
Patient called requesting a early appt to be seen for Eustachian tube infection. Patient was seen at [facility] in [place] on 04/02/2018. Patient contacted her PCP Mable Paris for an appt. She was contacted by her PCP and was advise per her message to see someone regarding this manner. Patient stated she was going to have Dr. Army Melia be her PCP and will need an appt to see her. Please advise if patient needs to be seen soon and/or she needs to set up an appt in the near future.

## 2018-04-03 NOTE — Telephone Encounter (Signed)
Patient has appt with Delano Metz 04/04/2018

## 2018-04-03 NOTE — Telephone Encounter (Signed)
I don't have any new pt appointments for a while.  She needs to see her PCP or go back to [facility].

## 2018-04-04 ENCOUNTER — Encounter: Payer: Self-pay | Admitting: Family Medicine

## 2018-04-04 ENCOUNTER — Ambulatory Visit: Payer: 59 | Admitting: Family Medicine

## 2018-04-04 VITALS — BP 150/86 | HR 87 | Temp 98.0°F | Resp 16 | Wt 165.5 lb

## 2018-04-04 DIAGNOSIS — J309 Allergic rhinitis, unspecified: Secondary | ICD-10-CM

## 2018-04-04 DIAGNOSIS — H6991 Unspecified Eustachian tube disorder, right ear: Secondary | ICD-10-CM

## 2018-04-04 DIAGNOSIS — H6981 Other specified disorders of Eustachian tube, right ear: Secondary | ICD-10-CM | POA: Diagnosis not present

## 2018-04-04 NOTE — Patient Instructions (Signed)
It was a pleasure meeting you today. Please take antibiotic that was previously prescribed and finish entire dose. Continue with current medications. If symptoms do not improve with treatment, worsen, or new symptoms develop, follow up for further evaluation.    Eustachian Tube Dysfunction The eustachian tube connects the middle ear to the back of the nose. It regulates air pressure in the middle ear by allowing air to move between the ear and nose. It also helps to drain fluid from the middle ear space. When the eustachian tube does not function properly, air pressure, fluid, or both can build up in the middle ear. Eustachian tube dysfunction can affect one or both ears. What are the causes? This condition happens when the eustachian tube becomes blocked or cannot open normally. This may result from:  Ear infections.  Colds and other upper respiratory infections.  Allergies.  Irritation, such as from cigarette smoke or acid from the stomach coming up into the esophagus (gastroesophageal reflux).  Sudden changes in air pressure, such as from descending in an airplane.  Abnormal growths in the nose or throat, such as nasal polyps, tumors, or enlarged tissue at the back of the throat (adenoids).  What increases the risk? This condition may be more likely to develop in people who smoke and people who are overweight. Eustachian tube dysfunction may also be more likely to develop in children, especially children who have:  Certain birth defects of the mouth, such as cleft palate.  Large tonsils and adenoids.  What are the signs or symptoms? Symptoms of this condition may include:  A feeling of fullness in the ear.  Ear pain.  Clicking or popping noises in the ear.  Ringing in the ear.  Hearing loss.  Loss of balance.  Symptoms may get worse when the air pressure around you changes, such as when you travel to an area of high elevation or fly on an airplane. How is this  diagnosed? This condition may be diagnosed based on:  Your symptoms.  A physical exam of your ear, nose, and throat.  Tests, such as those that measure: ? The movement of your eardrum (tympanogram). ? Your hearing (audiometry).  How is this treated? Treatment depends on the cause and severity of your condition. If your symptoms are mild, you may be able to relieve your symptoms by moving air into ("popping") your ears. If you have symptoms of fluid in your ears, treatment may include:  Decongestants.  Antihistamines.  Nasal sprays or ear drops that contain medicines that reduce swelling (steroids).  In some cases, you may need to have a procedure to drain the fluid in your eardrum (myringotomy). In this procedure, a small tube is placed in the eardrum to:  Drain the fluid.  Restore the air in the middle ear space.  Follow these instructions at home:  Take over-the-counter and prescription medicines only as told by your health care provider.  Use techniques to help pop your ears as recommended by your health care provider. These may include: ? Chewing gum. ? Yawning. ? Frequent, forceful swallowing. ? Closing your mouth, holding your nose closed, and gently blowing as if you are trying to blow air out of your nose.  Do not do any of the following until your health care provider approves: ? Travel to high altitudes. ? Fly in airplanes. ? Work in a Pension scheme manager or room. ? Scuba dive.  Keep your ears dry. Dry your ears completely after showering or bathing.  Do  not smoke.  Keep all follow-up visits as told by your health care provider. This is important. Contact a health care provider if:  Your symptoms do not go away after treatment.  Your symptoms come back after treatment.  You are unable to pop your ears.  You have: ? A fever. ? Pain in your ear. ? Pain in your head or neck. ? Fluid draining from your ear.  Your hearing suddenly changes.  You become  very dizzy.  You lose your balance. This information is not intended to replace advice given to you by your health care provider. Make sure you discuss any questions you have with your health care provider. Document Released: 10/22/2015 Document Revised: 03/02/2016 Document Reviewed: 10/14/2014 Elsevier Interactive Patient Education  [employer].

## 2018-04-04 NOTE — Telephone Encounter (Signed)
Appointment today with Almyra Free, NP

## 2018-04-04 NOTE — Progress Notes (Signed)
Subjective:    Patient ID: Bridget Peters, female    DOB: July 29, 1971, 47 y.o.   MRN: [id number]  HPI  Bridget Peters is a 47 year old female who presents today with right ear pain and pressure that started 6 days ago.  Associated mild sore throat with post nasal drip which has improved, rhinitis, nasal congestion, itchy/watery eyes, and mild sinus pressure are present. She states that she her voice sounds like she is in a tunnel when speaking. She was evaluated two days ago at an employee clinic and was provided Augmentin and prednisone for symptoms of ear pain. She has started Augmentin and has taken 4 tablets without adverse effects. She reports that pain in her right ear has decreased but has not resolved. History of asthma and allergies and she is followed by pulmonology. She is adherent with Allegra, Singulair, Spiriva, and Breo.  She also uses fluticasone nasal spray. Symptoms of post nasal drip, rhinitis, nasal congestion, watery eyes are noted mainly in the morning and improve during the day when taking medications for allergy symptoms.  She has not tried prednisone for ear pain as she reports being prednisone resistant.  History of HTN, asthma, OSA, and T2DM controlled  She is not a smoker No recent sick contact exposure.  Review of Systems  Constitutional: Negative for chills, fatigue and fever.  HENT: Positive for congestion, ear pain, postnasal drip, rhinorrhea and sinus pressure. Negative for tinnitus.   Eyes: Positive for itching.  Respiratory: Negative for cough, shortness of breath and wheezing.   Cardiovascular: Negative for chest pain and palpitations.  Gastrointestinal: Negative for abdominal pain, nausea and vomiting.  Skin: Negative for rash.  Neurological: Negative for dizziness, weakness and headaches.   Past Medical History:  Diagnosis Date  . Allergy    Seasonal  . Asthma   . Depression   . Diabetes mellitus without complication ([place])    Pt takes  Metformin.  Marland Kitchen Hypertension   . Pneumonia   . Shortness of breath   . Sleep apnea   . Thyroid disease      Social History   Socioeconomic History  . Marital status: Married    Spouse name: Not on file  . Number of children: Not on file  . Years of education: Not on file  . Highest education level: Not on file  Occupational History  . Not on file  Social Needs  . Financial resource strain: Not on file  . Food insecurity:    Worry: Not on file    Inability: Not on file  . Transportation needs:    Medical: Not on file    Non-medical: Not on file  Tobacco Use  . Smoking status: Never Smoker  . Smokeless tobacco: Never Used  Substance and Sexual Activity  . Alcohol use: Yes    Alcohol/week: 0.0 oz  . Drug use: No  . Sexual activity: Never    Partners: Male    Birth control/protection: Post-menopausal  Lifestyle  . Physical activity:    Days per week: Not on file    Minutes per session: Not on file  . Stress: Not on file  Relationships  . Social connections:    Talks on phone: Not on file    Gets together: Not on file    Attends religious service: Not on file    Active member of club or organization: Not on file    Attends meetings of clubs or organizations: Not on file  Relationship status: Not on file  . Intimate partner violence:    Fear of current or ex partner: Not on file    Emotionally abused: Not on file    Physically abused: Not on file    Forced sexual activity: Not on file  Other Topics Concern  . Not on file  Social History Narrative   Married   Garment/textile technologist    Children- None, 2 step-sons    1 boston terriers    Caffeine- soda 2-3 cans, no coffee/tea   Moved 03/2015 from [place], [place]. [employer])    Past Surgical History:  Procedure Laterality Date  . BREAST SURGERY  01/2013   Breast Reduction   . NASAL SINUS SURGERY    . NASAL SINUS SURGERY  02/2014  . REDUCTION MAMMAPLASTY Bilateral 2014    Family History    Problem Relation Age of Onset  . Hypertension Mother   . Endometrial cancer Mother 57  . Hypertension Father   . Colon polyps Father   . Cancer Maternal Aunt        Breast Cancer  . Breast cancer Maternal Aunt   . Breast cancer Paternal Aunt        Breast Cancer  . Cancer Maternal Grandmother 31       Bladder Cancer  . Stroke Maternal Grandmother   . Breast cancer Maternal Grandmother 60  . Cancer Maternal Grandfather 82       Esophagus Cancer  . Colon cancer Neg Hx   . Ovarian cancer Neg Hx     Allergies  Allergen Reactions  . Milk-Related Compounds Hives  . Shellfish Allergy Hives    Shortness of breath  . Wheat Bran Hives  . Xolair [Omalizumab]     Itching  . Isovue [Iopamidol] Hives    Pt developed hives approximately 6 hours after receiving Isovue 300 for CT Scan. She was given 50mG  oral Benadryl and symptoms improved.     Current Outpatient Medications on File Prior to Visit  Medication Sig Dispense Refill  . albuterol (PROVENTIL HFA;VENTOLIN HFA) 108 (90 Base) MCG/ACT inhaler Inhale 2 puffs into the lungs every 4 (four) hours as needed for wheezing or shortness of breath. 1 Inhaler 6  . albuterol (PROVENTIL) (2.5 MG/3ML) 0.083% nebulizer solution Take 3 mLs (2.5 mg total) by nebulization every 6 (six) hours as needed for wheezing or shortness of breath. 75 mL 12  . amLODipine (NORVASC) 2.5 MG tablet Take 1 tablet (2.5 mg total) by mouth daily. 90 tablet 3  . amoxicillin-clavulanate (AUGMENTIN) 875-125 MG tablet Take 1 tablet by mouth 2 (two) times daily. 20 tablet 0  . buPROPion (WELLBUTRIN XL) 150 MG 24 hr tablet Take 1 tablet (150 mg total) by mouth 2 (two) times daily. Start 150 mg ER PO qam, increase after 3 days to 150 mg ER PO BID. 60 tablet 3  . cetirizine (ZYRTEC ALLERGY) 10 MG tablet Take 1 tablet (10 mg total) by mouth daily. 30 tablet 6  . EPIPEN 2-PAK 0.3 MG/0.3ML SOAJ injection   11  . fexofenadine (ALLEGRA) 180 MG tablet Take 1 tablet (180 mg total) by  mouth daily. 30 tablet 0  . fluticasone (VERAMYST) 27.5 MCG/SPRAY nasal spray Place 2 sprays into the nose daily. 10 g 12  . fluticasone furoate-vilanterol (BREO ELLIPTA) 200-25 MCG/INH AEPB Inhale 1 puff into the lungs daily. 60 each 5  . INVOKANA 300 MG TABS tablet TAKE 1 TABLET BY MOUTH DAILY. 90 tablet 3  .  levothyroxine (SYNTHROID, LEVOTHROID) 75 MCG tablet TAKE 1 TABLET BY MOUTH DAILY. 90 tablet 1  . meclizine (ANTIVERT) 32 MG tablet Take 1 tablet (32 mg total) by mouth 3 (three) times daily as needed for dizziness (vertigo). 30 tablet 0  . Mepolizumab (NUCALA) 100 MG SOLR Inject 100 mg into the skin every 30 (thirty) days. 1 each 5  . metFORMIN (GLUCOPHAGE-XR) 500 MG 24 hr tablet   1  . montelukast (SINGULAIR) 10 MG tablet Take 1 tablet (10 mg total) by mouth at bedtime. 30 tablet 0  . naproxen (NAPROSYN) 500 MG tablet Take 1 tablet (500 mg total) by mouth 2 (two) times daily with a meal. 30 tablet 0  . pantoprazole (PROTONIX) 40 MG tablet Take 1 tablet (40 mg total) by mouth daily. 30 tablet 5  . rosuvastatin (CRESTOR) 40 MG tablet Take 1 tablet (40 mg total) by mouth daily. 90 tablet 3  . sertraline (ZOLOFT) 100 MG tablet TAKE 1 TABLET (100 MG TOTAL) BY MOUTH AT BEDTIME. 90 tablet 1  . SPIRIVA RESPIMAT 1.25 MCG/ACT AERS Inhale 1 puff into the lungs daily.  3   Current Facility-Administered Medications on File Prior to Visit  Medication Dose Route Frequency Provider Last Rate Last Dose  . Mepolizumab SOLR 100 mg  100 mg Subcutaneous Q28 days Flora Lipps, MD   100 mg at 08/03/17 0923  . omalizumab Arvid Right) injection 300 mg  300 mg Subcutaneous Q14 Days Kasa, Kurian, MD   300 mg at 06/15/16 1209    BP (!) 150/86 (BP Location: Left Arm, Patient Position: Sitting, Cuff Size: Normal)   Pulse 87   Temp 98 F (36.7 C) (Oral)   Resp 16   Wt 165 lb 8 oz (75.1 kg)   SpO2 98%   BMI 30.27 kg/m       Objective:   Physical Exam  Constitutional: She is oriented to person, place, and  time. She appears well-developed and well-nourished.  HENT:  Left Ear: Tympanic membrane normal.  Nose: Right sinus exhibits no maxillary sinus tenderness and no frontal sinus tenderness. Left sinus exhibits no maxillary sinus tenderness and no frontal sinus tenderness.  Mouth/Throat: Oropharynx is clear and moist and mucous membranes are normal.  Right TM Dull with mild erythema present. Erythematous nares  Eyes: Pupils are equal, round, and reactive to light. No scleral icterus.  Neck: Neck supple.  Cardiovascular: Normal rate and normal heart sounds.  Pulmonary/Chest: Effort normal and breath sounds normal. She has no wheezes.  Musculoskeletal: She exhibits no edema.  Lymphadenopathy:    She has no cervical adenopathy.  Neurological: She is alert and oriented to person, place, and time.  Skin: Skin is warm and dry. No rash noted.  Psychiatric: She has a normal mood and affect. Her behavior is normal. Judgment and thought content normal.       Assessment & Plan:  1. Dysfunction of right eustachian tube Symptoms are most consistent with dysfunction of right eustachian tube. Mild erythema present and recent initiation of Augmentin for ear pain present. Advised completion of Augmentin as prescribed with food for symptoms with close follow up if no improvement. Reviewed causes and symptoms of eustachian tube dysfunction with conservative treatment measures provided.   2. Allergic rhinitis, unspecified seasonality, unspecified trigger Extensive history of allergies and asthma that is followed by pulmonology. Advised continued use of daily medication for allergies as prescribed by pulmonology. We discussed the importance of follow up with pulmonology provider and avoidance of triggers if possible.  She reports great control of asthma with current regimen. If no improvement, she will follow up for further evaluation and treatment.  Delano Metz, FNP-C

## 2018-04-08 ENCOUNTER — Encounter: Payer: Self-pay | Admitting: Family

## 2018-04-09 ENCOUNTER — Telehealth: Payer: 59 | Admitting: Physician Assistant

## 2018-04-09 DIAGNOSIS — J3089 Other allergic rhinitis: Secondary | ICD-10-CM

## 2018-04-09 DIAGNOSIS — J309 Allergic rhinitis, unspecified: Secondary | ICD-10-CM

## 2018-04-09 MED ORDER — PREDNISONE 10 MG PO TABS: 30.0000 mg | ORAL_TABLET | Freq: Every day | ORAL | 0 refills | Status: DC

## 2018-04-09 NOTE — Progress Notes (Signed)
E visit for Allergic Rhinitis We are sorry that you are not feeling well.  Her is how we plan to help!  Based on what you have shared with me it looks like you have Allergic Rhinitis.  Rhinitis is when a reaction occurs that causes nasal congestion, runny nose, sneezing, and itching.  Most types of rhinitis are caused by an inflammation and are associated with symptoms in the eyes ears or throat. There are several types of rhinitis.  The most common are acute rhinitis, which is usually caused by a viral illness, allergic or seasonal rhinitis, and nonallergic or year-round rhinitis.  Nasal allergies occur certain times of the year.  Allergic rhinitis is caused when allergens in the air trigger the release of histamine in the body.  Histamine causes itching, swelling, and fluid to build up in the fragile linings of the nasal passages, sinuses and eyelids.  An itchy nose and clear discharge are common.  Given you are already taking antihistamines and nasal spray the next logical step is prednisone.  This can increase your blood sugar so please monitor your diet closely over the next few days.  I am starting you on prednisone 30 mg every morning for five days. Please continue all of your other medications.   HOME CARE:   You can use an over-the-counter saline nasal spray as needed  Avoid areas where there is heavy dust, mites, or molds  Stay indoors on windy days during the pollen season  Keep windows closed in home, at least in bedroom; use air conditioner.  Use high-efficiency house air filter  Keep windows closed in car, turn AC on re-circulate  Avoid playing out with dog during pollen season  GET HELP RIGHT AWAY IF:   If your symptoms do not improve within 10 days  You become short of breath  You develop yellow or green discharge from your nose for over 3 days  You have coughing fits  MAKE SURE YOU:   Understand these instructions  Will watch your condition  Will get help  right away if you are not doing well or get worse  Thank you for choosing an e-visit. Your e-visit answers were reviewed by a board certified advanced clinical practitioner to complete your personal care plan. Depending upon the condition, your plan could have included both over the counter or prescription medications. Please review your pharmacy choice. Be sure that the pharmacy you have chosen is open so that you can pick up your prescription now.  If there is a problem you may message your provider in [place] to have the prescription routed to another pharmacy. Your safety is important to [place]. If you have drug allergies check your prescription carefully.  For the next 24 hours, you can use MyChart to ask questions about today's visit, request a non-urgent call back, or ask for a work or school excuse from your e-visit provider. You will get an email in the next two days asking about your experience. I hope that your e-visit has been valuable and will speed your recovery.

## 2018-04-12 NOTE — Progress Notes (Signed)
Cardiology Office Note  Date:  04/15/2018   ID:  Bridget Peters, DOB Mar 20, 1971, MRN [id number]  PCP:  Burnard Hawthorne, FNP   Chief Complaint  Patient presents with  . New Patient (Initial Visit)    Referred by PCP for family HX of Early CAD. Patient c/o SOB, being tired and tingling in left hand.  Meds reviewed verbally with patient.     HPI:  Bridget Peters is a 77 old woman with past medical history of Diabetes Hypertension Sleep apnea Depression Asthma,  followed by pulmonary Referred by Mable Paris for consultation of her family history of premature coronary disease and shortness of breath  She reports long history of complicated asthma Tried numerous medications, Often with intolerances or no benefit At baseline reports her breathing is poor Sleeps on 3 pillows at rest Significant allergies to almost anything Prednisone resistent Can't do allergy shots as she is allergic  Concerned about shortness of breath also concerned about family history of coronary disease parents Still alive and well with no cardiac history Brother with no cardiac history Grandfather with CAD, CABG  CT scan abdomen reviewed with her in detail,  Mild to moderate distal aorta atherosclerosis, otherwise no calcifications in the iliac arteries femoral arteries proximal descending aorta and distal coronary arteries  EKG personally reviewed by myself on todays visit Shows normal sinus rhythm with rate 83 bpm no significant ST or T-wave changes    PMH:   has a past medical history of Allergy, Asthma, Depression, Diabetes mellitus without complication ([place]), Hypertension, Pneumonia, Shortness of breath, Sleep apnea, and Thyroid disease.  PSH:    Past Surgical History:  Procedure Laterality Date  . BREAST SURGERY  01/2013   Breast Reduction   . NASAL SINUS SURGERY    . NASAL SINUS SURGERY  02/2014  . REDUCTION MAMMAPLASTY Bilateral 2014    Current Outpatient Medications   Medication Sig Dispense Refill  . albuterol (PROVENTIL HFA;VENTOLIN HFA) 108 (90 Base) MCG/ACT inhaler Inhale 2 puffs into the lungs every 4 (four) hours as needed for wheezing or shortness of breath. 1 Inhaler 6  . albuterol (PROVENTIL) (2.5 MG/3ML) 0.083% nebulizer solution Take 3 mLs (2.5 mg total) by nebulization every 6 (six) hours as needed for wheezing or shortness of breath. 75 mL 12  . amLODipine (NORVASC) 2.5 MG tablet Take 1 tablet (2.5 mg total) by mouth daily. 90 tablet 3  . buPROPion (WELLBUTRIN XL) 150 MG 24 hr tablet Take 1 tablet (150 mg total) by mouth 2 (two) times daily. Start 150 mg ER PO qam, increase after 3 days to 150 mg ER PO BID. 60 tablet 3  . cetirizine (ZYRTEC ALLERGY) 10 MG tablet Take 1 tablet (10 mg total) by mouth daily. 30 tablet 6  . EPIPEN 2-PAK 0.3 MG/0.3ML SOAJ injection   11  . fexofenadine (ALLEGRA) 180 MG tablet Take 1 tablet (180 mg total) by mouth daily. 30 tablet 0  . fluticasone (VERAMYST) 27.5 MCG/SPRAY nasal spray Place 2 sprays into the nose daily. 10 g 12  . fluticasone furoate-vilanterol (BREO ELLIPTA) 200-25 MCG/INH AEPB Inhale 1 puff into the lungs daily. 60 each 5  . INVOKANA 300 MG TABS tablet TAKE 1 TABLET BY MOUTH DAILY. 90 tablet 3  . levothyroxine (SYNTHROID, LEVOTHROID) 75 MCG tablet TAKE 1 TABLET BY MOUTH DAILY. 90 tablet 1  . meclizine (ANTIVERT) 32 MG tablet Take 1 tablet (32 mg total) by mouth 3 (three) times daily as needed for dizziness (vertigo). [place]  tablet 0  . Mepolizumab (NUCALA) 100 MG SOLR Inject 100 mg into the skin every 30 (thirty) days. 1 each 5  . metFORMIN (GLUCOPHAGE-XR) 500 MG 24 hr tablet   1  . montelukast (SINGULAIR) 10 MG tablet Take 1 tablet (10 mg total) by mouth at bedtime. 30 tablet 0  . naproxen (NAPROSYN) 500 MG tablet Take 1 tablet (500 mg total) by mouth 2 (two) times daily with a meal. 30 tablet 0  . pantoprazole (PROTONIX) 40 MG tablet Take 1 tablet (40 mg total) by mouth daily. 30 tablet 5  . sertraline  (ZOLOFT) 100 MG tablet TAKE 1 TABLET (100 MG TOTAL) BY MOUTH AT BEDTIME. 90 tablet 1  . SPIRIVA RESPIMAT 1.25 MCG/ACT AERS Inhale 1 puff into the lungs daily.  3   Current Facility-Administered Medications  Medication Dose Route Frequency Provider Last Rate Last Dose  . Mepolizumab SOLR 100 mg  100 mg Subcutaneous Q28 days Flora Lipps, MD   100 mg at 08/03/17 0923  . omalizumab Arvid Right) injection 300 mg  300 mg Subcutaneous Q14 Days Flora Lipps, MD   300 mg at 06/15/16 1209     Allergies:   Milk-related compounds; Shellfish allergy; Wheat bran; Xolair [omalizumab]; and Isovue [iopamidol]   Social History:  The patient  reports that she has never smoked. She has never used smokeless tobacco. She reports that she drinks alcohol. She reports that she does not use drugs.   Family History:   family history includes Breast cancer in her maternal aunt and paternal aunt; Breast cancer (age of onset: 89) in her maternal grandmother; Cancer in her maternal aunt; Cancer (age of onset: 85) in her maternal grandfather; Cancer (age of onset: 17) in her maternal grandmother; Colon polyps in her father; Endometrial cancer (age of onset: 72) in her mother; Hypertension in her father and mother; Stroke in her maternal grandmother.    Review of Systems: Review of Systems  Constitutional: Positive for malaise/fatigue.  Respiratory: Positive for shortness of breath.   Cardiovascular: Negative.   Gastrointestinal: Negative.   Musculoskeletal: Negative.   Neurological: Negative.   Psychiatric/Behavioral: Negative.   All other systems reviewed and are negative.    PHYSICAL EXAM: VS:  BP (!) 170/82 (BP Location: Right Arm, Patient Position: Sitting, Cuff Size: Normal)   Pulse 83   Ht 5\' 2"  (1.575 m)   Wt 165 lb 12 oz (75.2 kg)   BMI 30.32 kg/m  , BMI Body mass index is 30.32 kg/m. GEN: Well nourished, well developed, in no acute distress  HEENT: normal  Neck: no JVD, carotid bruits, or  masses Cardiac: RRR; no murmurs, rubs, or gallops,no edema  Respiratory:  clear to auscultation bilaterally, normal work of breathing GI: soft, nontender, nondistended, + BS MS: no deformity or atrophy  Skin: warm and dry, no rash Neuro:  Strength and sensation are intact Psych: euthymic mood, full affect   Recent Labs: 05/14/2017: ALT 22 01/02/2018: Hemoglobin 13.8; Platelets 280 02/13/2018: BUN 18; Creatinine, Ser 1.22; Potassium 4.0; Sodium 136    Lipid Panel Lab Results  Component Value Date   CHOL 161 07/11/2016   HDL 80.50 07/11/2016   LDLCALC 66 07/11/2016   TRIG 72.0 07/11/2016      Wt Readings from Last 3 Encounters:  04/15/18 165 lb 12 oz (75.2 kg)  04/04/18 165 lb 8 oz (75.1 kg)  04/02/18 166 lb (75.3 kg)       ASSESSMENT AND PLAN:  Controlled type 2 diabetes mellitus without complication,  without long-term current use of insulin (HCC) Unable to exercise secondary to shortness of breath Hemoglobin A1c relatively well-controlled  Essential hypertension Labile pressure worse in the office today typically well controlled at home She will monitor Blood pressure No medication changes made  OSA (obstructive sleep apnea) Unable to tolerate CPAP as she had allergy to the latex mask Reports that she has snoring Recommended she consider talking with her dentist about a dental guard  Hypercholesteremia Currently not on a statin Reports her numbers are typically well controlled  Shortness of breath Likely secondary to asthma though unable to exclude cardiac etiology Echocardiogram 4 years ago showing no pulmonary hypertension essentially normal study Order repeat echocardiogram for symptoms  Aortic atherosclerosis Seen on abdominal CT scan Not interested in statins at this time  Fatigue, unspecified type - Plan: EKG 12-Lead Etiology unclear, suspect secondary to long history of asthma, also OSA ( unable to tolerate CPAP)  Disposition:   F/U  As needed We  will call her with the results of her echocardiogram   Total encounter time more than 60 minutes  Greater than 50% was spent in counseling and coordination of care with the patient  Patient seen in consultation for Mable Paris for shortness of breath and family history of premature coronary disease LV referred back to her office for ongoing care of the issues detailed above   Orders Placed This Encounter  Procedures  . EKG 12-Lead     Signed, Esmond Plants, M.D., Ph.D. 04/15/2018  [place], [place]

## 2018-04-15 ENCOUNTER — Ambulatory Visit: Payer: 59 | Admitting: Cardiovascular Disease

## 2018-04-15 ENCOUNTER — Encounter: Payer: Self-pay | Admitting: Family

## 2018-04-15 ENCOUNTER — Encounter: Payer: Self-pay | Admitting: Cardiovascular Disease

## 2018-04-15 VITALS — BP 170/82 | HR 83 | Ht 62.0 in | Wt 165.8 lb

## 2018-04-15 DIAGNOSIS — I10 Essential (primary) hypertension: Secondary | ICD-10-CM

## 2018-04-15 DIAGNOSIS — R5383 Other fatigue: Secondary | ICD-10-CM | POA: Diagnosis not present

## 2018-04-15 DIAGNOSIS — R0602 Shortness of breath: Secondary | ICD-10-CM

## 2018-04-15 DIAGNOSIS — E7800 Pure hypercholesterolemia, unspecified: Secondary | ICD-10-CM

## 2018-04-15 DIAGNOSIS — G4733 Obstructive sleep apnea (adult) (pediatric): Secondary | ICD-10-CM

## 2018-04-15 DIAGNOSIS — I700 Atherosclerosis of aorta: Secondary | ICD-10-CM

## 2018-04-15 DIAGNOSIS — E119 Type 2 diabetes mellitus without complications: Secondary | ICD-10-CM

## 2018-04-15 NOTE — Patient Instructions (Addendum)
Medication Instructions:   No medication changes made  Labwork:  No new labs needed  Testing/Procedures: Your physician has requested that you have an echocardiogram. Echocardiography is a painless test that uses sound waves to create images of your heart. It provides your doctor with information about the size and shape of your heart and how well your heart's chambers and valves are working. This procedure takes approximately one hour. There are no restrictions for this procedure.   Think about/research CT coronary calicum score $951, [place]  [phone] [address] (3rd floor) [place]    Follow-Up: It was a pleasure seeing you in the office today. Please call us if you have new issues that need to be addressed before your next appt.  [phone]  Your physician wants you to follow-up in: 12 months.  You will receive a reminder letter in the mail two months in advance. If you don't receive a letter, please call our office to schedule the follow-up appointment.  If you need a refill on your cardiac medications before your next appointment, please call your pharmacy.  For educational health videos Log in to : [email or web address] Or : [email or web address], password : [email or web address]

## 2018-05-01 ENCOUNTER — Ambulatory Visit: Payer: 59 | Admitting: Family

## 2018-05-01 ENCOUNTER — Other Ambulatory Visit: Payer: Self-pay | Admitting: Family

## 2018-05-01 DIAGNOSIS — F321 Major depressive disorder, single episode, moderate: Secondary | ICD-10-CM

## 2018-05-01 DIAGNOSIS — R5383 Other fatigue: Secondary | ICD-10-CM

## 2018-05-02 ENCOUNTER — Other Ambulatory Visit: Payer: Self-pay

## 2018-05-02 ENCOUNTER — Ambulatory Visit (INDEPENDENT_AMBULATORY_CARE_PROVIDER_SITE_OTHER): Payer: 59

## 2018-05-02 ENCOUNTER — Telehealth: Payer: 59 | Admitting: Physician Assistant

## 2018-05-02 DIAGNOSIS — R0602 Shortness of breath: Secondary | ICD-10-CM | POA: Diagnosis not present

## 2018-05-02 DIAGNOSIS — T300 Burn of unspecified body region, unspecified degree: Secondary | ICD-10-CM | POA: Diagnosis not present

## 2018-05-02 MED ORDER — SILVER SULFADIAZINE 1 % EX CREA: 1.0000 "application " | TOPICAL_CREAM | Freq: Every day | CUTANEOUS | 0 refills | Status: DC

## 2018-05-02 MED ORDER — CEPHALEXIN 500 MG PO CAPS: 500.0000 mg | ORAL_CAPSULE | Freq: Two times a day (BID) | ORAL | 0 refills | Status: AC
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
## 2018-05-13 ENCOUNTER — Ambulatory Visit
Admission: RE | Admit: 2018-05-13 | Discharge: 2018-05-13 | Disposition: A | Discharge status: Home / Self Care | Payer: 59 | Source: Ambulatory Visit | Attending: Family | Admitting: Family

## 2018-05-13 DIAGNOSIS — Z1231 Encounter for screening mammogram for malignant neoplasm of breast: Secondary | ICD-10-CM | POA: Diagnosis not present

## 2018-05-17 ENCOUNTER — Encounter: Payer: Self-pay | Admitting: Family

## 2018-05-17 ENCOUNTER — Other Ambulatory Visit (HOSPITAL_COMMUNITY)
Admission: RE | Admit: 2018-05-17 | Discharge: 2018-05-17 | Disposition: A | Discharge status: Home / Self Care | Payer: 59 | Source: Ambulatory Visit | Attending: Family | Admitting: Family

## 2018-05-17 ENCOUNTER — Ambulatory Visit: Payer: 59 | Admitting: Family

## 2018-05-17 VITALS — BP 150/88 | HR 79 | Wt 164.1 lb

## 2018-05-17 DIAGNOSIS — I10 Essential (primary) hypertension: Secondary | ICD-10-CM | POA: Diagnosis not present

## 2018-05-17 DIAGNOSIS — Z124 Encounter for screening for malignant neoplasm of cervix: Secondary | ICD-10-CM | POA: Diagnosis not present

## 2018-05-17 DIAGNOSIS — N939 Abnormal uterine and vaginal bleeding, unspecified: Secondary | ICD-10-CM | POA: Diagnosis not present

## 2018-05-17 DIAGNOSIS — Z0000 Encounter for general adult medical examination without abnormal findings: Secondary | ICD-10-CM | POA: Diagnosis not present

## 2018-05-17 DIAGNOSIS — J4540 Moderate persistent asthma, uncomplicated: Secondary | ICD-10-CM | POA: Diagnosis not present

## 2018-05-17 IMAGING — CR DG CHEST 2V
2 series · 2 of 2 positions shown · non-contrast
Comparison: None.

CLINICAL DATA: Wheezing cough and chest tightness.

EXAM:
CHEST  2 VIEW

[chest pa]
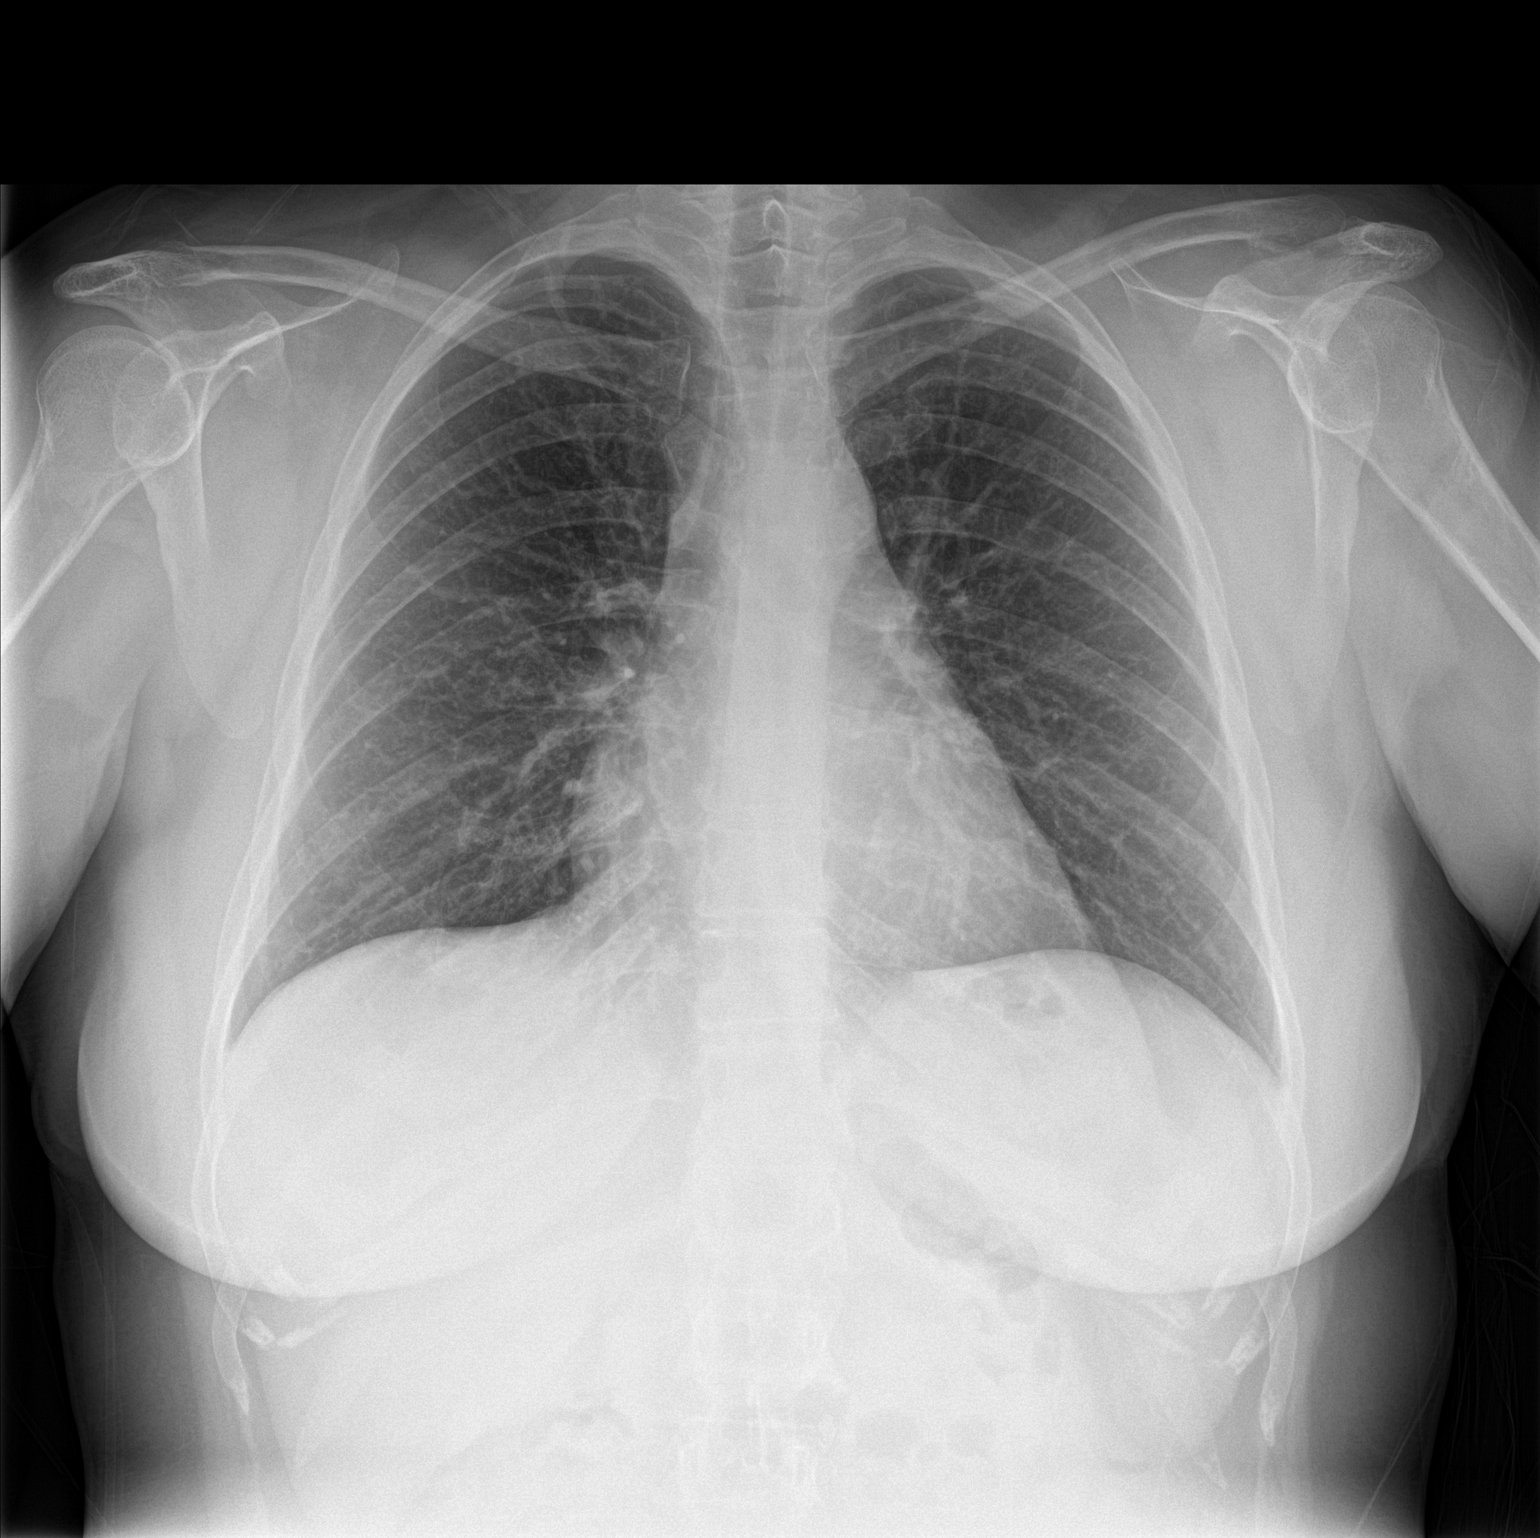

[chest lat]
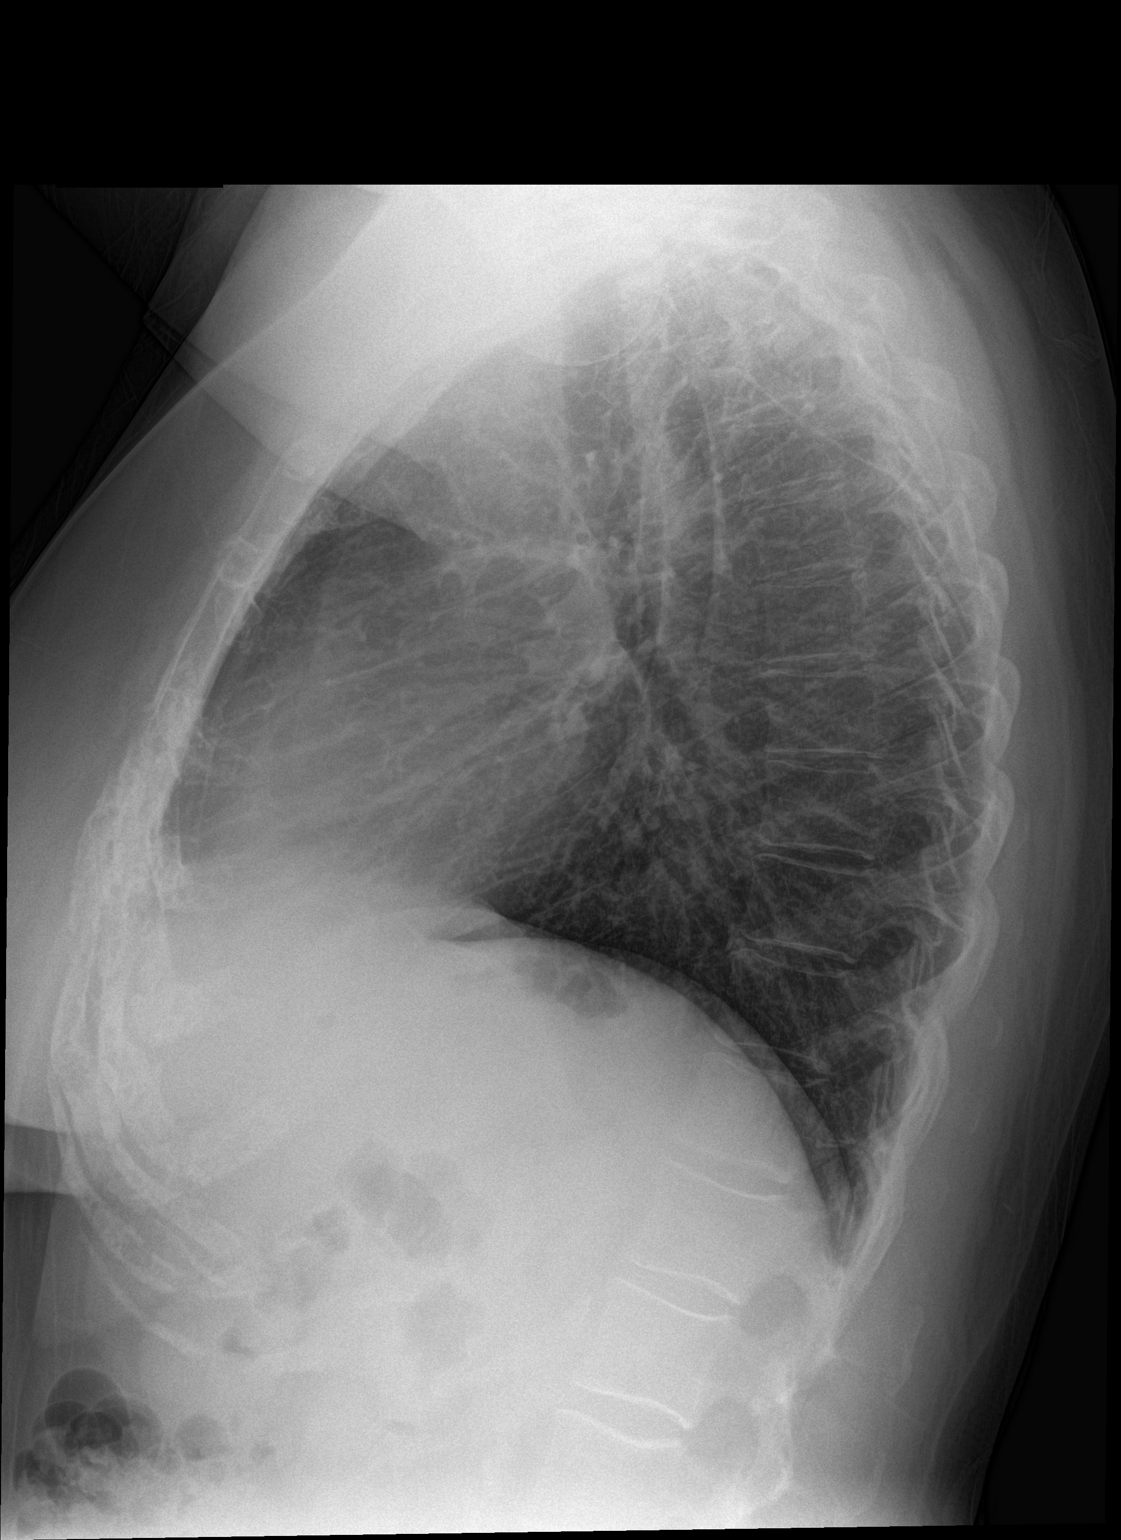

[2 of 2 positions shown; findings below may reference images not displayed]

FINDINGS: The heart size and mediastinal contours are within normal limits.
Both lungs are clear. The visualized skeletal structures are
unremarkable.
IMPRESSION: No active cardiopulmonary disease.

## 2018-05-17 NOTE — Progress Notes (Signed)
Subjective:    Patient ID: Bridget Peters, female    DOB: 05/19/1971, 47 y.o.   MRN: [id number]  CC: Bridget Peters is a 47 y.o. female who presents today for physical exam.    HPI: Overall feeling well  Still complains of chronic allergies and would like to see a new allergist for interest in nucala. She has had one dose from Dr Mortimer Fries.    [place]- 04/2018- repeat echo, declined statin therapy  Colorectal Cancer Screening: UTD . Due 2021 Breast Cancer Screening: Mammogram UTD Cervical Cancer Screening: Due. Itching, dry, scant to moderate of BR blood, thinks from itching.  Noticed for past couple of months. Not sexual intercourse. Last menses at 47 years old. No hot flashes, abdominal bloating.  Bone Health screening/DEXA for 65+: No increased fracture risk. Defer screening at this time. Lung Cancer Screening: Doesn't have 30 year pack year history and age > 28 years.       Tetanus - UTD        Pneumococcal - UTD Labs: Screening labs today. Exercise: Gets regular exercise.  Alcohol use: occasional Smoking/tobacco use: Nonsmoker.  Wears seat belt: Yes.  HISTORY:  Past Medical History:  Diagnosis Date  . Allergy    Seasonal  . Asthma   . Depression   . Diabetes mellitus without complication ([place])    Pt takes Metformin.  Marland Kitchen Hypertension   . Pneumonia   . Shortness of breath   . Sleep apnea   . Thyroid disease     Past Surgical History:  Procedure Laterality Date  . BREAST SURGERY  01/2013   Breast Reduction   . NASAL SINUS SURGERY    . NASAL SINUS SURGERY  02/2014  . REDUCTION MAMMAPLASTY Bilateral 2014   Family History  Problem Relation Age of Onset  . Hypertension Mother   . Endometrial cancer Mother 76  . Hypertension Father   . Colon polyps Father   . Cancer Maternal Aunt        Breast Cancer  . Breast cancer Maternal Aunt   . Breast cancer Paternal Aunt        Breast Cancer  . Cancer Maternal Grandmother 62       Bladder Cancer  . Stroke  Maternal Grandmother   . Breast cancer Maternal Grandmother 60  . Cancer Maternal Grandfather 98       Esophagus Cancer  . Colon cancer Neg Hx   . Ovarian cancer Neg Hx       ALLERGIES: Milk-related compounds; Shellfish allergy; Wheat bran; Xolair [omalizumab]; and Isovue [iopamidol]  Current Outpatient Medications on File Prior to Visit  Medication Sig Dispense Refill  . albuterol (PROVENTIL HFA;VENTOLIN HFA) 108 (90 Base) MCG/ACT inhaler Inhale 2 puffs into the lungs every 4 (four) hours as needed for wheezing or shortness of breath. 1 Inhaler 6  . albuterol (PROVENTIL) (2.5 MG/3ML) 0.083% nebulizer solution Take 3 mLs (2.5 mg total) by nebulization every 6 (six) hours as needed for wheezing or shortness of breath. 75 mL 12  . amLODipine (NORVASC) 2.5 MG tablet Take 1 tablet (2.5 mg total) by mouth daily. 90 tablet 3  . buPROPion (WELLBUTRIN XL) 150 MG 24 hr tablet Take 1 tablet (150 mg total) by mouth 2 (two) times daily. Start 150 mg ER PO qam, increase after 3 days to 150 mg ER PO BID. 60 tablet 3  . cetirizine (ZYRTEC ALLERGY) 10 MG tablet Take 1 tablet (10 mg total) by mouth daily. 30 tablet 6  .  EPIPEN 2-PAK 0.3 MG/0.3ML SOAJ injection   11  . fexofenadine (ALLEGRA) 180 MG tablet Take 1 tablet (180 mg total) by mouth daily. 30 tablet 0  . fluticasone (VERAMYST) 27.5 MCG/SPRAY nasal spray Place 2 sprays into the nose daily. 10 g 12  . fluticasone furoate-vilanterol (BREO ELLIPTA) 200-25 MCG/INH AEPB Inhale 1 puff into the lungs daily. 60 each 5  . INVOKANA 300 MG TABS tablet TAKE 1 TABLET BY MOUTH DAILY. 90 tablet 3  . levothyroxine (SYNTHROID, LEVOTHROID) 75 MCG tablet TAKE 1 TABLET BY MOUTH DAILY. 90 tablet 1  . meclizine (ANTIVERT) 32 MG tablet Take 1 tablet (32 mg total) by mouth 3 (three) times daily as needed for dizziness (vertigo). 30 tablet 0  . Mepolizumab (NUCALA) 100 MG SOLR Inject 100 mg into the skin every 30 (thirty) days. 1 each 5  . metFORMIN (GLUCOPHAGE-XR) 500 MG  24 hr tablet   1  . montelukast (SINGULAIR) 10 MG tablet Take 1 tablet (10 mg total) by mouth at bedtime. 30 tablet 0  . naproxen (NAPROSYN) 500 MG tablet Take 1 tablet (500 mg total) by mouth 2 (two) times daily with a meal. 30 tablet 0  . pantoprazole (PROTONIX) 40 MG tablet Take 1 tablet (40 mg total) by mouth daily. 30 tablet 5  . sertraline (ZOLOFT) 100 MG tablet TAKE 1 TABLET (100 MG TOTAL) BY MOUTH AT BEDTIME. 90 tablet 1  . silver sulfADIAZINE (SILVADENE) 1 % cream Apply 1 application topically daily. 50 g 0  . SPIRIVA RESPIMAT 1.25 MCG/ACT AERS Inhale 1 puff into the lungs daily.  3   Current Facility-Administered Medications on File Prior to Visit  Medication Dose Route Frequency Provider Last Rate Last Dose  . Mepolizumab SOLR 100 mg  100 mg Subcutaneous Q28 days Flora Lipps, MD   100 mg at 08/03/17 0923  . omalizumab Arvid Right) injection 300 mg  300 mg Subcutaneous Q14 Days Flora Lipps, MD   300 mg at 06/15/16 1209    Social History   Tobacco Use  . Smoking status: Never Smoker  . Smokeless tobacco: Never Used  Substance Use Topics  . Alcohol use: Yes    Alcohol/week: 0.0 standard drinks  . Drug use: No    Review of Systems  Constitutional: Negative for chills, fever and unexpected weight change.  HENT: Positive for congestion and postnasal drip (chronic).   Respiratory: Negative for cough.   Cardiovascular: Negative for chest pain, palpitations and leg swelling.  Gastrointestinal: Negative for nausea and vomiting.  Genitourinary: Positive for vaginal bleeding. Negative for dysuria.  Musculoskeletal: Negative for arthralgias and myalgias.  Skin: Negative for rash.  Neurological: Negative for headaches.  Hematological: Negative for adenopathy.  Psychiatric/Behavioral: Negative for confusion.      Objective:    BP (!) 150/88 (BP Location: Left Arm, Patient Position: Sitting, Cuff Size: Normal)   Pulse 79   Wt 164 lb 2 oz (74.4 kg)   SpO2 97%   BMI 30.02 kg/m    BP Readings from Last 3 Encounters:  05/17/18 (!) 150/88  04/15/18 (!) 170/82  04/04/18 (!) 150/86   Wt Readings from Last 3 Encounters:  05/17/18 164 lb 2 oz (74.4 kg)  04/15/18 165 lb 12 oz (75.2 kg)  04/04/18 165 lb 8 oz (75.1 kg)    Physical Exam  Constitutional: She appears well-developed and well-nourished.  Eyes: Conjunctivae are normal.  Neck: No thyroid mass and no thyromegaly present.  Cardiovascular: Normal rate, regular rhythm, normal heart sounds and normal  pulses.  Pulmonary/Chest: Effort normal and breath sounds normal. She has no wheezes. She has no rhonchi. She has no rales. Right breast exhibits no inverted nipple, no mass, no nipple discharge, no skin change and no tenderness. Left breast exhibits no inverted nipple, no mass, no nipple discharge, no skin change and no tenderness. Breasts are symmetrical.  No masses or asymmetry appreciated during CBE.  Genitourinary: Uterus is not enlarged, not fixed and not tender. Cervix exhibits no motion tenderness, no discharge and no friability. Right adnexum displays no mass, no tenderness and no fullness. Left adnexum displays no mass, no tenderness and no fullness.  Genitourinary Comments: Pap performed. No CMT. Unable to appreciated ovaries.  Lymphadenopathy:       Head (right side): No submental, no submandibular, no tonsillar, no preauricular, no posterior auricular and no occipital adenopathy present.       Head (left side): No submental, no submandibular, no tonsillar, no preauricular, no posterior auricular and no occipital adenopathy present.       Right cervical: No superficial cervical, no deep cervical and no posterior cervical adenopathy present.      Left cervical: No superficial cervical, no deep cervical and no posterior cervical adenopathy present.    She has no axillary adenopathy.       Right axillary: No pectoral and no lateral adenopathy present.       Left axillary: No pectoral and no lateral adenopathy  present. Neurological: She is alert.  Skin: Skin is warm and dry.  Psychiatric: She has a normal mood and affect. Her speech is normal and behavior is normal. Thought content normal.  Vitals reviewed.      Assessment & Plan:   Problem List Items Addressed This Visit      Cardiovascular and Mediastinum   HTN (hypertension)    Elevated; advised increase of amlodipine.         Respiratory   Asthma, moderate persistent    Chronic. Suspect allergies playing a role. Referral to allergist      Relevant Orders   Ambulatory referral to Allergy     Other   Routine physical examination - Primary    Clinical breast exam performed.  Pap performed.  No blood seen during pelvic exam.  However since patient is menopausal, explained to patient that vaginal bleeding would need to be further evaluated.  Referral placed to GYN.      Relevant Orders   CBC with Differential/Platelet   Comprehensive metabolic panel   Hemoglobin A1c   Lipid panel   TSH   VITAMIN D 25 Hydroxy (Vit-D Deficiency, Fractures)   Vaginal bleeding   Relevant Orders   Ambulatory referral to Obstetrics / Gynecology   Cervicovaginal ancillary only    Other Visit Diagnoses    Encounter for Papanicolaou smear for cervical cancer screening       Relevant Orders   Cytology - PAP       I am having Bridget Peters maintain her fluticasone, naproxen, Mepolizumab, buPROPion, EPIPEN 2-PAK, amLODipine, levothyroxine, INVOKANA, meclizine, montelukast, fexofenadine, SPIRIVA RESPIMAT, cetirizine, pantoprazole, fluticasone furoate-vilanterol, albuterol, albuterol, metFORMIN, sertraline, and silver sulfADIAZINE. We will continue to administer omalizumab and Mepolizumab.   No orders of the defined types were placed in this encounter.   Return precautions given.   Risks, benefits, and alternatives of the medications and treatment plan prescribed today were discussed, and patient expressed understanding.   Education  regarding symptom management and diagnosis given to patient on AVS.   Continue to  follow with Burnard Hawthorne, FNP for routine health maintenance.   Darlynn E Amerman and I agreed with plan.   Mable Paris, FNP

## 2018-05-17 NOTE — Patient Instructions (Signed)
Today we discussed referrals, orders.allergist, GYN   I have placed these orders in the system for you.  Please be sure to give [place] a call if you have not heard from our office regarding scheduling a test or regarding referral in a timely manner.  It is very important that you let me know as soon as possible.    Pleasure seeing you always

## 2018-05-20 ENCOUNTER — Encounter: Payer: Self-pay | Admitting: Family

## 2018-05-20 DIAGNOSIS — Z0000 Encounter for general adult medical examination without abnormal findings: Secondary | ICD-10-CM | POA: Insufficient documentation

## 2018-05-20 DIAGNOSIS — N939 Abnormal uterine and vaginal bleeding, unspecified: Secondary | ICD-10-CM | POA: Insufficient documentation

## 2018-05-20 LAB — CERVICOVAGINAL ANCILLARY ONLY: Wet Prep (BD Affirm): NEGATIVE

## 2018-05-20 NOTE — Assessment & Plan Note (Signed)
Elevated; advised increase of amlodipine.

## 2018-05-20 NOTE — Assessment & Plan Note (Signed)
Chronic. Suspect allergies playing a role. Referral to allergist

## 2018-05-20 NOTE — Assessment & Plan Note (Signed)
Clinical breast exam performed.  Pap performed.  No blood seen during pelvic exam.  However since patient is menopausal, explained to patient that vaginal bleeding would need to be further evaluated.  Referral placed to GYN.

## 2018-05-21 ENCOUNTER — Other Ambulatory Visit: Payer: Self-pay | Admitting: *Deleted

## 2018-05-21 LAB — CYTOLOGY - PAP
DIAGNOSIS: NEGATIVE
HPV (WINDOPATH): NOT DETECTED

## 2018-05-21 MED ORDER — AZITHROMYCIN 250 MG PO TABS: ORAL_TABLET | ORAL | 0 refills | Status: AC

## 2018-05-27 DIAGNOSIS — J4550 Severe persistent asthma, uncomplicated: Secondary | ICD-10-CM | POA: Diagnosis not present

## 2018-06-03 ENCOUNTER — Encounter: Payer: Self-pay | Admitting: Family

## 2018-06-06 ENCOUNTER — Other Ambulatory Visit
Admission: RE | Admit: 2018-06-06 | Discharge: 2018-06-06 | Disposition: A | Discharge status: Home / Self Care | Payer: 59 | Source: Ambulatory Visit | Attending: Family | Admitting: Family

## 2018-06-06 ENCOUNTER — Other Ambulatory Visit: Payer: Self-pay

## 2018-06-06 DIAGNOSIS — Z0000 Encounter for general adult medical examination without abnormal findings: Secondary | ICD-10-CM | POA: Insufficient documentation

## 2018-06-06 LAB — CBC WITH DIFFERENTIAL/PLATELET
Basophils Absolute: 0 10*3/uL (ref 0–0.1)
Basophils Relative: 0 %
Eosinophils Absolute: 0.1 10*3/uL (ref 0–0.7)
Eosinophils Relative: 2 %
HEMATOCRIT: 43.1 % (ref 35.0–47.0)
HEMOGLOBIN: 14.8 g/dL (ref 12.0–16.0)
LYMPHS PCT: 37 %
Lymphs Abs: 1.6 10*3/uL (ref 1.0–3.6)
MCH: 28.8 pg (ref 26.0–34.0)
MCHC: 34.4 g/dL (ref 32.0–36.0)
MCV: 83.8 fL (ref 80.0–100.0)
Monocytes Absolute: 0.3 10*3/uL (ref 0.2–0.9)
Monocytes Relative: 8 %
NEUTROS ABS: 2.3 10*3/uL (ref 1.4–6.5)
NEUTROS PCT: 53 %
Platelets: 233 10*3/uL (ref 150–440)
RBC: 5.14 MIL/uL (ref 3.80–5.20)
RDW: 13.9 % (ref 11.5–14.5)
WBC: 4.4 10*3/uL (ref 3.6–11.0)

## 2018-06-06 LAB — COMPREHENSIVE METABOLIC PANEL
ALBUMIN: 4.4 g/dL (ref 3.5–5.0)
ALT: 18 U/L (ref 0–44)
ANION GAP: 11 (ref 5–15)
AST: 18 U/L (ref 15–41)
Alkaline Phosphatase: 63 U/L (ref 38–126)
BUN: 24 mg/dL — ABNORMAL HIGH (ref 6–20)
CO2: 24 mmol/L (ref 22–32)
Calcium: 9.1 mg/dL (ref 8.9–10.3)
Chloride: 103 mmol/L (ref 98–111)
Creatinine, Ser: 0.77 mg/dL (ref 0.44–1.00)
GFR calc Af Amer: >60 mL/min (ref >60)
GFR calc non Af Amer: >60 mL/min (ref >60)
GLUCOSE: 121 mg/dL — AB (ref 70–99)
POTASSIUM: 4.4 mmol/L (ref 3.5–5.1)
Sodium: 138 mmol/L (ref 135–145)
TOTAL PROTEIN: 7.3 g/dL (ref 6.5–8.1)
Total Bilirubin: 0.8 mg/dL (ref 0.3–1.2)

## 2018-06-06 LAB — HEMOGLOBIN A1C
HEMOGLOBIN A1C: 6 % — AB (ref 4.8–5.6)
Mean Plasma Glucose: 125.5 mg/dL

## 2018-06-06 LAB — LIPID PANEL
Cholesterol: 334 mg/dL — ABNORMAL HIGH (ref 0–200)
HDL: 62 mg/dL (ref >40)
LDL CALC: 215 mg/dL — AB (ref 0–99)
Total CHOL/HDL Ratio: 5.4 RATIO
Triglycerides: 286 mg/dL — ABNORMAL HIGH (ref <150)
VLDL: 57 mg/dL — ABNORMAL HIGH (ref 0–40)

## 2018-06-06 LAB — TSH: TSH: 3.427 u[IU]/mL (ref 0.350–4.500)

## 2018-06-07 LAB — VITAMIN D 25 HYDROXY (VIT D DEFICIENCY, FRACTURES): Vit D, 25-Hydroxy: 25.8 ng/mL — ABNORMAL LOW (ref 30.0–100.0)

## 2018-06-18 ENCOUNTER — Encounter: Payer: Self-pay | Admitting: Internal Medicine

## 2018-06-18 ENCOUNTER — Ambulatory Visit: Payer: 59 | Admitting: Internal Medicine

## 2018-06-18 VITALS — BP 136/90 | HR 81 | Ht 62.0 in | Wt 164.0 lb

## 2018-06-18 DIAGNOSIS — G4719 Other hypersomnia: Secondary | ICD-10-CM

## 2018-06-18 DIAGNOSIS — J45909 Unspecified asthma, uncomplicated: Secondary | ICD-10-CM

## 2018-06-18 DIAGNOSIS — G4733 Obstructive sleep apnea (adult) (pediatric): Secondary | ICD-10-CM | POA: Diagnosis not present

## 2018-06-18 NOTE — Progress Notes (Signed)
Bridget Peters      Date: 06/18/2018,   MRN# [id number] Bridget Peters May 30, 1971    AdmissionWeight: 164 lb (74.4 kg)                 CurrentWeight: 164 lb (74.4 kg) Bridget Peters is a 47 y.o. old female seen in [place] for asthma  Synopsis-patient being treated for severe persistent ASTHMA over last 2 years Ige levels 320, Eos count 200 Nucala Therapy started Aug 2019  tarted Aug 2019  CHIEF COMPLAINT:   Follow up [place]  Patient with persistent symptoms of asthma +SOB and wheezing but not in nay acute distress at this time  She takes BREO and Spiriva  Patient received Nucala infection(did NOT have adverse reaction) last October but did not receive call back to get follow up injections, she recently started back 1 month ago   Patient had allergic reaction to Xolair on 06/13/16 had increased SOB, chest tightness and airway swelling Patient was sent to ER for observation    She seems to be under control at this time Persistent symptoms-no exacerbation at this time   Patient has been having excessive daytime sleepiness Patient has been having extreme fatigue and tiredness, lack of energy +  very Loud snoring every night + struggling breathe at night and gasps for air   EPWORTH score 11  Patient needs sleep study to assess for OSA  IgE levels     MEDICATIONS    Home Medication:   Current Medication:  Current Outpatient Medications:  .  albuterol (PROVENTIL HFA;VENTOLIN HFA) 108 (90 Base) MCG/ACT inhaler, Inhale 2 puffs into the lungs every 4 (four) hours as needed for wheezing or shortness of breath., Disp: 1 Inhaler, Rfl: 6 .  albuterol (PROVENTIL) (2.5 MG/3ML) 0.083% nebulizer solution, Take 3 mLs (2.5 mg total) by nebulization every 6 (six) hours as needed for wheezing or shortness of breath., Disp: 75 mL, Rfl: 12 .  amLODipine (NORVASC) 2.5 MG tablet, Take 1 tablet (2.5 mg total)  by mouth daily., Disp: 90 tablet, Rfl: 3 .  buPROPion (WELLBUTRIN XL) 150 MG 24 hr tablet, Take 1 tablet (150 mg total) by mouth 2 (two) times daily. Start 150 mg ER PO qam, increase after 3 days to 150 mg ER PO BID., Disp: 60 tablet, Rfl: 3 .  cetirizine (ZYRTEC ALLERGY) 10 MG tablet, Take 1 tablet (10 mg total) by mouth daily., Disp: 30 tablet, Rfl: 6 .  EPIPEN 2-PAK 0.3 MG/0.3ML SOAJ injection, , Disp: , Rfl: 11 .  fexofenadine (ALLEGRA) 180 MG tablet, Take 1 tablet (180 mg total) by mouth daily., Disp: 30 tablet, Rfl: 0 .  fluticasone (VERAMYST) 27.5 MCG/SPRAY nasal spray, Place 2 sprays into the nose daily., Disp: 10 g, Rfl: 12 .  fluticasone furoate-vilanterol (BREO ELLIPTA) 200-25 MCG/INH AEPB, Inhale 1 puff into the lungs daily., Disp: 60 each, Rfl: 5 .  INVOKANA 300 MG TABS tablet, TAKE 1 TABLET BY MOUTH DAILY., Disp: 90 tablet, Rfl: 3 .  levothyroxine (SYNTHROID, LEVOTHROID) 75 MCG tablet, TAKE 1 TABLET BY MOUTH DAILY., Disp: 90 tablet, Rfl: 1 .  meclizine (ANTIVERT) 32 MG tablet, Take 1 tablet (32 mg total) by mouth 3 (three) times daily as needed for dizziness (vertigo)., Disp: 30 tablet, Rfl: 0 .  Mepolizumab (NUCALA) 100 MG SOLR, Inject 100 mg into the skin every 30 (thirty) days., Disp: 1 each, Rfl: 5 .  metFORMIN (GLUCOPHAGE-XR) 500 MG 24 hr tablet, ,  Disp: , Rfl: 1 .  montelukast (SINGULAIR) 10 MG tablet, Take 1 tablet (10 mg total) by mouth at bedtime., Disp: 30 tablet, Rfl: 0 .  naproxen (NAPROSYN) 500 MG tablet, Take 1 tablet (500 mg total) by mouth 2 (two) times daily with a meal., Disp: 30 tablet, Rfl: 0 .  pantoprazole (PROTONIX) 40 MG tablet, Take 1 tablet (40 mg total) by mouth daily., Disp: 30 tablet, Rfl: 5 .  sertraline (ZOLOFT) 100 MG tablet, TAKE 1 TABLET (100 MG TOTAL) BY MOUTH AT BEDTIME., Disp: 90 tablet, Rfl: 1 .  silver sulfADIAZINE (SILVADENE) 1 % cream, Apply 1 application topically daily., Disp: 50 g, Rfl: 0 .  SPIRIVA RESPIMAT 1.25 MCG/ACT AERS, Inhale 1 puff  into the lungs daily., Disp: , Rfl: 3  Current Facility-Administered Medications:  Marland Kitchen  Mepolizumab SOLR 100 mg, 100 mg, Subcutaneous, Q28 days, Mortimer Fries, Aricela Bertagnolli, MD, 100 mg at 08/03/17 0923 .  omalizumab Arvid Right) injection 300 mg, 300 mg, Subcutaneous, Q14 Days, Leilynn Pilat, MD, 300 mg at 06/15/16 1209    ALLERGIES   Milk-related compounds; Shellfish allergy; Wheat bran; Xolair [omalizumab]; and Isovue [iopamidol]     REVIEW OF SYSTEMS   Review of Systems  Constitutional: Negative for chills, fever, malaise/fatigue and weight loss.  HENT: Negative for congestion.   Respiratory: Positive for cough. Negative for hemoptysis, sputum production, shortness of breath and wheezing.   Cardiovascular: Negative for chest pain.  Gastrointestinal: Negative for heartburn, nausea and vomiting.  Endo/Heme/Allergies: Negative.   All other systems reviewed and are negative.   BP 136/90 (BP Location: Left Arm, Cuff Size: Normal)   Pulse 81   Ht 5\' 2"  (1.575 m)   Wt 164 lb (74.4 kg)   SpO2 99%   BMI 30.00 kg/m    PHYSICAL EXAM  Physical Exam  Constitutional: She is oriented to person, place, and time. She appears well-developed and well-nourished. No distress.  HENT:  Mouth/Throat: No oropharyngeal exudate.  Neck: Neck supple.  Cardiovascular: Normal rate, regular rhythm and normal heart sounds.  No murmur heard. Pulmonary/Chest: Effort normal and breath sounds normal. No stridor. No respiratory distress. She has no wheezes.  Musculoskeletal: Normal range of motion.  Neurological: She is alert and oriented to person, place, and time. No cranial nerve deficit.  Skin: Skin is warm. She is not diaphoretic.  Psychiatric: She has a normal mood and affect.     IgE levels=320 Eosinophil count=200  IgE     ASSESSMENT/PLAN   47 yo white female seen today for severe chronic persistent asthma without exacerbation at this time with underlying signs and symptoms of sleep apnea     With  elevated IgE levels and elevated EOS count, with several ASTHMA exacerbations in the past 6 months. Patient meets criteria for Immunotherapy, she has started [facility] therapy Aug 2019   1.chronic severe persistent ASTHMA Patient needs to continue inhalers as prescribed ALB NEB and MDI Continue BREO 200 Continue Spiriva avoid all triggers - continue Nucala therapy-patient has chosen [place] allergy   2.Alerrghic rhinitis -continue Allegra and Veramyst  3.signs and symptoms of OSA Patient will need sleep study for assessment and diagnosis   Follow up in 3 months   Patient satisfied with Plan of action and management. All questions answered  [name], M.D.  [facility]  Medical Director [place] Director [facility] Cardio-Pulmonary Department

## 2018-06-18 NOTE — Patient Instructions (Signed)
Continue Nucala Therapy as prescribed Continue inhalers as prescribed  We will need Sleep study results and referral for new CPAP machine

## 2018-06-25 DIAGNOSIS — J4550 Severe persistent asthma, uncomplicated: Secondary | ICD-10-CM | POA: Diagnosis not present

## 2018-07-01 ENCOUNTER — Telehealth: Payer: 59 | Admitting: Family

## 2018-07-01 DIAGNOSIS — H109 Unspecified conjunctivitis: Secondary | ICD-10-CM | POA: Diagnosis not present

## 2018-07-01 MED ORDER — POLYMYXIN B-TRIMETHOPRIM 10000-0.1 UNIT/ML-% OP SOLN: 1.0000 [drp] | OPHTHALMIC | 0 refills | Status: DC

## 2018-07-01 NOTE — Progress Notes (Signed)

## 2018-07-02 DIAGNOSIS — N950 Postmenopausal bleeding: Secondary | ICD-10-CM | POA: Diagnosis not present

## 2018-07-02 DIAGNOSIS — N9089 Other specified noninflammatory disorders of vulva and perineum: Secondary | ICD-10-CM | POA: Diagnosis not present

## 2018-07-23 DIAGNOSIS — N950 Postmenopausal bleeding: Secondary | ICD-10-CM | POA: Diagnosis not present

## 2018-07-23 DIAGNOSIS — N9089 Other specified noninflammatory disorders of vulva and perineum: Secondary | ICD-10-CM | POA: Diagnosis not present

## 2018-07-25 DIAGNOSIS — Z91018 Allergy to other foods: Secondary | ICD-10-CM | POA: Diagnosis not present

## 2018-07-25 DIAGNOSIS — J301 Allergic rhinitis due to pollen: Secondary | ICD-10-CM | POA: Diagnosis not present

## 2018-07-25 DIAGNOSIS — J3081 Allergic rhinitis due to animal (cat) (dog) hair and dander: Secondary | ICD-10-CM | POA: Diagnosis not present

## 2018-07-25 DIAGNOSIS — J4550 Severe persistent asthma, uncomplicated: Secondary | ICD-10-CM | POA: Diagnosis not present

## 2018-07-25 DIAGNOSIS — J3089 Other allergic rhinitis: Secondary | ICD-10-CM | POA: Diagnosis not present

## 2018-07-31 DIAGNOSIS — J4550 Severe persistent asthma, uncomplicated: Secondary | ICD-10-CM | POA: Diagnosis not present

## 2018-08-19 ENCOUNTER — Ambulatory Visit: Payer: 59 | Admitting: Family

## 2018-08-19 ENCOUNTER — Encounter: Payer: Self-pay | Admitting: Family

## 2018-08-19 VITALS — BP 140/84 | HR 79 | Temp 98.2°F | Resp 16 | Ht 62.0 in | Wt 160.1 lb

## 2018-08-19 DIAGNOSIS — M25552 Pain in left hip: Secondary | ICD-10-CM

## 2018-08-19 MED ORDER — PREDNISONE 10 MG PO TABS: ORAL_TABLET | ORAL | 0 refills | Status: DC

## 2018-08-19 MED ORDER — GABAPENTIN 100 MG PO CAPS: 100.0000 mg | ORAL_CAPSULE | Freq: Every day | ORAL | 3 refills | Status: DC

## 2018-08-19 NOTE — Patient Instructions (Addendum)
Prednisone taper  Trial of gabapentin at bedtime- we will start low and can titrate  Up. No alcohol or driving on this medication - we need to get you used to it first and then we can increase.   Suspect trochanteric bursitis and nerve impingement ( sciatica)  If not improvement of pain, we will consult orthopedics, and pursue imaging   Sciatica Sciatica is pain, numbness, weakness, or tingling along your sciatic nerve. The sciatic nerve starts in the lower back and goes down the back of each leg. Sciatica happens when this nerve is pinched or has pressure put on it. Sciatica usually goes away on its own or with treatment. Sometimes, sciatica may keep coming back (recur). Follow these instructions at home: Medicines  Take over-the-counter and prescription medicines only as told by your doctor.  Do not drive or use heavy machinery while taking prescription pain medicine. Managing pain  If directed, put ice on the affected area. ? Put ice in a plastic bag. ? Place a towel between your skin and the bag. ? Leave the ice on for 20 minutes, 2-3 times a day.  After icing, apply heat to the affected area before you exercise or as often as told by your doctor. Use the heat source that your doctor tells you to use, such as a moist heat pack or a heating pad. ? Place a towel between your skin and the heat source. ? Leave the heat on for 20-30 minutes. ? Remove the heat if your skin turns bright red. This is especially important if you are unable to feel pain, heat, or cold. You may have a greater risk of getting burned. Activity  Return to your normal activities as told by your doctor. Ask your doctor what activities are safe for you. ? Avoid activities that make your sciatica worse.  Take short rests during the day. Rest in a lying or standing position. This is usually better than sitting to rest. ? When you rest for a long time, do some physical activity or stretching between periods of  rest. ? Avoid sitting for a long time without moving. Get up and move around at least one time each hour.  Exercise and stretch regularly, as told by your doctor.  Do not lift anything that is heavier than 10 lb (4.5 kg) while you have symptoms of sciatica. ? Avoid lifting heavy things even when you do not have symptoms. ? Avoid lifting heavy things over and over.  When you lift objects, always lift in a way that is safe for your body. To do this, you should: ? Bend your knees. ? Keep the object close to your body. ? Avoid twisting. General instructions  Use good posture. ? Avoid leaning forward when you are sitting. ? Avoid hunching over when you are standing.  Stay at a healthy weight.  Wear comfortable shoes that support your feet. Avoid wearing high heels.  Avoid sleeping on a mattress that is too soft or too hard. You might have less pain if you sleep on a mattress that is firm enough to support your back.  Keep all follow-up visits as told by your doctor. This is important. Contact a doctor if:  You have pain that: ? Wakes you up when you are sleeping. ? Gets worse when you lie down. ? Is worse than the pain you have had in the past. ? Lasts longer than 4 weeks.  You lose weight for without trying. Get help right away if:  You cannot control when you pee (urinate) or poop (have a bowel movement).  You have weakness in any of these areas and it gets worse. ? Lower back. ? Lower belly (pelvis). ? Butt (buttocks). ? Legs.  You have redness or swelling of your back.  You have a burning feeling when you pee. This information is not intended to replace advice given to you by your health care provider. Make sure you discuss any questions you have with your health care provider. Document Released: 07/04/2008 Document Revised: 03/02/2016 Document Reviewed: 06/04/2015 Elsevier Interactive Patient Education  2018 [employer].    Trochanteric Bursitis Trochanteric  bursitis is a condition that causes hip pain. Trochanteric bursitis happens when fluid-filled sacs (bursae) in the hip get irritated. Normally these sacs absorb shock and help strong bands of tissue (tendons) in your hip glide smoothly over each other and over your hip bones. What are the causes? This condition results from increased friction between the hip bones and the tendons that go over them. This condition can happen if you:  Have weak hips.  Use your hip muscles too much (overuse).  Get hit in the hip.  What increases the risk? This condition is more likely to develop in:  Women.  Adults who are middle-aged or older.  People with arthritis or a spinal condition.  People with weak buttocks muscles (gluteal muscles).  People who have one leg that is shorter than the other.  People who participate in certain kinds of athletic activities, such as: ? Running sports, especially long-distance running. ? Contact sports, like football or martial arts. ? Sports in which falls may occur, like skiing.  What are the signs or symptoms? The main symptom of this condition is pain and tenderness over the point of your hip. The pain may be:  Sharp and intense.  Dull and achy.  Felt on the outside of your thigh.  It may increase when you:  Lie on your side.  Walk or run.  Go up on stairs.  Sit.  Stand up after sitting.  Stand for long periods of time.  How is this diagnosed? This condition may be diagnosed based on:  Your symptoms.  Your medical history.  A physical exam.  Imaging tests, such as: ? X-rays to check your bones. ? An MRI or ultrasound to check your tendons and muscles.  During your physical exam, your health care provider will check the movement and strength of your hip. He or she may press on the point of your hip to check for pain. How is this treated? This condition may be treated by:  Resting.  Reducing your activity.  Avoiding activities  that cause pain.  Using crutches, a cane, or a walker to decrease the strain on your hip.  Taking medicine to help with swelling.  Having medicine injected into the bursae to help with swelling.  Using ice, heat, and massage therapy for pain relief.  Physical therapy exercises for strength and flexibility.  Surgery (rare).  Follow these instructions at home: Activity  Rest.  Avoid activities that cause pain.  Return to your normal activities as told by your health care provider. Ask your health care provider what activities are safe for you. Managing pain, stiffness, and swelling  Take over-the-counter and prescription medicines only as told by your health care provider.  If directed, apply heat to the injured area as told by your health care provider. ? Place a towel between your skin and the heat  source. ? Leave the heat on for 20-30 minutes. ? Remove the heat if your skin turns bright red. This is especially important if you are unable to feel pain, heat, or cold. You may have a greater risk of getting burned.  If directed, apply ice to the injured area: ? Put ice in a plastic bag. ? Place a towel between your skin and the bag. ? Leave the ice on for 20 minutes, 2-3 times a day. General instructions  If the affected leg is one that you use for driving, ask your health care provider when it is safe to drive.  Use crutches, a cane, or a walker as told by your health care provider.  If one of your legs is shorter than the other, get fitted for a shoe insert.  Lose weight if you are overweight. How is this prevented?  Wear supportive footwear that is appropriate for your sport.  If you have hip pain, start any new exercise or sport slowly.  Maintain physical fitness, including: ? Strength. ? Flexibility. Contact a health care provider if:  Your pain does not improve with 2-4 weeks. Get help right away if:  You develop severe pain.  You have a fever.  You  develop increased redness over your hip.  You have a change in your bowel function or bladder function.  You cannot control the muscles in your feet. This information is not intended to replace advice given to you by your health care provider. Make sure you discuss any questions you have with your health care provider. Document Released: 11/02/2004 Document Revised: 05/31/2016 Document Reviewed: 09/10/2015 Elsevier Interactive Patient Education  [employer].

## 2018-08-19 NOTE — Progress Notes (Signed)
Subjective:    Patient ID: [name], female    DOB: 05-Dec-1970, 47 y.o.   MRN: [id number]  CC: Twanisha E Camba is a 47 y.o. female who presents today for an acute visit.    HPI: Left hip pain, x 2 months, worsening. Daily. Worse with walking.  Cannot lay on left side due to pain.  Numbneess running down side to toes.  No h/o cancer. No groin pain. No urinary or bowel incontinence.  No low back pain.  Aleve is not helping.   Fell 5 months ago when walking down incline, and slipped on left side.  No pain at that time.   HISTORY:  Past Medical History:  Diagnosis Date  . Allergy    Seasonal  . Asthma   . Depression   . Diabetes mellitus without complication ([place])    Pt takes Metformin.  Marland Kitchen Hypertension   . Pneumonia   . Shortness of breath   . Sleep apnea   . Thyroid disease    Past Surgical History:  Procedure Laterality Date  . BREAST SURGERY  01/2013   Breast Reduction   . NASAL SINUS SURGERY    . NASAL SINUS SURGERY  02/2014  . REDUCTION MAMMAPLASTY Bilateral 2014   Family History  Problem Relation Age of Onset  . Hypertension Mother   . Endometrial cancer Mother 45  . Hypertension Father   . Colon polyps Father   . Cancer Maternal Aunt        Breast Cancer  . Breast cancer Maternal Aunt   . Breast cancer Paternal Aunt        Breast Cancer  . Cancer Maternal Grandmother 64       Bladder Cancer  . Stroke Maternal Grandmother   . Breast cancer Maternal Grandmother 60  . Cancer Maternal Grandfather 84       Esophagus Cancer  . Colon cancer Neg Hx   . Ovarian cancer Neg Hx     Allergies: Milk-related compounds; Shellfish allergy; Wheat bran; Xolair [omalizumab]; and Isovue [iopamidol] Current Outpatient Medications on File Prior to Visit  Medication Sig Dispense Refill  . albuterol (PROVENTIL HFA;VENTOLIN HFA) 108 (90 Base) MCG/ACT inhaler Inhale 2 puffs into the lungs every 4 (four) hours as needed for wheezing or shortness of breath. 1 Inhaler  6  . albuterol (PROVENTIL) (2.5 MG/3ML) 0.083% nebulizer solution Take 3 mLs (2.5 mg total) by nebulization every 6 (six) hours as needed for wheezing or shortness of breath. 75 mL 12  . amLODipine (NORVASC) 2.5 MG tablet Take 1 tablet (2.5 mg total) by mouth daily. 90 tablet 3  . buPROPion (WELLBUTRIN XL) 150 MG 24 hr tablet Take 1 tablet (150 mg total) by mouth 2 (two) times daily. Start 150 mg ER PO qam, increase after 3 days to 150 mg ER PO BID. 60 tablet 3  . cetirizine (ZYRTEC ALLERGY) 10 MG tablet Take 1 tablet (10 mg total) by mouth daily. 30 tablet 6  . EPIPEN 2-PAK 0.3 MG/0.3ML SOAJ injection   11  . fexofenadine (ALLEGRA) 180 MG tablet Take 1 tablet (180 mg total) by mouth daily. 30 tablet 0  . fluticasone (VERAMYST) 27.5 MCG/SPRAY nasal spray Place 2 sprays into the nose daily. 10 g 12  . fluticasone furoate-vilanterol (BREO ELLIPTA) 200-25 MCG/INH AEPB Inhale 1 puff into the lungs daily. 60 each 5  . INVOKANA 300 MG TABS tablet TAKE 1 TABLET BY MOUTH DAILY. 90 tablet 3  . levothyroxine (SYNTHROID, LEVOTHROID) 75  MCG tablet TAKE 1 TABLET BY MOUTH DAILY. 90 tablet 1  . meclizine (ANTIVERT) 32 MG tablet Take 1 tablet (32 mg total) by mouth 3 (three) times daily as needed for dizziness (vertigo). 30 tablet 0  . Mepolizumab (NUCALA) 100 MG SOLR Inject 100 mg into the skin every 30 (thirty) days. 1 each 5  . metFORMIN (GLUCOPHAGE-XR) 500 MG 24 hr tablet   1  . montelukast (SINGULAIR) 10 MG tablet Take 1 tablet (10 mg total) by mouth at bedtime. 30 tablet 0  . pantoprazole (PROTONIX) 40 MG tablet Take 1 tablet (40 mg total) by mouth daily. 30 tablet 5  . silver sulfADIAZINE (SILVADENE) 1 % cream Apply 1 application topically daily. 50 g 0  . trimethoprim-polymyxin b (POLYTRIM) ophthalmic solution Place 1 drop into the right eye every 4 (four) hours. 10 mL 0   Current Facility-Administered Medications on File Prior to Visit  Medication Dose Route Frequency Provider Last Rate Last Dose  .  Mepolizumab SOLR 100 mg  100 mg Subcutaneous Q28 days Flora Lipps, MD   100 mg at 08/03/17 0923  . omalizumab Arvid Right) injection 300 mg  300 mg Subcutaneous Q14 Days Flora Lipps, MD   300 mg at 06/15/16 1209    Social History   Tobacco Use  . Smoking status: Never Smoker  . Smokeless tobacco: Never Used  Substance Use Topics  . Alcohol use: Yes    Alcohol/week: 0.0 standard drinks  . Drug use: No    Review of Systems  Constitutional: Negative for chills and fever.  Respiratory: Negative for cough.   Cardiovascular: Negative for chest pain and palpitations.  Gastrointestinal: Negative for nausea and vomiting.  Genitourinary: Negative for difficulty urinating.  Musculoskeletal: Positive for back pain.  Neurological: Positive for numbness.      Objective:    BP 140/84 (BP Location: Left Arm, Patient Position: Sitting, Cuff Size: Normal)   Pulse 79   Temp 98.2 F (36.8 C) (Oral)   Resp 16   Ht 5\' 2"  (1.575 m)   Wt 160 lb 2 oz (72.6 kg)   SpO2 97%   BMI 29.29 kg/m    Physical Exam  Constitutional: She appears well-developed and well-nourished.  Eyes: Conjunctivae are normal.  Cardiovascular: Normal rate, regular rhythm, normal heart sounds and normal pulses.  Pulmonary/Chest: Effort normal and breath sounds normal. She has no wheezes. She has no rhonchi. She has no rales.  Musculoskeletal:       Left hip: She exhibits tenderness. She exhibits normal range of motion, normal strength and no bony tenderness.       Lumbar back: She exhibits normal range of motion, no tenderness, no bony tenderness, no swelling, no edema, no pain and no spasm.  Full range of motion with flexion, tension, lateral side bends. No bony tenderness. No pain, numbness, tingling elicited with single leg raise bilaterally.  Left Hip: No limp or waddling gait. Full ROM with flexion and hip rotation in flexion.   pain of lateral hip with  (flexion-abduction-external rotation) test.    pain with deep  palpation of greater trochanter.    Neurological: She is alert. She has normal strength. No sensory deficit.  Reflex Scores:      Patellar reflexes are 2+ on the right side and 2+ on the left side. Sensation and strength intact bilateral lower extremities.  Skin: Skin is warm and dry.  Psychiatric: She has a normal mood and affect. Her speech is normal and behavior is normal. Thought  content normal.  Vitals reviewed.      Assessment & Plan:   1. Left hip pain Symptoms most consistent with trochanteric bursitis, nerve impingement.  We agreed today to pursue conservative therapy with prednisone taper, gabapentin.  If no improvement, patient let me know we will consult orthopedics and pursue imaging.  Close follow-up - predniSONE (DELTASONE) 10 MG tablet; Take 4 tablets ( total 40 mg) by mouth for 2 days; take 3 tablets ( total 30 mg) by mouth for 2 days; take 2 tablets ( total 20 mg) by mouth for 1 day; take 1 tablet ( total 10 mg) by mouth for 1 day.  Dispense: 17 tablet; Refill: 0 - gabapentin (NEURONTIN) 100 MG capsule; Take 1 capsule (100 mg total) by mouth at bedtime.  Dispense: 90 capsule; Refill: 3    I have discontinued Keisha E. Schiffer's naproxen, SPIRIVA RESPIMAT, and sertraline. I am also having her maintain her fluticasone, Mepolizumab, buPROPion, EPIPEN 2-PAK, amLODipine, levothyroxine, INVOKANA, meclizine, montelukast, fexofenadine, cetirizine, pantoprazole, fluticasone furoate-vilanterol, albuterol, albuterol, metFORMIN, silver sulfADIAZINE, and trimethoprim-polymyxin b. We will continue to administer omalizumab and Mepolizumab.   No orders of the defined types were placed in this encounter.   Return precautions given.   Risks, benefits, and alternatives of the medications and treatment plan prescribed today were discussed, and patient expressed understanding.   Education regarding symptom management and diagnosis given to patient on AVS.  Continue to follow with  Burnard Hawthorne, FNP for routine health maintenance.   Aleni E Spampinato and I agreed with plan.   Mable Paris, FNP

## 2018-08-23 ENCOUNTER — Telehealth: Payer: Self-pay | Admitting: Internal Medicine

## 2018-08-23 NOTE — Telephone Encounter (Signed)
Patient returning call for home sleep study

## 2018-08-26 DIAGNOSIS — G4733 Obstructive sleep apnea (adult) (pediatric): Secondary | ICD-10-CM | POA: Diagnosis not present

## 2018-08-26 NOTE — Telephone Encounter (Signed)
Reached patient and scheduled HST pick up for today 08/26/18 between 3:30 - 4:30. Pt is aware of pick up date and time. Nothing else needed at this time. Rhonda J Cobb

## 2018-08-26 NOTE — Telephone Encounter (Signed)
Returned patient's call and went straight to voice mail.   LMOVM for pt to return my call to schedule.  Attempted to scheduled x 4. Rhonda J Cobb

## 2018-08-28 DIAGNOSIS — J4550 Severe persistent asthma, uncomplicated: Secondary | ICD-10-CM | POA: Diagnosis not present

## 2018-08-28 DIAGNOSIS — G4733 Obstructive sleep apnea (adult) (pediatric): Secondary | ICD-10-CM | POA: Diagnosis not present

## 2018-08-29 ENCOUNTER — Telehealth: Payer: Self-pay | Admitting: *Deleted

## 2018-08-29 DIAGNOSIS — G4733 Obstructive sleep apnea (adult) (pediatric): Secondary | ICD-10-CM

## 2018-08-29 NOTE — Telephone Encounter (Signed)
LMTCB  HST results: Mild OSA with AHI of 10 Recommend Auto-cpap with pressure range of 5-15 cmH20.

## 2018-08-29 NOTE — Telephone Encounter (Signed)
Pt aware of results Orders placed nothing further needed. 

## 2018-09-10 ENCOUNTER — Other Ambulatory Visit: Payer: Self-pay | Admitting: *Deleted

## 2018-09-10 DIAGNOSIS — G4719 Other hypersomnia: Secondary | ICD-10-CM

## 2018-09-17 DIAGNOSIS — G4733 Obstructive sleep apnea (adult) (pediatric): Secondary | ICD-10-CM | POA: Diagnosis not present

## 2018-09-17 DIAGNOSIS — R0683 Snoring: Secondary | ICD-10-CM | POA: Diagnosis not present

## 2018-09-17 DIAGNOSIS — G4710 Hypersomnia, unspecified: Secondary | ICD-10-CM | POA: Diagnosis not present

## 2018-09-26 DIAGNOSIS — J4550 Severe persistent asthma, uncomplicated: Secondary | ICD-10-CM | POA: Diagnosis not present

## 2018-10-04 ENCOUNTER — Other Ambulatory Visit: Payer: Self-pay | Admitting: Internal Medicine

## 2018-10-04 ENCOUNTER — Other Ambulatory Visit: Payer: Self-pay | Admitting: Family

## 2018-10-04 DIAGNOSIS — E039 Hypothyroidism, unspecified: Secondary | ICD-10-CM

## 2018-10-07 ENCOUNTER — Other Ambulatory Visit: Payer: Self-pay | Admitting: Family

## 2018-10-07 DIAGNOSIS — E119 Type 2 diabetes mellitus without complications: Secondary | ICD-10-CM

## 2018-10-28 DIAGNOSIS — J4550 Severe persistent asthma, uncomplicated: Secondary | ICD-10-CM | POA: Diagnosis not present

## 2018-11-01 ENCOUNTER — Telehealth: Payer: 59 | Admitting: Family

## 2018-11-01 DIAGNOSIS — J45901 Unspecified asthma with (acute) exacerbation: Secondary | ICD-10-CM | POA: Diagnosis not present

## 2018-11-01 MED ORDER — PREDNISONE 20 MG PO TABS: 40.0000 mg | ORAL_TABLET | Freq: Every day | ORAL | 0 refills | Status: DC

## 2018-11-01 MED ORDER — ALBUTEROL SULFATE HFA 108 (90 BASE) MCG/ACT IN AERS: 2.0000 | INHALATION_SPRAY | RESPIRATORY_TRACT | 6 refills | Status: DC | PRN

## 2018-11-01 MED ORDER — ALBUTEROL SULFATE (2.5 MG/3ML) 0.083% IN NEBU: 2.5000 mg | INHALATION_SOLUTION | Freq: Four times a day (QID) | RESPIRATORY_TRACT | 12 refills | Status: DC | PRN

## 2018-11-01 NOTE — Progress Notes (Signed)
Thank you for the details you included in the comment boxes. Those details are very helpful in determining the best course of treatment for you and help [place] to provide the best care.                                                                                                                  E Visit for Asthma  Based on what you have shared with me, it looks like you may have a flare up of your asthma.  Asthma is a chronic (ongoing) lung disease which results in airway obstruction, inflammation and hyper-responsiveness.   Asthma symptoms vary from person to person, with common symptoms including nighttime awakening and decreased ability to participate in normal activities as a result of shortness of breath. It is often triggered by changes in weather, changes in the season, changes in air temperature, or inside (home, school, daycare or work) allergens such as animal dander, mold, mildew, woodstoves or cockroaches.   It can also be triggered by hormonal changes, extreme emotion, physical exertion or an upper respiratory tract illness.     It is important to identify the trigger, and then eliminate or avoid the trigger if possible.   If you have been prescribed medications to be taken on a regular basis, it is important to follow the asthma action plan and to follow guidelines to adjust medication in response to increasing symptoms of decreased peak expiratory flow rate  Treatment: I have prescribed: Albuterol (Proventil HFA; Ventolin HFA) 108 (90 Base) MCG/ACT Inhaler 2 puffs into the lungs every six hours as needed for wheezing or shortness of breath, Albuterol (Proventil) (2.5 mg in 3 mL) 0.083 % Take 26mls by nebulization solution every six hours as needed for wheezing or shortness and Prednisone 40mg  by mouth per day for 5 - 7 days   HOME CARE . Only take medications as instructed by your medical team. . Consider wearing a mask or scarf to improve breathing air temperature have been shown to  decrease irritation and decrease exacerbations . Get rest. . Taking a steamy shower or using a humidifier may help nasal congestion sand ease sore throat pain. You can place a towel over your head and breathe in the steam from hot water coming from a faucet. . Using a saline nasal spray works much the same way.  . Cough drops, hare candies and sore throat lozenges may ease your cough.  . Avoid close contacts especially the very you and the elderly . Cover your mouth if you cough or sneeze . Always remember to wash your hands.    GET HELP RIGHT AWAY IF: . You develop worsening symptoms; breathlessness at rest, drowsy, confused or agitated, unable to speak in full sentences . You have coughing fits . You develop a severe headache or visual changes . You develop shortness of breath, difficulty breathing or start having chest pain . Your symptoms persist after you have completed your treatment plan . If your symptoms do not improve within  10 days  MAKE SURE YOU . Understand these instructions. . Will watch your condition. . Will get help right away if you are not doing well or get worse.   Your e-visit answers were reviewed by a board certified advanced clinical practitioner to complete your personal care plan, Depending upon the condition, your plan could have included both over the counter or prescription medications.  Please review your pharmacy choice. Your safety is important to [place]. If you have drug allergies check your prescription carefully. You can use MyChart to ask questions about today's visit, request a non-urgent call back, or ask for a work or school excuse for 24 hours related to this e-Visit. If it has been greater than 24 hours you will need to follow up with your provider, or enter a new e-Visit to address those concerns.  You will get an e-mail in the next two days asking about your experience. I hope that your e-visit has been valuable and will speed your recovery. Thank  you for using e-visits.

## 2018-11-03 ENCOUNTER — Telehealth: Payer: 59 | Admitting: Physician Assistant

## 2018-11-03 DIAGNOSIS — R6889 Other general symptoms and signs: Secondary | ICD-10-CM

## 2018-11-03 DIAGNOSIS — R079 Chest pain, unspecified: Secondary | ICD-10-CM

## 2018-11-03 DIAGNOSIS — R0600 Dyspnea, unspecified: Secondary | ICD-10-CM

## 2018-11-03 NOTE — Progress Notes (Signed)
Based on what you shared with me it looks like you have a condition that should be evaluated in a face to face office visit.  The majority of your symptoms seem consistent with a flu like illness, however your mention of chest pain and shortness of breath requires you to have an in person assessment and good examination of your lungs.   NOTE: If you entered your credit card information for this eVisit, you will not be charged. You may see a "hold" on your card for the $30 but that hold will drop off and you will not have a charge processed.  If you are having a true medical emergency please call 911.  If you need an urgent face to face visit, [place] has four urgent care centers for your convenience.  If you need care fast and have a high deductible or no insurance consider:   [email or web address] to reserve your spot online an avoid wait times  [facility] [address] [place], [place] [address] 8 am to 8 pm Monday-Friday 10 am to 4 pm Saturday-Sunday *Across the street from [employer]  [place], [address] 8 am to 5 pm Monday-Friday * In the [facility] on the [facility]   The following sites will take your  insurance:  . [facility] [place] a Provider at this Location  [address] [place], [place] [address] . 10 am to 8 pm Monday-Friday . 12 pm to 8 pm Saturday-Sunday   . [facility] [place] a Provider at this Location  [address], [place], [place] [address] . 8 am to 8 pm Monday-Friday . 9 am to 6 pm Saturday . 11 am to 6 pm Sunday   . [facility] at [place] Get Driving Directions  [address] [place], [place] [address] . 8 am to 8 pm Monday-Friday . 8 am to 4 pm  Saturday-Sunday   Your e-visit answers were reviewed by a board certified advanced clinical practitioner to complete your personal care plan.  Thank you for using e-Visits.

## 2018-11-04 ENCOUNTER — Encounter: Payer: Self-pay | Admitting: Physician Assistant

## 2018-11-04 ENCOUNTER — Ambulatory Visit (INDEPENDENT_AMBULATORY_CARE_PROVIDER_SITE_OTHER): Payer: Self-pay | Admitting: Physician Assistant

## 2018-11-04 VITALS — BP 122/88 | HR 78 | Temp 98.0°F | Resp 16 | Ht 62.0 in | Wt 161.0 lb

## 2018-11-04 DIAGNOSIS — N950 Postmenopausal bleeding: Secondary | ICD-10-CM | POA: Insufficient documentation

## 2018-11-04 DIAGNOSIS — R6889 Other general symptoms and signs: Secondary | ICD-10-CM

## 2018-11-04 MED ORDER — BALOXAVIR MARBOXIL(80 MG DOSE) 2 X 40 MG PO TBPK: 80.0000 mg | ORAL_TABLET | Freq: Once | ORAL | 0 refills | Status: AC

## 2018-11-04 MED ORDER — HYDROCODONE-HOMATROPINE 5-1.5 MG/5ML PO SYRP: 5.0000 mL | ORAL_SOLUTION | Freq: Three times a day (TID) | ORAL | 0 refills | Status: DC | PRN

## 2018-11-04 NOTE — Addendum Note (Signed)
Addended by: Vassie Loll D on: 11/04/2018 10:37 AM   Modules accepted: Orders

## 2018-11-04 NOTE — Patient Instructions (Addendum)
Thank you for choosing [facility] for your health care needs.  You have been diagnosed with influenza. Your rapid flu test was negative; however, based on your symptoms, do believe you have flu.  Recommend you increase fluids; water, Gatorade, hot tea with lemon/honey, or diluted juice (1/2 orange juice and 1/2 water). Rest. Use cool mist humidifier in bedroom. Prop self up with several pillows or sleep in recliner, will help with cough.  Take over the counter Tylenol or ibuprofen for pain/fever; take with food to prevent stomach upset. May use over the counter Delsym or Robitussin-DM for cough.  Continue asthma medication. May use albuterol inhaler or albuterol nebulizer machine every 4 hours for chest tightness/wheezing.  Take prescription meds as prescribed (Xofluza and Hycodan cough syrup).  Follow-up with family physician or pulmonologist or urgent care if your symptoms worsen or do not improve in one week. Go immediately if you develop chest pain, difficulty breathing, shortness of breath, or other new/concerning symptom.   Influenza, Adult Influenza is also called "the flu." It is an infection in the lungs, nose, and throat (respiratory tract). It is caused by a virus. The flu causes symptoms that are similar to symptoms of a cold. It also causes a high fever and body aches. The flu spreads easily from person to person (is contagious). Getting a flu shot (influenza vaccination) every year is the best way to prevent the flu. What are the causes? This condition is caused by the influenza virus. You can get the virus by:  Breathing in droplets that are in the air from the cough or sneeze of a person who has the virus.  Touching something that has the virus on it (is contaminated) and then touching your mouth, nose, or eyes. What increases the risk? Certain things may make you more likely to get the flu. These include:  Not washing your hands often.  Having close contact with many  people during cold and flu season.  Touching your mouth, eyes, or nose without first washing your hands.  Not getting a flu shot every year. You may have a higher risk for the flu, along with serious problems such as a lung infection (pneumonia), if you:  Are older than 65.  Are pregnant.  Have a weakened disease-fighting system (immune system) because of a disease or taking certain medicines.  Have a long-term (chronic) illness, such as: ? Heart, kidney, or lung disease. ? Diabetes. ? Asthma.  Have a liver disorder.  Are very overweight (morbidly obese).  Have anemia. This is a condition that affects your red blood cells. What are the signs or symptoms? Symptoms usually begin suddenly and last 4-14 days. They may include:  Fever and chills.  Headaches, body aches, or muscle aches.  Sore throat.  Cough.  Runny or stuffy (congested) nose.  Chest discomfort.  Not wanting to eat as much as normal (poor appetite).  Weakness or feeling tired (fatigue).  Dizziness.  Feeling sick to your stomach (nauseous) or throwing up (vomiting). How is this treated? If the flu is found early, you can be treated with medicine that can help reduce how bad the illness is and how long it lasts (antiviral medicine). This may be given by mouth (orally) or through an IV tube. Taking care of yourself at home can help your symptoms get better. Your doctor may suggest:  Taking over-the-counter medicines.  Drinking plenty of fluids. The flu often goes away on its own. If you have very bad symptoms or other  problems, you may be treated in a hospital. Follow these instructions at home:     Activity  Rest as needed. Get plenty of sleep.  Stay home from work or school as told by your doctor. ? Do not leave home until you do not have a fever for 24 hours without taking medicine. ? Leave home only to visit your doctor. Eating and drinking  Take an ORS (oral rehydration solution). This is  a drink that is sold at pharmacies and stores.  Drink enough fluid to keep your pee (urine) pale yellow.  Drink clear fluids in small amounts as you are able. Clear fluids include: ? Water. ? Ice chips. ? Fruit juice that has water added (diluted fruit juice). ? Low-calorie sports drinks.  Eat bland, easy-to-digest foods in small amounts as you are able. These foods include: ? Bananas. ? Applesauce. ? Rice. ? Lean meats. ? Toast. ? Crackers.  Do not eat or drink: ? Fluids that have a lot of sugar or caffeine. ? Alcohol. ? Spicy or fatty foods. General instructions  Take over-the-counter and prescription medicines only as told by your doctor.  Use a cool mist humidifier to add moisture to the air in your home. This can make it easier for you to breathe.  Cover your mouth and nose when you cough or sneeze.  Wash your hands with soap and water often, especially after you cough or sneeze. If you cannot use soap and water, use alcohol-based hand sanitizer.  Keep all follow-up visits as told by your doctor. This is important. How is this prevented?   Get a flu shot every year. You may get the flu shot in late summer, fall, or winter. Ask your doctor when you should get your flu shot.  Avoid contact with people who are sick during fall and winter (cold and flu season). Contact a doctor if:  You get new symptoms.  You have: ? Chest pain. ? Watery poop (diarrhea). ? A fever.  Your cough gets worse.  You start to have more mucus.  You feel sick to your stomach.  You throw up. Get help right away if you:  Have shortness of breath.  Have trouble breathing.  Have skin or nails that turn a bluish color.  Have very bad pain or stiffness in your neck.  Get a sudden headache.  Get sudden pain in your face or ear.  Cannot eat or drink without throwing up. Summary  Influenza ("the flu") is an infection in the lungs, nose, and throat. It is caused by a  virus.  Take over-the-counter and prescription medicines only as told by your doctor.  Getting a flu shot every year is the best way to avoid getting the flu. This information is not intended to replace advice given to you by your health care provider. Make sure you discuss any questions you have with your health care provider. Document Released: 07/04/2008 Document Revised: 03/13/2018 Document Reviewed: 03/13/2018 Elsevier Interactive Patient Education  2019 [employer].

## 2018-11-04 NOTE — Progress Notes (Signed)
Patient ID: [name] DOB: 1971-07-24 AGE: 48 y.o. MRN: [id number]   PCP: Burnard Hawthorne, FNP   Chief Complaint:  Chief Complaint  Patient presents with  . Fever    x1d  . Fatigue    x1d  . Generalized Body Aches    x1d     Subjective:    HPI:  Bridget Peters is a 48 y.o. female presents for evaluation  Chief Complaint  Patient presents with  . Fever    x1d  . Fatigue    x1d  . Generalized Body Aches    x59d   48 year old female presents to [facility] with 2 day history of flu-like symptoms. Began yesterday morning. Reports fever (tmax 101F), sweats, chills, headache, right ear pain (chronic, known ETD), nasal congestion, rhinorrhea, and dry cough. Cough tight. Associated nausea and lack of appetite. Has taken OTC Tylenol and Mucinex Cold & Flu with minimal symptom relief.   Patient performed at [facility] on 11/01/2018. Reported 3-day history of coughing, chest pain, wheezing, and shortness of breath. Patient diagnosed with asthma exacerbation. Prescribed albuterol inhaler, albuterol for nebulizer machine, and prednisone 40mg  qd x 5-7 days. Felt better, never fully resolved.  Patient performed an E-Visit on 11/03/2018, yesterday, with report of flu-like sympotms. Reported less than 48 hours of cough, sore throat, headache, back pain, and leg pain. Patient was advised to seek in-person evaluation due to concern of severity of symptoms.  Patient did receive this season's influenza vaccination (in Sept/Oct 2019). Patient with previous history of influenza, last year, current symptoms feel similar. No smoking history. Patient underwent sinus surgery 5 years ago; did not notice any decreased frequency of sinusitis episodes. Patient with asthma; diagnosed 10-15 years ago. Currently managed by Dr. Flora Lipps MD with [place]. Patient on albuterol inhaler and albuterol nebulizer treatment (prn), Breo inhaler, Nucala injection monthly, and  Flonase nasal spray and Zyrtec for associated allergies. Patient also treated by PCP for HTN and DM2 (on Metformin and Invokana); last A1C 6.0 on 06/06/2018.  A limited review of symptoms was performed, pertinent positives and negatives as mentioned in HPI.  The following portions of the patient's history were reviewed and updated as appropriate: allergies, current medications and past medical history.  Patient Active Problem List   Diagnosis Date Noted  . PMB (postmenopausal bleeding) 11/04/2018  . Routine physical examination 05/20/2018  . Vaginal bleeding 05/20/2018  . Aortic atherosclerosis ([place]) 04/15/2018  . Sinus congestion 02/13/2018  . Bronchitis 08/20/2017  . Osteopenia 05/14/2017  . OSA (obstructive sleep apnea) 04/24/2017  . Elevated hemoglobin ([place]) 03/09/2017  . Fatty liver 01/17/2017  . Fatigue 01/01/2017  . HTN (hypertension) 01/01/2017  . 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 Depression 07/11/2016  . Allergic reaction 06/13/2016  . Hypercholesteremia 04/26/2016  . Screening mammogram, encounter for 07/14/2015  . Hypothyroidism 07/14/2015  . Diabetes mellitus type 2, controlled, without complications ([place]) 23/76/2831  . Multiple environmental allergies 07/14/2015  . Asthma, moderate persistent 07/14/2015    Allergies  Allergen Reactions  . Milk-Related Compounds Hives  . Shellfish Allergy Hives    Shortness of breath  . Wheat Bran Hives  . Xolair [Omalizumab]     Itching  . Isovue [Iopamidol] Hives    Pt developed hives approximately 6 hours after receiving Isovue 300 for CT Scan. She was given 50mG  oral Benadryl and symptoms improved.     Current Outpatient Medications on File Prior to Visit  Medication Sig Dispense Refill  .  albuterol (PROVENTIL HFA;VENTOLIN HFA) 108 (90 Base) MCG/ACT inhaler Inhale 2 puffs into the lungs every 4 (four) hours as needed for wheezing or shortness of breath. 1 Inhaler 6  . albuterol (PROVENTIL) (2.5 MG/3ML) 0.083% nebulizer  solution Take 3 mLs (2.5 mg total) by nebulization every 6 (six) hours as needed for wheezing or shortness of breath. 75 mL 12  . cetirizine (ZYRTEC ALLERGY) 10 MG tablet Take 1 tablet (10 mg total) by mouth daily. 30 tablet 6  . EPIPEN 2-PAK 0.3 MG/0.3ML SOAJ injection   11  . fexofenadine (ALLEGRA) 180 MG tablet Take 1 tablet (180 mg total) by mouth daily. 30 tablet 0  . fluticasone (VERAMYST) 27.5 MCG/SPRAY nasal spray Place 2 sprays into the nose daily. 10 g 12  . fluticasone furoate-vilanterol (BREO ELLIPTA) 200-25 MCG/INH AEPB Inhale 1 puff into the lungs daily. 60 each 5  . gabapentin (NEURONTIN) 100 MG capsule Take 1 capsule (100 mg total) by mouth at bedtime. 90 capsule 3  . INVOKANA 300 MG TABS tablet TAKE 1 TABLET BY MOUTH DAILY. 90 tablet 3  . levothyroxine (SYNTHROID, LEVOTHROID) 75 MCG tablet TAKE 1 TABLET BY MOUTH DAILY. 90 tablet 1  . Mepolizumab (NUCALA) 100 MG SOLR Inject 100 mg into the skin every 30 (thirty) days. 1 each 5  . pantoprazole (PROTONIX) 40 MG tablet TAKE 1 TABLET BY MOUTH DAILY. 30 tablet 5  . silver sulfADIAZINE (SILVADENE) 1 % cream Apply 1 application topically daily. 50 g 0   Current Facility-Administered Medications on File Prior to Visit  Medication Dose Route Frequency Provider Last Rate Last Dose  . Mepolizumab SOLR 100 mg  100 mg Subcutaneous Q28 days Flora Lipps, MD   100 mg at 08/03/17 0923  . omalizumab Arvid Right) injection 300 mg  300 mg Subcutaneous Q14 Days Flora Lipps, MD   300 mg at 06/15/16 1209       Objective:   Vitals:   11/04/18 0859  BP: 110/80  Pulse: 78  Resp: 16  Temp: 98 F (36.7 C)  SpO2: 98%     Wt Readings from Last 3 Encounters:  11/04/18 161 lb (73 kg)  08/19/18 160 lb 2 oz (72.6 kg)  06/18/18 164 lb (74.4 kg)    Physical Exam:   General Appearance:  Patient sitting comfortably on examination table. Conversational. Kermit Balo self-historian. In no acute distress. Afebrile.   Head:  Normocephalic, without obvious  abnormality, atraumatic  Eyes:  PERRL, conjunctiva/corneas clear, EOM's intact  Ears:  Bilateral ear canals WNL. No erythema or edema. No discharge/drainage. Left TM with scar tissue at 6 o'clock position. Good light reflex. Pearly TM coloration. No erythema or injection. Right TM WNL. Good light reflex. Pearly TM coloration. No erythema or injection.  Nose: Nares normal, septum midline. Nasal mucosa with bilateral edema and scant clear rhinorrhea. No sinus tenderness with percussion/palpation.  Throat: Lips, mucosa, and tongue normal; teeth and gums normal. Throat reveals no erythema. Tonsils with no enlargement or exudate.  Neck: Supple, symmetrical, trachea midline, mild bilateral anterior and posterior cervical lymphadenopathy.  Lungs:   Clear to auscultation bilaterally, respirations unlabored. Good aeration. No wheezing, rales, rhonchi, or crackles. Deep inspiration triggers tight/dry cough.  Heart:  Regular rate and rhythm, S1 and S2 normal, no murmur, rub, or gallop  Extremities: Extremities normal, atraumatic, no cyanosis or edema  Pulses: 2+ and symmetric  Skin: Skin color, texture, turgor normal, no rashes or lesions  Lymph nodes: Cervical, supraclavicular, and axillary nodes normal  Neurologic: Normal  Assessment & Plan:    Exam findings, diagnosis etiology and medication use and indications reviewed with patient. Follow-Up and discharge instructions provided. No emergent/urgent issues found on exam.  Patient education was provided.   Patient verbalized understanding of information provided and agrees with plan of care (POC), all questions answered. The patient is advised to call or return to clinic if condition does not see an improvement in symptoms, or to seek the care of the closest emergency department if condition worsens with the below plan.   Negative rapid flu test.   1. Flu-like symptoms  - Baloxavir Marboxil,80 MG Dose, (XOFLUZA) 2 x 40 MG TBPK; Take 80 mg by  mouth once for 1 dose.  Dispense: 1 each; Refill: 0 - HYDROcodone-homatropine (HYCODAN) 5-1.5 MG/5ML syrup; Take 5 mLs by mouth every 8 (eight) hours as needed for cough.  Dispense: 120 mL; Refill: 0  Patient with two day history (24 hours) of flu-like symptoms. Reports fever, chills, sweats, headache, and URI symptoms (primarily nasal congestion and cough). VSS. Afebrile (last antipyretic, Tylenol, yesterday evening). In no acute distress. Clear lung sounds. No indication of current asthma exacerbation. 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 Did receive this season's influenza vaccination. At this time, believe safe to treat patient as uncomplicated influenza. Advised rest, OTC symptomatic relief, and use of albuterol inhaler for cough. Prescribed single dose Xofluza (and Hycodan cough syrup for symptom relief). Gave patient work excuse note for one week; discussed contagiousness precautions. Advised patient seek re-evaluation in one week, sooner with worsening symptoms, for possible flu complications. Patient agrees with plan.    Darlin Priestly, MHS, PA-C Montey Hora, MHS, PA-C Advanced Practice Provider [facility]  [address] [facility], [place], [place] [address] (p):  [phone] [email or web address]

## 2018-11-06 ENCOUNTER — Ambulatory Visit: Payer: Self-pay | Admitting: Family

## 2018-11-06 ENCOUNTER — Telehealth: Payer: Self-pay | Admitting: Emergency Medicine

## 2018-11-06 NOTE — Telephone Encounter (Signed)
Left message following up on visit with [facility] 

## 2018-11-12 ENCOUNTER — Ambulatory Visit: Payer: 59 | Admitting: Family Medicine

## 2018-11-12 ENCOUNTER — Encounter: Payer: Self-pay | Admitting: Family Medicine

## 2018-11-12 ENCOUNTER — Ambulatory Visit (INDEPENDENT_AMBULATORY_CARE_PROVIDER_SITE_OTHER): Payer: 59

## 2018-11-12 VITALS — BP 150/98 | HR 88 | Temp 98.1°F | Resp 16 | Ht 62.0 in | Wt 165.6 lb

## 2018-11-12 DIAGNOSIS — R0989 Other specified symptoms and signs involving the circulatory and respiratory systems: Secondary | ICD-10-CM

## 2018-11-12 DIAGNOSIS — R059 Cough, unspecified: Secondary | ICD-10-CM

## 2018-11-12 DIAGNOSIS — R062 Wheezing: Secondary | ICD-10-CM | POA: Diagnosis not present

## 2018-11-12 DIAGNOSIS — J45909 Unspecified asthma, uncomplicated: Secondary | ICD-10-CM | POA: Diagnosis not present

## 2018-11-12 DIAGNOSIS — J4541 Moderate persistent asthma with (acute) exacerbation: Secondary | ICD-10-CM

## 2018-11-12 DIAGNOSIS — R05 Cough: Secondary | ICD-10-CM

## 2018-11-12 MED ORDER — ALBUTEROL SULFATE (2.5 MG/3ML) 0.083% IN NEBU
2.5000 mg | INHALATION_SOLUTION | Freq: Once | RESPIRATORY_TRACT | Status: AC
  Administered 2018-11-12: 2.5 mg via RESPIRATORY_TRACT

## 2018-11-12 MED ORDER — METHYLPREDNISOLONE ACETATE 80 MG/ML IJ SUSP
80.0000 mg | Freq: Once | INTRAMUSCULAR | Status: AC
  Administered 2018-11-12: 80 mg via INTRAMUSCULAR

## 2018-11-12 MED ORDER — HYDROCOD POLST-CPM POLST ER 10-8 MG/5ML PO SUER: 5.0000 mL | Freq: Two times a day (BID) | ORAL | 0 refills | Status: DC | PRN

## 2018-11-12 MED ORDER — DOXYCYCLINE HYCLATE 100 MG PO TABS: 100.0000 mg | ORAL_TABLET | Freq: Two times a day (BID) | ORAL | 0 refills | Status: DC

## 2018-11-12 MED ORDER — IPRATROPIUM-ALBUTEROL 0.5-2.5 (3) MG/3ML IN SOLN: 3.0000 mL | RESPIRATORY_TRACT | 1 refills | Status: DC | PRN

## 2018-11-12 MED ORDER — IPRATROPIUM BROMIDE 0.02 % IN SOLN
0.5000 mg | Freq: Once | RESPIRATORY_TRACT | Status: AC
  Administered 2018-11-12: 0.5 mg via RESPIRATORY_TRACT

## 2018-11-12 NOTE — Patient Instructions (Signed)
Use the duo neb solution every 4 hours as needed  You may use plain albuterol solution instead of duo neb if you would like to do this.

## 2018-11-12 NOTE — Progress Notes (Signed)
Subjective:    Patient ID: Bridget Peters, female    DOB: [birth year], 48 y.o.   MRN: [id number]  HPI   Patient presents to clinic complaining of cough, congestion, fever and chills since last week.  Patient was here with similar symptoms on 11/04/2018.  Flu swab in clinic was negative at that time, but symptoms were felt to be flulike, so Tamiflu prescribed.  Patient states she took 2 doses of the Tamiflu, but these made her very sick to her stomach so she stopped taking.  Patient states she has felt over congestion in chest and wheezing over the past 4 to 5 days.  States she has been using her albuterol nebulizer at least 2-3 times daily.  Patient Active Problem List   Diagnosis Date Noted  . PMB (postmenopausal bleeding) 11/04/2018  . Routine physical examination 05/20/2018  . Vaginal bleeding 05/20/2018  . Aortic atherosclerosis ([place]) 04/15/2018  . Sinus congestion 02/13/2018  . Bronchitis 08/20/2017  . Osteopenia 05/14/2017  . OSA (obstructive sleep apnea) 04/24/2017  . Elevated hemoglobin ([place]) 03/09/2017  . Fatty liver 01/17/2017  . Fatigue 01/01/2017  . HTN (hypertension) 01/01/2017  . 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 Depression 07/11/2016  . Allergic reaction 06/13/2016  . Hypercholesteremia 04/26/2016  . Screening mammogram, encounter for 07/14/2015  . Hypothyroidism 07/14/2015  . Diabetes mellitus type 2, controlled, without complications ([place]) 54/00/8676  . Multiple environmental allergies 07/14/2015  . Asthma, moderate persistent 07/14/2015   Review of Systems  Constitutional: + chills, fatigue and fever.  HENT: Negative for congestion, ear pain, sinus pain and sore throat.   Eyes: Negative.   Respiratory: + cough, chest congestion, shortness of breath and wheezing.   Cardiovascular: Negative for chest pain, palpitations and leg swelling.  Gastrointestinal: Negative for abdominal pain, diarrhea, nausea and vomiting.  Genitourinary: Negative for dysuria,  frequency and urgency.  Musculoskeletal: Negative for arthralgias and myalgias.  Skin: Negative for color change, pallor and rash.  Neurological: Negative for syncope, light-headedness and headaches.  Psychiatric/Behavioral: The patient is not nervous/anxious.       Objective:   Physical Exam Vitals signs and nursing note reviewed.  Constitutional:      Appearance: She is ill-appearing (appears tired). She is not toxic-appearing or diaphoretic.  HENT:     Head: Normocephalic and atraumatic.     Ears:     Comments: Fullness bilat TMs    Nose: Congestion and rhinorrhea present.     Mouth/Throat:     Mouth: Mucous membranes are moist.     Pharynx: No oropharyngeal exudate or posterior oropharyngeal erythema.  Eyes:     General: No scleral icterus.       Right eye: No discharge.        Left eye: No discharge.     Extraocular Movements: Extraocular movements intact.     Conjunctiva/sclera: Conjunctivae normal.     Pupils: Pupils are equal, round, and reactive to light.  Neck:     Musculoskeletal: Neck supple. No neck rigidity.  Cardiovascular:     Rate and Rhythm: Normal rate and regular rhythm.  Pulmonary:     Effort: Pulmonary effort is normal. No respiratory distress.     Breath sounds: No stridor. Wheezing and rhonchi (Left lower more prominent and scattered throughout) present. No rales.  Lymphadenopathy:     Cervical: No cervical adenopathy.  Skin:    General: Skin is warm and dry.     Coloration: Skin is not pale.  Neurological:  Mental Status: She is alert and oriented to person, place, and time.     Gait: Gait normal.  Psychiatric:        Mood and Affect: Mood normal.        Behavior: Behavior normal.     Vitals:   11/12/18 1008  BP: (!) 150/98  Pulse: 88  Resp: 16  Temp: 98.1 F (36.7 C)  SpO2: 97%      Assessment & Plan:   Moderate persistent asthma with exacerbation, cough, rhonchi, wheezes- patient given DuoNeb x1 in clinic and IM steroid 80 mg x  1.  I sent in DuoNeb solution patient can use in her nebulizer, she can alternate DuoNeb with plain albuterol or just do duo nebs at home.  We will get chest x-ray in clinic due to lung sounds and asthma history.  She will take doxycycline course twice daily for 10 days to cover respiratory infection.  Patient prescribed Tussionex cough syrup to help reduce cough.  Advised that Tussionex cough syrup can cause drowsiness, do not take this prior to driving.  Out of work note given for the rest of this week to allow patient time to rest and recover.  Administrations This Visit    albuterol (PROVENTIL) (2.5 MG/3ML) 0.083% nebulizer solution 2.5 mg    Admin Date 11/12/2018 Action Given Dose 2.5 mg Route Nebulization Administered By Neta Ehlers, RMA       ipratropium (ATROVENT) nebulizer solution 0.5 mg    Admin Date 11/12/2018 Action Given Dose 0.5 mg Route Nebulization Administered By Neta Ehlers, RMA       methylPREDNISolone acetate (DEPO-MEDROL) injection 80 mg    Admin Date 11/12/2018 Action Given Dose 80 mg Route Intramuscular Administered By Neta Ehlers, RMA         Offered follow-up appointment in 1 week to be sure she is improving, patient declines but states that she will definitely call and let us know if she is not getting better.

## 2018-11-21 ENCOUNTER — Other Ambulatory Visit: Payer: Self-pay

## 2018-11-21 ENCOUNTER — Encounter: Payer: Self-pay | Admitting: Family

## 2018-11-21 ENCOUNTER — Other Ambulatory Visit: Payer: Self-pay | Admitting: Family

## 2018-11-21 DIAGNOSIS — F321 Major depressive disorder, single episode, moderate: Secondary | ICD-10-CM

## 2018-11-21 DIAGNOSIS — R5383 Other fatigue: Secondary | ICD-10-CM

## 2018-11-25 ENCOUNTER — Other Ambulatory Visit: Payer: Self-pay

## 2018-11-25 MED ORDER — SERTRALINE HCL 100 MG PO TABS: 100.0000 mg | ORAL_TABLET | Freq: Every day | ORAL | 3 refills | Status: DC

## 2018-12-11 DIAGNOSIS — J4550 Severe persistent asthma, uncomplicated: Secondary | ICD-10-CM | POA: Diagnosis not present

## 2019-01-01 ENCOUNTER — Encounter: Payer: Self-pay | Admitting: Family

## 2019-01-01 ENCOUNTER — Telehealth (INDEPENDENT_AMBULATORY_CARE_PROVIDER_SITE_OTHER): Payer: 59 | Admitting: Family

## 2019-01-01 DIAGNOSIS — J40 Bronchitis, not specified as acute or chronic: Secondary | ICD-10-CM | POA: Diagnosis not present

## 2019-01-01 MED ORDER — OSELTAMIVIR PHOSPHATE 75 MG PO CAPS: 75.0000 mg | ORAL_CAPSULE | Freq: Two times a day (BID) | ORAL | 0 refills | Status: DC

## 2019-01-01 MED ORDER — HYDROCODONE-HOMATROPINE 5-1.5 MG/5ML PO SYRP: 5.0000 mL | ORAL_SOLUTION | Freq: Every evening | ORAL | 0 refills | Status: DC | PRN

## 2019-01-01 NOTE — Patient Instructions (Signed)
As discussed, unfortunately cannot test for influenza or COVID-19 at this time.  Based on your  symptoms I certainly have concern.  Since you do not feel any obvious body aches and nor feel like flu as had earlier this year, you may hold the Tamiflu however if this develops over the next day or 2, you are within the window to start Tamiflu.  Tamiflu is at your pharmacy.  I have also sent in Hycodan cough syrup  please take cough medication at night only as needed. As we discussed, I do not recommend dosing throughout the day as coughing is a protective mechanism . It also helps to break up thick mucous.  Do not take cough suppressants with alcohol as can lead to trouble breathing. Advise caution if taking cough suppressant and operating machinery ( i.e driving a car) as you may feel very tired.   As discussed, it is very important to use your nebulizers, every 6-8 hours consistently. If you develop increased shortness of breath, respiratory distress, please call 911 or go the nearest emergency room.  And of course notify them of your concern for the virus.  In regards to work, as discussed today, I would advise you to stay home for 7 days since symptom onset and also to not return to work until you have 3 days of symptoms resolved.  Please avoid close contact with loved ones and essentially self quarantine while we navigate this pandemic.  Please let me know if you need anything at all, my best. Joycelyn Schmid

## 2019-01-01 NOTE — Progress Notes (Signed)
This visit type was conducted due to national recommendations for restrictions regarding the COVID-19 pandemic (e.g. social distancing).  This format is felt to be most appropriate for this patient at this time.  All issues noted in this document were discussed and addressed.  No physical exam was performed (except for noted visual exam findings with Video Visits).  Virtual Visit via Video Note  I connected with@ on 01/03/19 at 11:00 AM EDT by a video enabled telemedicine application and verified that I am speaking with the correct person using two identifiers.  Location patient: home Location provider:work  Persons participating in the virtual visit: patient, provider  I discussed the limitations of evaluation and management by telemedicine and the availability of in person appointments. The patient expressed understanding and agreed to proceed.   HPI:  Chief complaint of shortness of breath x1 day, unchanged.  She feels well when she is being still, however shortness of breath will increase with prolonged activity.  She endorses a dry cough, fatigue, chills, clear nasal congestion.  She does not feel she needs to go to emergency room nor does she want to go to emergency room for fear of contracting virus. She feels tactile warmth (unable to find thermometer at home) and body aches.  Her breathing improved with treatment of nebulized albuterol at home.  She states does not really feel like her "asthma" or if she does not feel as if she has influenza.  She states she had influenza in February of this year he does not feel the body aches are similar.  She denies any chest pain, headache, sinus tenderness, ear pain, nausea, vomiting.  She has a history of seasonal allergies. Husband is home with her.   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  She did go to the grocery store this weekend. Feels like cough syrup would be  helpful. 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  ROS: See pertinent positives and negatives per HPI.  Past Medical History:  Diagnosis Date  . Allergy    Seasonal  . Asthma   . Depression   . Diabetes mellitus without complication ([place])    Pt takes Metformin.  Marland Kitchen Hypertension   . Pneumonia   . Shortness of breath   . Sleep apnea   . Thyroid disease     Past Surgical History:  Procedure Laterality Date  . BREAST SURGERY  01/2013   Breast Reduction   . NASAL SINUS SURGERY    . NASAL SINUS SURGERY  02/2014  . REDUCTION MAMMAPLASTY Bilateral 2014    Family History  Problem Relation Age of Onset  . Hypertension Mother   . Endometrial cancer Mother 33  . Hypertension Father   . Colon polyps Father   . Cancer Maternal Aunt        Breast Cancer  . Breast cancer Maternal Aunt   . Breast cancer Paternal Aunt        Breast Cancer  . Cancer Maternal Grandmother 14       Bladder Cancer  . Stroke Maternal Grandmother   . Breast cancer Maternal Grandmother 60  . Cancer Maternal Grandfather 34       Esophagus Cancer  . Colon cancer Neg Hx   . Ovarian cancer Neg Hx     SOCIAL HX: Non-smoker  Current Outpatient Medications:  .  albuterol (PROVENTIL HFA;VENTOLIN HFA) 108 (90 Base) MCG/ACT inhaler, Inhale 2 puffs into the lungs every 4 (four) hours as needed for wheezing or shortness of breath., Disp: 1 Inhaler, Rfl: 6 .  albuterol (PROVENTIL) (2.5 MG/3ML) 0.083% nebulizer solution, Take 3 mLs (2.5 mg total) by nebulization every 6 (six) hours as needed for wheezing or shortness of breath., Disp: 75 mL, Rfl: 12 .  cetirizine (ZYRTEC ALLERGY) 10 MG tablet, Take 1 tablet (10 mg total) by mouth daily., Disp: 30 tablet, Rfl: 6 .  chlorpheniramine-HYDROcodone (TUSSIONEX PENNKINETIC ER) 10-8 MG/5ML SUER, Take 5 mLs by mouth every 12 (twelve) hours as needed., Disp: 140 mL, Rfl: 0 .  doxycycline (VIBRA-TABS) 100 MG tablet,  Take 1 tablet (100 mg total) by mouth 2 (two) times daily., Disp: 20 tablet, Rfl: 0 .  EPIPEN 2-PAK 0.3 MG/0.3ML SOAJ injection, , Disp: , Rfl: 11 .  fexofenadine (ALLEGRA) 180 MG tablet, Take 1 tablet (180 mg total) by mouth daily., Disp: 30 tablet, Rfl: 0 .  fluticasone (VERAMYST) 27.5 MCG/SPRAY nasal spray, Place 2 sprays into the nose daily., Disp: 10 g, Rfl: 12 .  fluticasone furoate-vilanterol (BREO ELLIPTA) 200-25 MCG/INH AEPB, Inhale 1 puff into the lungs daily., Disp: 60 each, Rfl: 5 .  gabapentin (NEURONTIN) 100 MG capsule, Take 1 capsule (100 mg total) by mouth at bedtime., Disp: 90 capsule, Rfl: 3 .  HYDROcodone-homatropine (HYCODAN) 5-1.5 MG/5ML syrup, Take 5 mLs by mouth at bedtime as needed for cough., Disp: 30 mL, Rfl: 0 .  INVOKANA 300 MG TABS tablet, TAKE 1 TABLET BY MOUTH DAILY., Disp: 90 tablet, Rfl: 3 .  ipratropium-albuterol (DUONEB) 0.5-2.5 (3) MG/3ML SOLN, Take 3 mLs by nebulization every 4 (four) hours as needed., Disp: 120 mL, Rfl: 1 .  levothyroxine (SYNTHROID, LEVOTHROID) 75 MCG tablet, TAKE 1 TABLET BY MOUTH DAILY., Disp: 90 tablet, Rfl: 1 .  Mepolizumab (NUCALA) 100 MG SOLR, Inject 100 mg into the skin every 30 (thirty) days., Disp: 1 each, Rfl: 5 .  oseltamivir (TAMIFLU) 75 MG capsule, Take 1 capsule (75 mg total) by mouth 2 (two) times daily., Disp: 10 capsule, Rfl: 0 .  pantoprazole (PROTONIX) 40 MG tablet, TAKE 1 TABLET BY MOUTH DAILY., Disp: 30 tablet, Rfl: 5 .  sertraline (ZOLOFT) 100 MG tablet, Take 1 tablet (100 mg total) by mouth daily., Disp: 30 tablet, Rfl: 3 .  silver sulfADIAZINE (SILVADENE) 1 % cream, Apply 1 application topically daily., Disp: 50 g, Rfl: 0  Current Facility-Administered Medications:  Marland Kitchen  Mepolizumab SOLR 100 mg, 100 mg, Subcutaneous, Q28 days, Mortimer Fries, Kurian, MD, 100 mg at 08/03/17 0923 .  omalizumab Arvid Right) injection 300 mg, 300 mg, Subcutaneous, Q14 Days, Kasa, Kurian, MD, 300 mg at 06/15/16 1209  EXAM:  VITALS per patient if  applicable:  GENERAL: alert, oriented, appears well and in no acute distress  HEENT: atraumatic, conjunttiva clear, no obvious abnormalities on inspection of external nose and ears  NECK: normal movements of the head and neck  LUNGS: on inspection no signs of respiratory distress, breathing rate appears normal, no obvious gross SOB, gasping or wheezing  CV: no obvious cyanosis  MS: moves all visible extremities without noticeable abnormality  PSYCH/NEURO: pleasant and cooperative, no obvious depression or anxiety, speech and thought processing grossly intact  ASSESSMENT AND PLAN:  Discussed the following assessment and plan:  Bronchitis - Plan: oseltamivir (TAMIFLU) 75 MG capsule, DISCONTINUED: HYDROcodone-homatropine (HYCODAN) 5-1.5 MG/5ML syrup     I discussed the assessment  and treatment plan with the patient. The patient was provided an opportunity to ask questions and all were answered. The patient agreed with the plan and demonstrated an understanding of the instructions.   The patient was advised to call back or seek an in-person evaluation if the symptoms worsen or if the condition fails to improve as anticipated.  I provided 15 minutes of non-face-to-face time during this encounter.   Mable Paris, FNP

## 2019-01-02 ENCOUNTER — Telehealth: Payer: Self-pay | Admitting: Family

## 2019-01-02 DIAGNOSIS — J3089 Other allergic rhinitis: Secondary | ICD-10-CM | POA: Diagnosis not present

## 2019-01-02 DIAGNOSIS — R091 Pleurisy: Secondary | ICD-10-CM | POA: Diagnosis not present

## 2019-01-02 DIAGNOSIS — J4550 Severe persistent asthma, uncomplicated: Secondary | ICD-10-CM | POA: Diagnosis not present

## 2019-01-02 DIAGNOSIS — H1013 Acute atopic conjunctivitis, bilateral: Secondary | ICD-10-CM | POA: Diagnosis not present

## 2019-01-02 DIAGNOSIS — J3081 Allergic rhinitis due to animal (cat) (dog) hair and dander: Secondary | ICD-10-CM | POA: Diagnosis not present

## 2019-01-02 DIAGNOSIS — J329 Chronic sinusitis, unspecified: Secondary | ICD-10-CM | POA: Diagnosis not present

## 2019-01-02 DIAGNOSIS — Z91018 Allergy to other foods: Secondary | ICD-10-CM | POA: Diagnosis not present

## 2019-01-02 DIAGNOSIS — J301 Allergic rhinitis due to pollen: Secondary | ICD-10-CM | POA: Diagnosis not present

## 2019-01-02 DIAGNOSIS — J40 Bronchitis, not specified as acute or chronic: Secondary | ICD-10-CM

## 2019-01-02 NOTE — Telephone Encounter (Signed)
Copied from [place] [id number]. Topic: Quick Communication - Rx Refill/Question >> Jan 02, 2019 10:57 AM Rayann Heman wrote: Medication: HYDROcodone-homatropine (HYCODAN) 5-1.5 MG/5ML syrup [[id number]] Pharmacy called and stated that this medication was sent over by fax but was unable to process because of regulations. Pharmacy would like [place] to send this over electronically. Please advise

## 2019-01-02 NOTE — Telephone Encounter (Signed)
Copied from [place] [id number]. Topic: Quick Communication - Rx Refill/Question >> Jan 02, 2019 10:57 AM Rayann Heman wrote: Medication: HYDROcodone-homatropine (HYCODAN) 5-1.5 MG/5ML syrup [[id number]] Pharmacy called and stated that this medication was sent over by fax but was unable to process because of regulations. Pharmacy would like [place] to send this over electronically. Please advise

## 2019-01-03 ENCOUNTER — Other Ambulatory Visit: Payer: Self-pay

## 2019-01-03 ENCOUNTER — Emergency Department
Admission: EM | Admit: 2019-01-03 | Discharge: 2019-01-03 | Disposition: A | Discharge status: Home / Self Care | Payer: 59 | Attending: Emergency Medicine | Admitting: Emergency Medicine

## 2019-01-03 ENCOUNTER — Encounter: Payer: Self-pay | Admitting: Emergency Medicine

## 2019-01-03 ENCOUNTER — Emergency Department: Payer: 59

## 2019-01-03 ENCOUNTER — Encounter: Payer: Self-pay | Admitting: Family

## 2019-01-03 DIAGNOSIS — R0602 Shortness of breath: Secondary | ICD-10-CM | POA: Diagnosis not present

## 2019-01-03 DIAGNOSIS — J069 Acute upper respiratory infection, unspecified: Secondary | ICD-10-CM | POA: Insufficient documentation

## 2019-01-03 DIAGNOSIS — I10 Essential (primary) hypertension: Secondary | ICD-10-CM | POA: Insufficient documentation

## 2019-01-03 DIAGNOSIS — E119 Type 2 diabetes mellitus without complications: Secondary | ICD-10-CM | POA: Diagnosis not present

## 2019-01-03 DIAGNOSIS — Z79899 Other long term (current) drug therapy: Secondary | ICD-10-CM | POA: Insufficient documentation

## 2019-01-03 DIAGNOSIS — J45909 Unspecified asthma, uncomplicated: Secondary | ICD-10-CM | POA: Diagnosis not present

## 2019-01-03 DIAGNOSIS — J988 Other specified respiratory disorders: Secondary | ICD-10-CM

## 2019-01-03 DIAGNOSIS — R05 Cough: Secondary | ICD-10-CM | POA: Diagnosis not present

## 2019-01-03 DIAGNOSIS — B9789 Other viral agents as the cause of diseases classified elsewhere: Secondary | ICD-10-CM

## 2019-01-03 DIAGNOSIS — E039 Hypothyroidism, unspecified: Secondary | ICD-10-CM | POA: Diagnosis not present

## 2019-01-03 MED ORDER — HYDROCODONE-HOMATROPINE 5-1.5 MG/5ML PO SYRP: 5.0000 mL | ORAL_SOLUTION | Freq: Every evening | ORAL | 0 refills | Status: DC | PRN

## 2019-01-03 MED ORDER — HYDROCODONE-HOMATROPINE 5-1.5 MG/5ML PO SYRP: 5.0000 mL | ORAL_SOLUTION | Freq: Four times a day (QID) | ORAL | 0 refills | Status: DC | PRN

## 2019-01-03 NOTE — ED Notes (Signed)
Droplet contact isolation being used.

## 2019-01-03 NOTE — ED Provider Notes (Signed)
[facility] Emergency Department Provider Note  ____________________________________________   First MD Initiated Contact with Patient 01/03/19 1105     (approximate)  I have reviewed the triage vital signs and the nursing notes.   HISTORY  Chief Complaint Shortness of Breath     HPI Bridget Peters is a 48 y.o. female whose medical history includes chronic asthma that is generally well controlled.  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  She presents today for evaluation of 2 to 3 days of gradually worsening shortness of breath, intermittent mild fever, general malaise, body aches, mild headache, increased work of breathing, and some pain in her chest with deep inspiration.  She has not been doing any travel and has not come into contact with any patient is known to have COVID-19 but she does have contact with the public/patients by virtue of her job.  After her symptoms developed 3 business days ago she was advised to stay home.  She has been in frequent contact with her primary care provider giving updates but by this morning she felt severely short of breath when she was getting out of bed and walking around so her doctor advised her to come to the emergency department.  She reports that her symptoms are moderate to severe.  Her temperature at home has been as high as 100.5 but is currently afebrile.  She feels like she has a difficult time catching her breath with exertion.  She has used her nebulizer with albuterol at home but it does not seem to be helping.  She has had a mild and nonproductive cough.  She admits to being very anxious and scared and is tearful during my evaluation.  She denies nausea, vomiting, abdominal pain, diarrhea, and dysuria.  She has no history of blood clots in the legs of the lungs, no unilateral leg swelling, does not use exogenous estrogen, has not been traveling at all and particularly not to any high risk COVID-19  locations, has had no recent surgeries, and does not have active cancer.  She has no tobacco use history and no history of heart problems.   Past Medical History:  Diagnosis Date   Allergy    Seasonal   Asthma    Depression    Diabetes mellitus without complication ([place])    Pt takes Metformin.   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   Depression 07/11/2016   Allergic reaction 06/13/2016   Hypercholesteremia 04/26/2016   Screening mammogram, encounter for 07/14/2015   Hypothyroidism 07/14/2015   Diabetes mellitus type 2, controlled, without complications ([place]) 91/47/8295   Multiple environmental allergies 07/14/2015   Asthma, moderate persistent 07/14/2015    Past Surgical History:  Procedure Laterality Date   BREAST SURGERY  01/2013   Breast Reduction    NASAL SINUS SURGERY     NASAL SINUS SURGERY  02/2014   REDUCTION MAMMAPLASTY Bilateral 2014  Prior to Admission medications   Medication Sig Start Date End Date Taking? Authorizing Provider  albuterol (PROVENTIL HFA;VENTOLIN HFA) 108 (90 Base) MCG/ACT inhaler Inhale 2 puffs into the lungs every 4 (four) hours as needed for wheezing or shortness of breath. 11/01/18   Withrow, Elyse Jarvis, FNP  albuterol (PROVENTIL) (2.5 MG/3ML) 0.083% nebulizer solution Take 3 mLs (2.5 mg total) by nebulization every 6 (six) hours as needed for wheezing or shortness of breath.  11/01/18   Withrow, Elyse Jarvis, FNP  cetirizine (ZYRTEC ALLERGY) 10 MG tablet Take 1 tablet (10 mg total) by mouth daily. 02/19/18   Flora Lipps, MD  chlorpheniramine-HYDROcodone (TUSSIONEX PENNKINETIC ER) 10-8 MG/5ML SUER Take 5 mLs by mouth every 12 (twelve) hours as needed. 11/12/18   Jodelle Green, FNP  doxycycline (VIBRA-TABS) 100 MG tablet Take 1 tablet (100 mg total) by mouth 2 (two) times daily. 11/12/18   Jodelle Green, FNP  EPIPEN 2-PAK 0.3 MG/0.3ML SOAJ injection  05/04/17   [provider]  fexofenadine (ALLEGRA) 180 MG tablet Take 1 tablet (180 mg total) by mouth daily. 12/18/17   Flora Lipps, MD  fluticasone (VERAMYST) 27.5 MCG/SPRAY nasal spray Place 2 sprays into the nose daily. 08/06/15   Flora Lipps, MD  fluticasone furoate-vilanterol (BREO ELLIPTA) 200-25 MCG/INH AEPB Inhale 1 puff into the lungs daily. 02/19/18   Flora Lipps, MD  gabapentin (NEURONTIN) 100 MG capsule Take 1 capsule (100 mg total) by mouth at bedtime. 08/19/18   Burnard Hawthorne, FNP  HYDROcodone-homatropine (HYCODAN) 5-1.5 MG/5ML syrup Take 5 mLs by mouth at bedtime as needed for cough. 01/03/19   Burnard Hawthorne, FNP  INVOKANA 300 MG TABS tablet TAKE 1 TABLET BY MOUTH DAILY. 10/08/18   Burnard Hawthorne, FNP  ipratropium-albuterol (DUONEB) 0.5-2.5 (3) MG/3ML SOLN Take 3 mLs by nebulization every 4 (four) hours as needed. 11/12/18   Jodelle Green, FNP  levothyroxine (SYNTHROID, LEVOTHROID) 75 MCG tablet TAKE 1 TABLET BY MOUTH DAILY. 10/04/18   Burnard Hawthorne, FNP  Mepolizumab (NUCALA) 100 MG SOLR Inject 100 mg into the skin every 30 (thirty) days. 10/20/16   Flora Lipps, MD  oseltamivir (TAMIFLU) 75 MG capsule Take 1 capsule (75 mg total) by mouth 2 (two) times daily. 01/01/19   Burnard Hawthorne, FNP  pantoprazole (PROTONIX) 40 MG tablet TAKE 1 TABLET BY MOUTH DAILY. 10/04/18   Flora Lipps, MD  sertraline (ZOLOFT) 100 MG tablet Take 1 tablet (100 mg total) by mouth daily. 11/25/18   Burnard Hawthorne,  FNP  silver sulfADIAZINE (SILVADENE) 1 % cream Apply 1 application topically daily. 05/02/18   Brunetta Jeans, PA-C    Allergies Milk-related compounds; Shellfish allergy; Wheat bran; Xolair [omalizumab]; and Isovue [iopamidol]  Family History  Problem Relation Age of Onset   Hypertension Mother    Endometrial cancer Mother 67   Hypertension Father    Colon polyps Father    Cancer Maternal Aunt        Breast Cancer   Breast cancer Maternal Aunt    Breast cancer Paternal Aunt        Breast Cancer   Cancer Maternal Grandmother 33       Bladder Cancer   Stroke Maternal Grandmother    Breast cancer Maternal Grandmother 109   Cancer Maternal Grandfather 68       Esophagus Cancer   Colon cancer Neg Hx    Ovarian cancer Neg Hx     Social History Social History   Tobacco  Use   Smoking status: Never Smoker   Smokeless tobacco: Never Used  Substance Use Topics   Alcohol use: Yes    Alcohol/week: 0.0 standard drinks   Drug use: No    Review of Systems Constitutional: Generally well-appearing, anxious but appropriate under the circumstances. Eyes: No visual changes. ENT: Mild sore throat, some nasal congestion. Cardiovascular: Some chest pain with deep inspiration, otherwise no concerns. Respiratory: Shortness of breath, mild nonproductive cough, worse with exertion, all as described above Gastrointestinal: Denies abdominal pain, nausea, vomiting, and diarrhea Genitourinary: Negative for dysuria and hematuria. Musculoskeletal: Generalized body aches, otherwise unremarkable. Integumentary: Negative for rash, abrasions, and lacerations. Neurological: Headache:  Yes . No focal weakness nor numbness. Psychiatric: No acute concerns at this time.  ____________________________________________   PHYSICAL EXAM:  VITAL SIGNS: ED Triage Vitals [01/03/19 1054]  Enc Vitals Group     BP (!) 158/109     Pulse Rate 98     Resp (!) 26     Temp 98.8 F (37.1 C)      Temp Source Oral     SpO2 99 %     Weight 68 kg (150 lb)     Height 1.575 m (5\' 2" )     Head Circumference      Peak Flow      Pain Score 8     Pain Loc      Pain Edu?      Excl. in [place]?     Constitutional: Alert and oriented.  Generally well-appearing, nontoxic in appearance. Eyes: Conjunctivae are normal.  Head: Atraumatic. Nose: No congestion/rhinnorhea. Mouth/Throat: Mucous membranes are moist. Neck: No stridor.  No meningeal signs.   Cardiovascular: No tachycardia at rest, regular rhythm. Good peripheral circulation. Grossly normal heart sounds. Respiratory: Lung sounds are clear to auscultation bilaterally with no wheezes, rales, nor rhonchi.  The patient has no accessory muscle usage and no retractions although she does have some mild tachypnea. Gastrointestinal: Soft and nontender. No distention.  Musculoskeletal: No lower extremity tenderness nor edema. No gross deformities of extremities. Neurologic:  Normal speech and language. No gross focal neurologic deficits are appreciated.  Skin:  Skin is warm, dry and intact. No rash noted. Psychiatric: Mood and affect are normal under the circumstances.  No acute/emergent concerns nor abnormalities.  Anxious and tearful but appropriate.  ____________________________________________   LABS (all labs ordered are listed, but only abnormal results are displayed)  Labs Reviewed - No data to display ____________________________________________  EKG  No indication for EKG ____________________________________________  RADIOLOGY I, Hinda Kehr, personally viewed and evaluated these images (plain radiographs) as part of my medical decision making, as well as reviewing the written report by the radiologist.  ED MD interpretation: No evidence of acute abnormality including no edema and no consolidation to suggest pneumonia.  Official radiology report(s): Dg Chest Portable 1 View  Result Date: 01/03/2019 CLINICAL DATA:   Shortness of breath with cough and fever EXAM: PORTABLE CHEST 1 VIEW COMPARISON:  November 12, 2018 FINDINGS: There is no appreciable edema or consolidation. Heart size and pulmonary vascularity are normal. No adenopathy. No bone lesions. IMPRESSION: No edema or consolidation. Electronically Signed   By: Lowella Grip III M.D.   On: 01/03/2019 11:28    ____________________________________________   PROCEDURES   Procedure(s) performed (including Critical Care):  Procedures   ____________________________________________   INITIAL IMPRESSION / MDM / [place] / ED COURSE  As part of my medical decision making, I reviewed the following data  within the [facility] notes reviewed and incorporated, Old chart reviewed, Radiograph reviewed  and Notes from prior ED visits and reviewed controlled substances database   Vinetta E Bischof was evaluated in Emergency Department on 01/03/2019 for the symptoms described in the history of present illness. She was evaluated in the context of the global COVID-19 pandemic, which necessitated consideration that the patient might be at risk for infection with the SARS-CoV-2 virus that causes COVID-19. Institutional protocols and algorithms that pertain to the evaluation of patients at risk for COVID-19 are in a state of rapid change based on information released by regulatory bodies including the CDC and federal and state organizations. These policies and algorithms were followed during the patient's care in the ED.  ---------------------    Differential diagnosis includes, but is not limited to, nonspecific viral respiratory illness, COVID-19, pneumonia, pulmonary embolism, ACS.  The patient is low risk for ACS based on her heart score and she is PERC negative and has a well score for PE is 0.  I suspect that her symptoms are due to a nonspecific viral illness that is likely exacerbated both by her chronic well-controlled  asthma but also by the current anxiety over the COVID-19 pandemic.  However it is a very real possibility that she may have COVID-19 given her contact with patients by virtue of her job.  Her lung sounds are clear although she is mildly tachypneic.  She was ambulated with a pulse oximeter and did not drop below 100% although her heart rate went up a little bit.  She clearly has signs and symptoms of a viral respiratory illness but at this time she does not meet any of the criteria we have been given for testing for COVID-19, nor does she meet criteria for admission to the hospital.  I provided reassurance that the best I could and reiterated the self quarantine/isolation rules and gave strict return precautions if her symptoms get worse.  She has sufficient albuterol solution at home to use in her nebulizer and she is in frequent contact with her primary care provider.  She understands and agrees with the plan.     ____________________________________________  FINAL CLINICAL IMPRESSION(S) / ED DIAGNOSES  Final diagnoses:  Viral respiratory illness  SOB (shortness of breath)     MEDICATIONS GIVEN DURING THIS VISIT:  Medications - No data to display   ED Discharge Orders    None       Note:  This document was prepared using Dragon voice recognition software and may include unintentional dictation errors.   Hinda Kehr, MD 01/03/19 1137

## 2019-01-03 NOTE — Assessment & Plan Note (Addendum)
Suspect viral illness which has exacerbated underlying asthma. Patient does not appear labored in speech.  She does not appear toxic over video.  She is not gasping for air, she is speaking in full sentences.  She has a history of asthma, seasonal allergies.  Discussed with her that [facility] is not longer testing for influenza, or COVID 19. We agreed that as long as stable we can treat her conservatively at home due to pandemic with close follow up. Emphasized using albuterol nebulizer every 6-8 hours. She will use hycodan at bedtime. Advised her to purchase a pulse oximeter, thermometer. Advised to start tamiflu within 72 hours if she felt body aches felt like influenza had earlier this year. She will let me know how she is doing and declines transport to ED at this time.  Work note provided.

## 2019-01-03 NOTE — Discharge Instructions (Signed)
As we discussed, we believe your symptoms are caused by a respiratory virus.  However, because we cannot rule out the possibility of COVID-19 at this time, we recommend that you self-quarantine at home for 14 days, or until 3 consecutive days without fever (without taking medication to make your temperature come down, such as Tylenol (acetaminophen), after your respiratory symptoms have improved, and after at least 7 days have passed since your symptoms first appeared.  You should have as minimal contact as possible with anyone else including close family as per the [facility] paperwork guidelines listed below. Avoid NSAIDs (ibuprofen, aspirin, naproxen) because it may worsen symptoms of COVID-19.  Follow-up with your doctor by phone or online as needed and return immediately to the emergency department or call 911 only if you develop new or worsening symptoms that concern you.  You can find up-to-date information about COVID-19 in [place] by calling the [place]: [phone]. You may also call 2-1-1, or [phone], or additional resources.  You can also find information online at https://miller-white.com/, or on the Center for Disease Control (CDC) website at BeginnerSteps.be.     Person Under Monitoring Name: Bridget Peters  Location: [place] [address] [place] [address]   Infection Prevention Recommendations for Individuals Confirmed to have, or Being Evaluated for, 2019 Novel Coronavirus (COVID-19) Infection Who Receive Care at Home  Individuals who are confirmed to have, or are being evaluated for, COVID-19 should follow the prevention steps below until a healthcare provider or local or state health department says they can return to normal activities.  Stay home except to get medical care You should restrict activities outside your  home, except for getting medical care. Do not go to work, school, or public areas, and do not use public transportation or taxis.  Call ahead before visiting your doctor Before your medical appointment, call the healthcare provider and tell them that you have, or are being evaluated for, COVID-19 infection. This will help the healthcare providers office take steps to keep other people from getting infected. Ask your healthcare provider to call the local or state health department.  Monitor your symptoms Seek prompt medical attention if your illness is worsening (e.g., difficulty breathing). Before going to your medical appointment, call the healthcare provider and tell them that you have, or are being evaluated for, COVID-19 infection. Ask your healthcare provider to call the local or state health department.  Wear a facemask You should wear a facemask that covers your nose and mouth when you are in the same room with other people and when you visit a healthcare provider. People who live with or visit you should also wear a facemask while they are in the same room with you.  Separate yourself from other people in your home As much as possible, you should stay in a different room from other people in your home. Also, you should use a separate bathroom, if available.  Avoid sharing household items You should not share dishes, drinking glasses, cups, eating utensils, towels, bedding, or other items with other people in your home. After using these items, you should wash them thoroughly with soap and water.  Cover your coughs and sneezes Cover your mouth and nose with a tissue when you cough or sneeze, or you can cough or sneeze into your sleeve. Throw used tissues in a lined trash can, and immediately wash your hands with soap and water for at least 20 seconds or use an alcohol-based hand  rub.  Wash your hands Wash your hands often and thoroughly with soap and water for at least 20  seconds. You can use an alcohol-based hand sanitizer if soap and water are not available and if your hands are not visibly dirty. Avoid touching your eyes, nose, and mouth with unwashed hands.   Prevention Steps for Caregivers and Household Members of Individuals Confirmed to have, or Being Evaluated for, COVID-19 Infection Being Cared for in the Home  If you live with, or provide care at home for, a person confirmed to have, or being evaluated for, COVID-19 infection please follow these guidelines to prevent infection:  Follow healthcare providers instructions Make sure that you understand and can help the patient follow any healthcare provider instructions for all care.  Provide for the patients basic needs You should help the patient with basic needs in the home and provide support for getting groceries, prescriptions, and other personal needs.  Monitor the patients symptoms If they are getting sicker, call his or her medical provider and tell them that the patient has, or is being evaluated for, COVID-19 infection. This will help the healthcare providers office take steps to keep other people from getting infected. Ask the healthcare provider to call the local or state health department.  Limit the number of people who have contact with the patient If possible, have only one caregiver for the patient. Other household members should stay in another home or place of residence. If this is not possible, they should stay in another room, or be separated from the patient as much as possible. Use a separate bathroom, if available. Restrict visitors who do not have an essential need to be in the home.  Keep older adults, very young children, and other sick people away from the patient Keep older adults, very young children, and those who have compromised immune systems or chronic health conditions away from the patient. This includes people with chronic heart, lung, or kidney  conditions, diabetes, and cancer.  Ensure good ventilation Make sure that shared spaces in the home have good air flow, such as from an air conditioner or an opened window, weather permitting.  Wash your hands often Wash your hands often and thoroughly with soap and water for at least 20 seconds. You can use an alcohol based hand sanitizer if soap and water are not available and if your hands are not visibly dirty. Avoid touching your eyes, nose, and mouth with unwashed hands. Use disposable paper towels to dry your hands. If not available, use dedicated cloth towels and replace them when they become wet.  Wear a facemask and gloves Wear a disposable facemask at all times in the room and gloves when you touch or have contact with the patients blood, body fluids, and/or secretions or excretions, such as sweat, saliva, sputum, nasal mucus, vomit, urine, or feces.  Ensure the mask fits over your nose and mouth tightly, and do not touch it during use. Throw out disposable facemasks and gloves after using them. Do not reuse. Wash your hands immediately after removing your facemask and gloves. If your personal clothing becomes contaminated, carefully remove clothing and launder. Wash your hands after handling contaminated clothing. Place all used disposable facemasks, gloves, and other waste in a lined container before disposing them with other household waste. Remove gloves and wash your hands immediately after handling these items.  Do not share dishes, glasses, or other household items with the patient Avoid sharing household items. You should not share  dishes, drinking glasses, cups, eating utensils, towels, bedding, or other items with a patient who is confirmed to have, or being evaluated for, COVID-19 infection. After the person uses these items, you should wash them thoroughly with soap and water.  Wash laundry thoroughly Immediately remove and wash clothes or bedding that have blood, body  fluids, and/or secretions or excretions, such as sweat, saliva, sputum, nasal mucus, vomit, urine, or feces, on them. Wear gloves when handling laundry from the patient. Read and follow directions on labels of laundry or clothing items and detergent. In general, wash and dry with the warmest temperatures recommended on the label.  Clean all areas the individual has used often Clean all touchable surfaces, such as counters, tabletops, doorknobs, bathroom fixtures, toilets, phones, keyboards, tablets, and bedside tables, every day. Also, clean any surfaces that may have blood, body fluids, and/or secretions or excretions on them. Wear gloves when cleaning surfaces the patient has come in contact with. Use a diluted bleach solution (e.g., dilute bleach with 1 part bleach and 10 parts water) or a household disinfectant with a label that says EPA-registered for coronaviruses. To make a bleach solution at home, add 1 tablespoon of bleach to 1 quart (4 cups) of water. For a larger supply, add  cup of bleach to 1 gallon (16 cups) of water. Read labels of cleaning products and follow recommendations provided on product labels. Labels contain instructions for safe and effective use of the cleaning product including precautions you should take when applying the product, such as wearing gloves or eye protection and making sure you have good ventilation during use of the product. Remove gloves and wash hands immediately after cleaning.  Monitor yourself for signs and symptoms of illness Caregivers and household members are considered close contacts, should monitor their health, and will be asked to limit movement outside of the home to the extent possible. Follow the monitoring steps for close contacts listed on the symptom monitoring form.   ? If you have additional questions, contact your local health department or call the epidemiologist on call at [phone] (available 24/7). ? This guidance is subject  to change. For the most up-to-date guidance from [facility], please refer to their website: YouBlogs.pl

## 2019-01-03 NOTE — ED Notes (Signed)
Ambulated patient down hallway, saturation stayed 100%, HR 113. EDP informed. Pt does not appear to be in any distress with walking.

## 2019-01-03 NOTE — Telephone Encounter (Signed)
Call pt apologize about the Hycodan.  I have sent it over electronically to [facility].  Please let me know if any issues  How is she doing??? How is SOB?

## 2019-01-03 NOTE — Telephone Encounter (Signed)
Pt seen in Ed

## 2019-01-03 NOTE — ED Notes (Signed)
Pt alert and oriented X4, active, cooperative, pt in NAD. RR even and unlabored, color WNL.  Pt informed to return if any life threatening symptoms occur.  Discharge and followup instructions reviewed. Ambulates safely. 

## 2019-01-03 NOTE — ED Notes (Signed)
Pt c/o SOB, cough that started Tuesday night. No fever. Pt arrives wearing N95 mask. States that she works at [employer]

## 2019-01-03 NOTE — ED Triage Notes (Signed)
Pt reports has had SHOB since Tuesday and woke up today really SHOB. + cough.  Tmax 100.5 at home.  Pt hyperventilating and tearful in triage; reports she is scared. 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  Unlabored but tachypnea noted.

## 2019-01-05 ENCOUNTER — Encounter: Payer: Self-pay | Admitting: Family

## 2019-01-06 ENCOUNTER — Telehealth: Payer: Self-pay | Admitting: Internal Medicine

## 2019-01-06 ENCOUNTER — Encounter: Payer: Self-pay | Admitting: Family

## 2019-01-06 ENCOUNTER — Other Ambulatory Visit: Payer: Self-pay | Admitting: Family

## 2019-01-06 ENCOUNTER — Ambulatory Visit (INDEPENDENT_AMBULATORY_CARE_PROVIDER_SITE_OTHER): Payer: 59 | Admitting: Family

## 2019-01-06 DIAGNOSIS — J40 Bronchitis, not specified as acute or chronic: Secondary | ICD-10-CM

## 2019-01-06 MED ORDER — AZITHROMYCIN 250 MG PO TABS: ORAL_TABLET | ORAL | 0 refills | Status: DC

## 2019-01-06 NOTE — Assessment & Plan Note (Signed)
Patient is well-appearing in video this morning.  No acute respiratory distress.  She is able to speak in full sentences.  Reviewed emergency room visit with patient.  Reassured by her SaO2 at home ranging from 94 to 96%.  She declined Stat CT pulmonary to evaluate for pulmonary embolism due to SOB.  She does not feel this is necessary at this time.   Advised her to also use walking SaO2 is indicator for how she is doing.  Patient monitor this today as well.  She will call me if less than 94%. Reviewed current medications, therapy at home.  At this time I made no additional changes.  Will consult a pulmonologist, Dr. Mortimer Fries in regards to further treatment, recommendations.  FMLA paperwork completed.

## 2019-01-06 NOTE — Progress Notes (Signed)
I spoke with receptionist at Dr. Zoila Shutter office & she stated that she was making him aware of cc'd chart.

## 2019-01-06 NOTE — Telephone Encounter (Signed)
Call pt  rec'ed note from Dr Mortimer Fries  He advised zpak  He also advised strep throat culture.

## 2019-01-06 NOTE — Progress Notes (Signed)
This visit type was conducted due to national recommendations for restrictions regarding the COVID-19 pandemic (e.g. social distancing).  This format is felt to be most appropriate for this patient at this time.  All issues noted in this document were discussed and addressed.  No physical exam was performed (except for noted visual exam findings with Video Visits). Virtual Visit via Video Note  I connected with@ on 01/06/19 at 11:00 AM EDT by a video enabled telemedicine application and verified that I am speaking with the correct person using two identifiers.  Location patient: home Location provider:work  Persons participating in the virtual visit: patient, provider  I discussed the limitations of evaluation and management by telemedicine and the availability of in person appointments. The patient expressed understanding and agreed to proceed.   HPI: Complains SOB and dry cough x 6 days ,unchanged. She is not worse. Has HA, and also nauseated. No abdominal pain, vomiting, dysuria, fever, CP, exertional chest pain, congestion, constipation, le swelling. Sa02.  At home, sa02 94-96%.  Never started tamiflu ; allergic to tamiflu.    Doing breathing treatments, Duoneb, however they work for 4 hours and then SOB returns. No longer on breo; ON spriva respimat 1.69mcg; xhance nasal spray. On nucala. Last treated with doxycycline feb 2020.   Unsure when she is get back to work.  Needs FMLA papers filled out.    On protonix. No epigastric burning   Seen in the emergency room 3 days ago for suspected viral respiratory illness.  Was not tested for COVID-19. Blood pressure at that time 158/109, respiratory 26, heart rate 98, temperature 98.8 F, S PO2 99%.  Walking sa02 stayed stable.  Dr. Karma Greaser noted chest sounds clear.CXR normal in ED.  Patient did not meet criteria for testing for COVID-19 nor admission to the hospital.  ROS: See pertinent positives and negatives per HPI.  Past Medical History:   Diagnosis Date  . Allergy    Seasonal  . Asthma   . Depression   . Diabetes mellitus without complication ([place])    Pt takes Metformin.  Marland Kitchen Hypertension   . Pneumonia   . Shortness of breath   . Sleep apnea   . Thyroid disease     Past Surgical History:  Procedure Laterality Date  . BREAST SURGERY  01/2013   Breast Reduction   . NASAL SINUS SURGERY    . NASAL SINUS SURGERY  02/2014  . REDUCTION MAMMAPLASTY Bilateral 2014    Family History  Problem Relation Age of Onset  . Hypertension Mother   . Endometrial cancer Mother 77  . Hypertension Father   . Colon polyps Father   . Cancer Maternal Aunt        Breast Cancer  . Breast cancer Maternal Aunt   . Breast cancer Paternal Aunt        Breast Cancer  . Cancer Maternal Grandmother 64       Bladder Cancer  . Stroke Maternal Grandmother   . Breast cancer Maternal Grandmother 60  . Cancer Maternal Grandfather 60       Esophagus Cancer  . Colon cancer Neg Hx   . Ovarian cancer Neg Hx     SOCIAL HX: Never smoker.   Never smoker  Current Outpatient Medications:  .  Fluticasone Propionate 93 MCG/ACT EXHU, Place 1 Act into the nose daily., Disp: , Rfl:  .  Tiotropium Bromide Monohydrate 1.25 MCG/ACT AERS, Inhale 1.25 mcg into the lungs daily., Disp: , Rfl:  .  albuterol (PROVENTIL HFA;VENTOLIN HFA) 108 (90 Base) MCG/ACT inhaler, Inhale 2 puffs into the lungs every 4 (four) hours as needed for wheezing or shortness of breath., Disp: 1 Inhaler, Rfl: 6 .  albuterol (PROVENTIL) (2.5 MG/3ML) 0.083% nebulizer solution, Take 3 mLs (2.5 mg total) by nebulization every 6 (six) hours as needed for wheezing or shortness of breath., Disp: 75 mL, Rfl: 12 .  cetirizine (ZYRTEC ALLERGY) 10 MG tablet, Take 1 tablet (10 mg total) by mouth daily., Disp: 30 tablet, Rfl: 6 .  chlorpheniramine-HYDROcodone (TUSSIONEX PENNKINETIC ER) 10-8 MG/5ML SUER, Take 5 mLs by mouth every 12 (twelve) hours as needed., Disp: 140 mL, Rfl: 0 .  EPIPEN 2-PAK 0.3  MG/0.3ML SOAJ injection, , Disp: , Rfl: 11 .  fexofenadine (ALLEGRA) 180 MG tablet, Take 1 tablet (180 mg total) by mouth daily., Disp: 30 tablet, Rfl: 0 .  fluticasone (VERAMYST) 27.5 MCG/SPRAY nasal spray, Place 2 sprays into the nose daily., Disp: 10 g, Rfl: 12 .  gabapentin (NEURONTIN) 100 MG capsule, Take 1 capsule (100 mg total) by mouth at bedtime., Disp: 90 capsule, Rfl: 3 .  HYDROcodone-homatropine (HYCODAN) 5-1.5 MG/5ML syrup, Take 5 mLs by mouth every 6 (six) hours as needed for cough., Disp: 120 mL, Rfl: 0 .  INVOKANA 300 MG TABS tablet, TAKE 1 TABLET BY MOUTH DAILY., Disp: 90 tablet, Rfl: 3 .  ipratropium-albuterol (DUONEB) 0.5-2.5 (3) MG/3ML SOLN, Take 3 mLs by nebulization every 4 (four) hours as needed., Disp: 120 mL, Rfl: 1 .  levothyroxine (SYNTHROID, LEVOTHROID) 75 MCG tablet, TAKE 1 TABLET BY MOUTH DAILY., Disp: 90 tablet, Rfl: 1 .  pantoprazole (PROTONIX) 40 MG tablet, TAKE 1 TABLET BY MOUTH DAILY., Disp: 30 tablet, Rfl: 5 .  sertraline (ZOLOFT) 100 MG tablet, Take 1 tablet (100 mg total) by mouth daily., Disp: 30 tablet, Rfl: 3 .  silver sulfADIAZINE (SILVADENE) 1 % cream, Apply 1 application topically daily., Disp: 50 g, Rfl: 0  Current Facility-Administered Medications:  Marland Kitchen  Mepolizumab SOLR 100 mg, 100 mg, Subcutaneous, Q28 days, Kasa, Kurian, MD, 100 mg at 08/03/17 2979  EXAM:  VITALS per patient if applicable:  GENERAL: alert, oriented, appears well and in no acute distress  HEENT: atraumatic, conjunttiva clear, no obvious abnormalities on inspection of external nose and ears  NECK: normal movements of the head and neck  LUNGS: on inspection no signs of respiratory distress, breathing rate appears normal, no obvious gross SOB, gasping or wheezing  CV: no obvious cyanosis  MS: moves all visible extremities without noticeable abnormality  PSYCH/NEURO: pleasant and cooperative, no obvious depression or anxiety, speech and thought processing grossly  intact  ASSESSMENT AND PLAN:  Discussed the following assessment and plan:  Bronchitis     I discussed the assessment and treatment plan with the patient. The patient was provided an opportunity to ask questions and all were answered. The patient agreed with the plan and demonstrated an understanding of the instructions.   The patient was advised to call back or seek an in-person evaluation if the symptoms worsen or if the condition fails to improve as anticipated.    Mable Paris, FNP

## 2019-01-10 NOTE — Telephone Encounter (Signed)
Patient has seen this message in [place].

## 2019-01-13 ENCOUNTER — Encounter: Payer: Self-pay | Admitting: Family

## 2019-01-13 ENCOUNTER — Ambulatory Visit (INDEPENDENT_AMBULATORY_CARE_PROVIDER_SITE_OTHER): Payer: 59 | Admitting: Family

## 2019-01-13 DIAGNOSIS — J4540 Moderate persistent asthma, uncomplicated: Secondary | ICD-10-CM

## 2019-01-13 DIAGNOSIS — F329 Major depressive disorder, single episode, unspecified: Secondary | ICD-10-CM | POA: Diagnosis not present

## 2019-01-13 DIAGNOSIS — F419 Anxiety disorder, unspecified: Secondary | ICD-10-CM

## 2019-01-13 DIAGNOSIS — F32A Depression, unspecified: Secondary | ICD-10-CM

## 2019-01-13 MED ORDER — BUSPIRONE HCL 7.5 MG PO TABS: 7.5000 mg | ORAL_TABLET | Freq: Two times a day (BID) | ORAL | 2 refills | Status: AC

## 2019-01-13 NOTE — Patient Instructions (Signed)
I am so glad you are feeling better and you may return to work.  Work note has been provided. As discussed, in regards to wearing mask is required for your workplace, I think if we can distract your mind perhaps you may be less anxious about it.  Will try BuSpar.  Again this medication is probably most effective when taken twice a day every day.  However about some patients that use it as needed.  Try dose of BuSpar today to ensure that you feel good on medication, and if that is the case you may start the medication tomorrow at work as well.  Please let me know if anything at all.

## 2019-01-13 NOTE — Assessment & Plan Note (Signed)
Asthma has returned to baseline.  When asked with patient today, I did not appreciate any shortness of breath, patient was not labored in speech, she is speaking clear sentences.  She has been more than 3 days symptom-free, and 7 since onset of symptoms.  Advised her that she may return to work.  Work note has been provided.

## 2019-01-13 NOTE — Progress Notes (Signed)
This visit type was conducted due to national recommendations for restrictions regarding the COVID-19 pandemic (e.g. social distancing).  This format is felt to be most appropriate for this patient at this time.  All issues noted in this document were discussed and addressed.  No physical exam was performed (except for noted visual exam findings with Video Visits).  Virtual Visit via Video Note  I connected with@ on 01/13/19 at  1:30 PM EDT by a video enabled telemedicine application and verified that I am speaking with the correct person using two identifiers.  Location patient: home Location provider:work Persons participating in the virtual visit: patient, provider  I discussed the limitations of evaluation and management by telemedicine and the availability of in person appointments. The patient expressed understanding and agreed to proceed.   HPI: Bridget Peters 'all better' after zpak and would like to go back to work.  Symptoms resolved approximately 4 days ago.  No cough, fever, wheezing, shortness of breath, chest pain . Has been outside when walks.  Feels well  would like Work note.  Has concern about new policy which she must wear surgical mask at work all day.  She always feels it is interferes with her breathing and asthma.  She feels slightly anxious when having to wear mask. States she is doing well on the Zoloft.  FMLA - return 01/13/19.    ROS: See pertinent positives and negatives per HPI.  Past Medical History:  Diagnosis Date  . Allergy    Seasonal  . Asthma   . Depression   . Diabetes mellitus without complication ([place])    Pt takes Metformin.  Marland Kitchen Hypertension   . Pneumonia   . Shortness of breath   . Sleep apnea   . Thyroid disease     Past Surgical History:  Procedure Laterality Date  . BREAST SURGERY  01/2013   Breast Reduction   . NASAL SINUS SURGERY    . NASAL SINUS SURGERY  02/2014  . REDUCTION MAMMAPLASTY Bilateral 2014    Family History  Problem Relation  Age of Onset  . Hypertension Mother   . Endometrial cancer Mother 40  . Hypertension Father   . Colon polyps Father   . Cancer Maternal Aunt        Breast Cancer  . Breast cancer Maternal Aunt   . Breast cancer Paternal Aunt        Breast Cancer  . Cancer Maternal Grandmother 75       Bladder Cancer  . Stroke Maternal Grandmother   . Breast cancer Maternal Grandmother 60  . Cancer Maternal Grandfather 10       Esophagus Cancer  . Colon cancer Neg Hx   . Ovarian cancer Neg Hx     SOCIAL HX: Non-smoker   Current Outpatient Medications:  .  albuterol (PROVENTIL HFA;VENTOLIN HFA) 108 (90 Base) MCG/ACT inhaler, Inhale 2 puffs into the lungs every 4 (four) hours as needed for wheezing or shortness of breath., Disp: 1 Inhaler, Rfl: 6 .  albuterol (PROVENTIL) (2.5 MG/3ML) 0.083% nebulizer solution, Take 3 mLs (2.5 mg total) by nebulization every 6 (six) hours as needed for wheezing or shortness of breath., Disp: 75 mL, Rfl: 12 .  azithromycin (ZITHROMAX) 250 MG tablet, Take 2 tablets ( total 500 mg) PO on day 1, then take 1 tablet ( total 250 mg) by mouth q24h x 4 days., Disp: 6 tablet, Rfl: 0 .  busPIRone (BUSPAR) 7.5 MG tablet, Take 1 tablet (7.5 mg total) by  mouth 2 (two) times daily for 30 days., Disp: 60 tablet, Rfl: 2 .  cetirizine (ZYRTEC ALLERGY) 10 MG tablet, Take 1 tablet (10 mg total) by mouth daily., Disp: 30 tablet, Rfl: 6 .  chlorpheniramine-HYDROcodone (TUSSIONEX PENNKINETIC ER) 10-8 MG/5ML SUER, Take 5 mLs by mouth every 12 (twelve) hours as needed., Disp: 140 mL, Rfl: 0 .  EPIPEN 2-PAK 0.3 MG/0.3ML SOAJ injection, , Disp: , Rfl: 11 .  fexofenadine (ALLEGRA) 180 MG tablet, Take 1 tablet (180 mg total) by mouth daily., Disp: 30 tablet, Rfl: 0 .  fluticasone (VERAMYST) 27.5 MCG/SPRAY nasal spray, Place 2 sprays into the nose daily., Disp: 10 g, Rfl: 12 .  Fluticasone Propionate 93 MCG/ACT EXHU, Place 1 Act into the nose daily., Disp: , Rfl:  .  gabapentin (NEURONTIN) 100 MG  capsule, Take 1 capsule (100 mg total) by mouth at bedtime., Disp: 90 capsule, Rfl: 3 .  HYDROcodone-homatropine (HYCODAN) 5-1.5 MG/5ML syrup, Take 5 mLs by mouth every 6 (six) hours as needed for cough., Disp: 120 mL, Rfl: 0 .  INVOKANA 300 MG TABS tablet, TAKE 1 TABLET BY MOUTH DAILY., Disp: 90 tablet, Rfl: 3 .  ipratropium-albuterol (DUONEB) 0.5-2.5 (3) MG/3ML SOLN, Take 3 mLs by nebulization every 4 (four) hours as needed., Disp: 120 mL, Rfl: 1 .  levothyroxine (SYNTHROID, LEVOTHROID) 75 MCG tablet, TAKE 1 TABLET BY MOUTH DAILY., Disp: 90 tablet, Rfl: 1 .  pantoprazole (PROTONIX) 40 MG tablet, TAKE 1 TABLET BY MOUTH DAILY., Disp: 30 tablet, Rfl: 5 .  sertraline (ZOLOFT) 100 MG tablet, Take 1 tablet (100 mg total) by mouth daily., Disp: 30 tablet, Rfl: 3 .  silver sulfADIAZINE (SILVADENE) 1 % cream, Apply 1 application topically daily., Disp: 50 g, Rfl: 0 .  Tiotropium Bromide Monohydrate 1.25 MCG/ACT AERS, Inhale 1.25 mcg into the lungs daily., Disp: , Rfl:   Current Facility-Administered Medications:  Marland Kitchen  Mepolizumab SOLR 100 mg, 100 mg, Subcutaneous, Q28 days, Kasa, Kurian, MD, 100 mg at 08/03/17 5631  EXAM:  VITALS per patient if applicable:  GENERAL: alert, oriented, appears well and in no acute distress  HEENT: atraumatic, conjunttiva clear, no obvious abnormalities on inspection of external nose and ears  NECK: normal movements of the head and neck  LUNGS: on inspection no signs of respiratory distress, breathing rate appears normal, no obvious gross SOB, gasping or wheezing  CV: no obvious cyanosis  MS: moves all visible extremities without noticeable abnormality  PSYCH/NEURO: pleasant and cooperative, no obvious depression or anxiety, speech and thought processing grossly intact  ASSESSMENT AND PLAN:  Discussed the following assessment and plan:  Anxiety and depression - Plan: busPIRone (BUSPAR) 7.5 MG tablet  Moderate persistent asthma without complication Problem  List Items Addressed This Visit      Respiratory   Asthma, moderate persistent    Asthma has returned to baseline.  When asked with patient today, I did not appreciate any shortness of breath, patient was not labored in speech, she is speaking clear sentences.  She has been more than 3 days symptom-free, and 7 since onset of symptoms.  Advised her that she may return to work.  Work note has been provided.        Other   Anxiety and depression - Primary    Advised patient that I think wearing the surgical mask perhaps triggers anxiety for her.  Since important for her to protect herself especially in the setting of a pandemic, and her underlying asthma as well as to follow policy  at work, we jointly agreed we would try for her to wear masks tomorrow at work.   She will maintain on Zoloft, we will adjunct with low-dose BuSpar.  She will try her first dose today prior to returning to work.  She will let me know how she is doing      Relevant Medications   busPIRone (BUSPAR) 7.5 MG tablet        I discussed the assessment and treatment plan with the patient. The patient was provided an opportunity to ask questions and all were answered. The patient agreed with the plan and demonstrated an understanding of the instructions.   The patient was advised to call back or seek an in-person evaluation if the symptoms worsen or if the condition fails to improve as anticipated.    Mable Paris, FNP

## 2019-01-13 NOTE — Assessment & Plan Note (Signed)
Advised patient that I think wearing the surgical mask perhaps triggers anxiety for her.  Since important for her to protect herself especially in the setting of a pandemic, and her underlying asthma as well as to follow policy at work, we jointly agreed we would try for her to wear masks tomorrow at work.   She will maintain on Zoloft, we will adjunct with low-dose BuSpar.  She will try her first dose today prior to returning to work.  She will let me know how she is doing

## 2019-01-14 MED FILL — PANTOPRAZOLE SOD DR 40 MG T: 40 | 30 days supply | Qty: 30 | Fill #0

## 2019-01-14 MED FILL — LEVOTHYROXINE 75 MCG TABLET: 75 | 90 days supply | Qty: 90 | Fill #0

## 2019-01-16 DIAGNOSIS — J4550 Severe persistent asthma, uncomplicated: Secondary | ICD-10-CM | POA: Diagnosis not present

## 2019-01-21 MED FILL — GABAPENTIN 100 MG CAPSULE: 100 | 90 days supply | Qty: 90 | Fill #0

## 2019-02-07 ENCOUNTER — Other Ambulatory Visit: Payer: Self-pay | Admitting: Internal Medicine

## 2019-02-07 MED FILL — PANTOPRAZOLE SOD DR 40 MG T: 40 | 30 days supply | Qty: 30 | Fill #0

## 2019-02-08 ENCOUNTER — Other Ambulatory Visit: Payer: Self-pay | Admitting: Internal Medicine

## 2019-02-17 MED FILL — MONTELUKAST SOD 10 MG TAB: 10 | 30 days supply | Qty: 90 | Fill #0

## 2019-02-20 ENCOUNTER — Telehealth: Payer: Self-pay | Admitting: Internal Medicine

## 2019-02-20 NOTE — Telephone Encounter (Signed)
Received record request from [facility]. Request has been faxed to [facility].

## 2019-02-27 DIAGNOSIS — J4550 Severe persistent asthma, uncomplicated: Secondary | ICD-10-CM | POA: Diagnosis not present

## 2019-03-04 ENCOUNTER — Other Ambulatory Visit: Payer: Self-pay | Admitting: Internal Medicine

## 2019-03-04 MED FILL — PANTOPRAZOLE SOD DR 40 MG T: 40 | 30 days supply | Qty: 30 | Fill #0

## 2019-03-08 ENCOUNTER — Other Ambulatory Visit: Payer: Self-pay | Admitting: Internal Medicine

## 2019-03-11 ENCOUNTER — Other Ambulatory Visit: Payer: Self-pay | Admitting: Internal Medicine

## 2019-03-11 NOTE — Telephone Encounter (Signed)
Received Rx request from [facility] for Protonix 40mg .  Rx has been denied, as pt is past due for appointment.

## 2019-03-14 MED FILL — MONTELUKAST SOD 10 MG TAB: 10 | 30 days supply | Qty: 90 | Fill #1

## 2019-03-19 ENCOUNTER — Other Ambulatory Visit: Payer: Self-pay

## 2019-03-21 ENCOUNTER — Encounter: Payer: Self-pay | Admitting: Family

## 2019-03-21 ENCOUNTER — Telehealth: Payer: Self-pay | Admitting: Family

## 2019-03-21 ENCOUNTER — Other Ambulatory Visit: Payer: Self-pay

## 2019-03-21 ENCOUNTER — Ambulatory Visit (INDEPENDENT_AMBULATORY_CARE_PROVIDER_SITE_OTHER): Payer: 59 | Admitting: Family

## 2019-03-21 VITALS — BP 142/80 | HR 80 | Temp 98.6°F | Ht 62.25 in | Wt 167.8 lb

## 2019-03-21 DIAGNOSIS — J4540 Moderate persistent asthma, uncomplicated: Secondary | ICD-10-CM

## 2019-03-21 DIAGNOSIS — I10 Essential (primary) hypertension: Secondary | ICD-10-CM

## 2019-03-21 DIAGNOSIS — Z0000 Encounter for general adult medical examination without abnormal findings: Secondary | ICD-10-CM

## 2019-03-21 DIAGNOSIS — E119 Type 2 diabetes mellitus without complications: Secondary | ICD-10-CM

## 2019-03-21 LAB — COMPREHENSIVE METABOLIC PANEL
ALT: 49 U/L — ABNORMAL HIGH (ref 0–35)
AST: 27 U/L (ref 0–37)
Albumin: 4.7 g/dL (ref 3.5–5.2)
Alkaline Phosphatase: 94 U/L (ref 39–117)
BUN: 18 mg/dL (ref 6–23)
CO2: 27 mEq/L (ref 19–32)
Calcium: 9.6 mg/dL (ref 8.4–10.5)
Chloride: 101 mEq/L (ref 96–112)
Creatinine, Ser: 0.93 mg/dL (ref 0.40–1.20)
GFR: 64.3 mL/min (ref >60.00)
Glucose, Bld: 150 mg/dL — ABNORMAL HIGH (ref 70–99)
Potassium: 4.4 mEq/L (ref 3.5–5.1)
Sodium: 138 mEq/L (ref 135–145)
Total Bilirubin: 0.4 mg/dL (ref 0.2–1.2)
Total Protein: 6.9 g/dL (ref 6.0–8.3)

## 2019-03-21 LAB — CBC WITH DIFFERENTIAL/PLATELET
Basophils Absolute: 0 10*3/uL (ref 0.0–0.1)
Basophils Relative: 0.3 % (ref 0.0–3.0)
Eosinophils Absolute: 0 10*3/uL (ref 0.0–0.7)
Eosinophils Relative: 0.4 % (ref 0.0–5.0)
HCT: 41.6 % (ref 36.0–46.0)
Hemoglobin: 14.1 g/dL (ref 12.0–15.0)
Lymphocytes Relative: 36.2 % (ref 12.0–46.0)
Lymphs Abs: 1.9 10*3/uL (ref 0.7–4.0)
MCHC: 33.8 g/dL (ref 30.0–36.0)
MCV: 86.1 fl (ref 78.0–100.0)
Monocytes Absolute: 0.3 10*3/uL (ref 0.1–1.0)
Monocytes Relative: 5.9 % (ref 3.0–12.0)
Neutro Abs: 2.9 10*3/uL (ref 1.4–7.7)
Neutrophils Relative %: 57.2 % (ref 43.0–77.0)
Platelets: 273 10*3/uL (ref 150.0–400.0)
RBC: 4.84 Mil/uL (ref 3.87–5.11)
RDW: 13.6 % (ref 11.5–15.5)
WBC: 5.1 10*3/uL (ref 4.0–10.5)

## 2019-03-21 LAB — LIPID PANEL
Cholesterol: 330 mg/dL — ABNORMAL HIGH (ref 0–200)
HDL: 67.8 mg/dL (ref >39.00)
NonHDL: 261.99
Total CHOL/HDL Ratio: 5
Triglycerides: 396 mg/dL — ABNORMAL HIGH (ref 0.0–149.0)
VLDL: 79.2 mg/dL — ABNORMAL HIGH (ref 0.0–40.0)

## 2019-03-21 LAB — TSH: TSH: 1.4 u[IU]/mL (ref 0.35–4.50)

## 2019-03-21 LAB — MICROALBUMIN / CREATININE URINE RATIO
Creatinine,U: 47.5 mg/dL
Microalb Creat Ratio: 1.5 mg/g (ref 0.0–30.0)
Microalb, Ur: <0.7 mg/dL (ref 0.0–1.9)

## 2019-03-21 LAB — VITAMIN D 25 HYDROXY (VIT D DEFICIENCY, FRACTURES): VITD: 36.63 ng/mL (ref 30.00–100.00)

## 2019-03-21 LAB — LDL CHOLESTEROL, DIRECT: Direct LDL: 184 mg/dL

## 2019-03-21 LAB — HEMOGLOBIN A1C: Hgb A1c MFr Bld: 7.2 % — ABNORMAL HIGH (ref 4.6–6.5)

## 2019-03-21 MED ORDER — AMLODIPINE BESYLATE 2.5 MG PO TABS: 2.5000 mg | ORAL_TABLET | Freq: Every day | ORAL | 3 refills | Status: DC

## 2019-03-21 NOTE — Assessment & Plan Note (Signed)
Pending a1c. 

## 2019-03-21 NOTE — Assessment & Plan Note (Signed)
Improved. Will follow

## 2019-03-21 NOTE — Assessment & Plan Note (Signed)
Clinical breast exam performed.  Deferred pelvic exam in the absence of complaints, Pap smear is up-to-date.  Colonoscopy is up-to-date.  Patient will schedule mammogram.

## 2019-03-21 NOTE — Telephone Encounter (Signed)
Call pt  Her SBP was elevated today Sorry since  Computer was cutting out , I missed that  Is she no longer on amlodipine?  I would advise that she restart amlodipine 2.5mg  if she is willing.  Does she have bp cuff at home?  She then can call with BP , HR log

## 2019-03-21 NOTE — Progress Notes (Signed)
Subjective:    Patient ID: Bridget Peters, female    DOB: [birth year], 48 y.o.   MRN: [id number]  CC: Bridget Peters is a 48 y.o. female who presents today for physical exam.    HPI: HPI Feels well today. No concerns.   htn- had stopped taking amlodipine.  Denies exertional chest pain or pressure, numbness or tingling radiating to left arm or jaw, palpitations, dizziness, frequent headaches, changes in vision, or shortness of breath.   DM-compliant with medication  GAD- improved. She liked the zoloft and found effective. However has become more spiritual of late which has helped her manage anxiety. Decided to come off zoloft as didn't feel she needed. Not on buspar and never tried. Sleeping well. No si/hi.  Asthma- Overall improved on nucula ; following with Dr Mortimer Fries  Colorectal Cancer Screening: father has colon polyps; UTD colonoscopy. Breast Cancer Screening: Mammogram due Cervical Cancer Screening: UTD; no pelvic pain Bone Health screening/DEXA for 65+: No increased fracture risk. Defer screening at this time. Lung Cancer Screening: Doesn't have 30 year pack year history and age > 49 years.       Tetanus - utd       Labs: Screening labs today. Exercise: Gets regular exercise.  Alcohol use: occasional Smoking/tobacco use: Nonsmoker.  Regular dental exams:UTD Wears seat belt: Yes. Skin : No new lesions, has seen dermatology in the past  HISTORY:  Past Medical History:  Diagnosis Date  . Allergy    Seasonal  . Asthma   . Depression   . Diabetes mellitus without complication ([place])    Pt takes Metformin.  Marland Kitchen Hypertension   . Pneumonia   . Shortness of breath   . Sleep apnea   . Thyroid disease     Past Surgical History:  Procedure Laterality Date  . BREAST SURGERY  01/2013   Breast Reduction   . NASAL SINUS SURGERY    . NASAL SINUS SURGERY  02/2014  . REDUCTION MAMMAPLASTY Bilateral 2014   Family History  Problem Relation Age of Onset  . Hypertension  Mother   . Endometrial cancer Mother 28  . Hypertension Father   . Colon polyps Father   . Cancer Maternal Aunt        Breast Cancer  . Breast cancer Maternal Aunt   . Breast cancer Paternal Aunt        Breast Cancer  . Cancer Maternal Grandmother 57       Bladder Cancer  . Stroke Maternal Grandmother   . Breast cancer Maternal Grandmother 60  . Cancer Maternal Grandfather 56       Esophagus Cancer  . Colon cancer Neg Hx   . Ovarian cancer Neg Hx       ALLERGIES: Milk-related compounds, Shellfish allergy, Tamiflu [oseltamivir phosphate], Wheat bran, Xolair [omalizumab], and Isovue [iopamidol]  Current Outpatient Medications on File Prior to Visit  Medication Sig Dispense Refill  . albuterol (PROVENTIL HFA;VENTOLIN HFA) 108 (90 Base) MCG/ACT inhaler Inhale 2 puffs into the lungs every 4 (four) hours as needed for wheezing or shortness of breath. 1 Inhaler 6  . albuterol (PROVENTIL) (2.5 MG/3ML) 0.083% nebulizer solution Take 3 mLs (2.5 mg total) by nebulization every 6 (six) hours as needed for wheezing or shortness of breath. 75 mL 12  . cetirizine (ZYRTEC ALLERGY) 10 MG tablet Take 1 tablet (10 mg total) by mouth daily. 30 tablet 6  . EPIPEN 2-PAK 0.3 MG/0.3ML SOAJ injection   11  . fexofenadine (ALLEGRA)  180 MG tablet Take 1 tablet (180 mg total) by mouth daily. 30 tablet 0  . fluticasone (VERAMYST) 27.5 MCG/SPRAY nasal spray Place 2 sprays into the nose daily. 10 g 12  . Fluticasone Propionate 93 MCG/ACT EXHU Place 1 Act into the nose daily.    Marland Kitchen gabapentin (NEURONTIN) 100 MG capsule Take 1 capsule (100 mg total) by mouth at bedtime. 90 capsule 3  . INVOKANA 300 MG TABS tablet TAKE 1 TABLET BY MOUTH DAILY. 90 tablet 3  . ipratropium-albuterol (DUONEB) 0.5-2.5 (3) MG/3ML SOLN Take 3 mLs by nebulization every 4 (four) hours as needed. 120 mL 1  . levothyroxine (SYNTHROID, LEVOTHROID) 75 MCG tablet TAKE 1 TABLET BY MOUTH DAILY. 90 tablet 1  . pantoprazole (PROTONIX) 40 MG tablet  TAKE 1 TABLET BY MOUTH DAILY. 30 tablet 0  . silver sulfADIAZINE (SILVADENE) 1 % cream Apply 1 application topically daily. 50 g 0  . Tiotropium Bromide Monohydrate 1.25 MCG/ACT AERS Inhale 1.25 mcg into the lungs daily.     Current Facility-Administered Medications on File Prior to Visit  Medication Dose Route Frequency Provider Last Rate Last Dose  . Mepolizumab SOLR 100 mg  100 mg Subcutaneous Q28 days Flora Lipps, MD   100 mg at 08/03/17 1443    Social History   Tobacco Use  . Smoking status: Never Smoker  . Smokeless tobacco: Never Used  Substance Use Topics  . Alcohol use: Yes    Alcohol/week: 0.0 standard drinks  . Drug use: No    Review of Systems  Constitutional: Negative for chills, fever and unexpected weight change.  HENT: Negative for congestion.   Respiratory: Negative for cough, shortness of breath and wheezing.   Cardiovascular: Negative for chest pain, palpitations and leg swelling.  Gastrointestinal: Negative for nausea and vomiting.  Genitourinary: Negative for pelvic pain.  Musculoskeletal: Negative for arthralgias and myalgias.  Skin: Negative for rash.  Neurological: Negative for headaches.  Hematological: Negative for adenopathy.  Psychiatric/Behavioral: Negative for confusion and sleep disturbance. The patient is not nervous/anxious (improved).       Objective:    BP (!) 142/80   Pulse 80   Temp 98.6 F (37 C)   Ht 5' 2.25" (1.581 m)   Wt 167 lb 12.8 oz (76.1 kg)   SpO2 96%   BMI 30.45 kg/m   BP Readings from Last 3 Encounters:  03/21/19 (!) 142/80  01/03/19 (!) 147/93  11/12/18 (!) 150/98   Wt Readings from Last 3 Encounters:  03/21/19 167 lb 12.8 oz (76.1 kg)  01/03/19 150 lb (68 kg)  11/12/18 165 lb 9.6 oz (75.1 kg)    Physical Exam Vitals signs reviewed.  Constitutional:      Appearance: She is well-developed.  Eyes:     Conjunctiva/sclera: Conjunctivae normal.  Neck:     Thyroid: No thyroid mass or thyromegaly.   Cardiovascular:     Rate and Rhythm: Normal rate and regular rhythm.     Pulses: Normal pulses.     Heart sounds: Normal heart sounds.  Pulmonary:     Effort: Pulmonary effort is normal.     Breath sounds: Normal breath sounds. No wheezing, rhonchi or rales.  Chest:     Breasts: Breasts are symmetrical.        Right: No inverted nipple, mass, nipple discharge, skin change or tenderness.        Left: No inverted nipple, mass, nipple discharge, skin change or tenderness.  Lymphadenopathy:     Head:  Right side of head: No submental, submandibular, tonsillar, preauricular, posterior auricular or occipital adenopathy.     Left side of head: No submental, submandibular, tonsillar, preauricular, posterior auricular or occipital adenopathy.     Cervical: No cervical adenopathy.     Right cervical: No superficial, deep or posterior cervical adenopathy.    Left cervical: No superficial, deep or posterior cervical adenopathy.  Skin:    General: Skin is warm and dry.  Neurological:     Mental Status: She is alert.  Psychiatric:        Speech: Speech normal.        Behavior: Behavior normal.        Thought Content: Thought content normal.        Assessment & Plan:   Problem List Items Addressed This Visit      Cardiovascular and Mediastinum   HTN (hypertension)    BP Readings from Last 3 Encounters:  03/21/19 (!) 142/80  01/03/19 (!) 147/93  11/12/18 (!) 150/98   Elevated today. Resume amlodipine. She will send me HR/BP readings from home.         Respiratory   Asthma, moderate persistent    Improved. Will follow        Endocrine   Diabetes mellitus type 2, controlled, without complications ([place])    Pending a1c.        Other   Routine physical examination - Primary    Clinical breast exam performed.  Deferred pelvic exam in the absence of complaints, Pap smear is up-to-date.  Colonoscopy is up-to-date.  Patient will schedule mammogram.       Relevant Orders    TSH   CBC with Differential/Platelet   Comprehensive metabolic panel   Hemoglobin A1c   Lipid panel   MM 3D SCREEN BREAST BILATERAL   Ambulatory referral to Dermatology   VITAMIN D 25 Hydroxy (Vit-D Deficiency, Fractures)   Microalbumin / creatinine urine ratio       I have discontinued Dalisha E. Deike's chlorpheniramine-HYDROcodone, sertraline, HYDROcodone-homatropine, and azithromycin. I am also having her maintain her fluticasone, EpiPen 2-Pak, fexofenadine, cetirizine, silver sulfADIAZINE, gabapentin, levothyroxine, Invokana, albuterol, albuterol, ipratropium-albuterol, Tiotropium Bromide Monohydrate, Fluticasone Propionate, and pantoprazole. We will continue to administer Mepolizumab.   No orders of the defined types were placed in this encounter.   Return precautions given.   Risks, benefits, and alternatives of the medications and treatment plan prescribed today were discussed, and patient expressed understanding.   Education regarding symptom management and diagnosis given to patient on AVS.   Continue to follow with Burnard Hawthorne, FNP for routine health maintenance.   Schuyler E Eustache and I agreed with plan.   Mable Paris, FNP

## 2019-03-21 NOTE — Patient Instructions (Signed)
Schedule mammogram  So nice to see you, always!   Health Maintenance for Postmenopausal Women Menopause is a normal process in which your reproductive ability comes to an end. This process happens gradually over a span of months to years, usually between the ages of 52 and 29. Menopause is complete when you have missed 12 consecutive menstrual periods. It is important to talk with your health care provider about some of the most common conditions that affect postmenopausal women, such as heart disease, cancer, and bone loss (osteoporosis). Adopting a healthy lifestyle and getting preventive care can help to promote your health and wellness. Those actions can also lower your chances of developing some of these common conditions. What should I know about menopause? During menopause, you may experience a number of symptoms, such as:  Moderate-to-severe hot flashes.  Night sweats.  Decrease in sex drive.  Mood swings.  Headaches.  Tiredness.  Irritability.  Memory problems.  Insomnia. Choosing to treat or not to treat menopausal changes is an individual decision that you make with your health care provider. What should I know about hormone replacement therapy and supplements? Hormone therapy products are effective for treating symptoms that are associated with menopause, such as hot flashes and night sweats. Hormone replacement carries certain risks, especially as you become older. If you are thinking about using estrogen or estrogen with progestin treatments, discuss the benefits and risks with your health care provider. What should I know about heart disease and stroke? Heart disease, heart attack, and stroke become more likely as you age. This may be due, in part, to the hormonal changes that your body experiences during menopause. These can affect how your body processes dietary fats, triglycerides, and cholesterol. Heart attack and stroke are both medical emergencies. There are many  things that you can do to help prevent heart disease and stroke:  Have your blood pressure checked at least every 1-2 years. High blood pressure causes heart disease and increases the risk of stroke.  If you are 82-37 years old, ask your health care provider if you should take aspirin to prevent a heart attack or a stroke.  Do not use any tobacco products, including cigarettes, chewing tobacco, or electronic cigarettes. If you need help quitting, ask your health care provider.  It is important to eat a healthy diet and maintain a healthy weight. ? Be sure to include plenty of vegetables, fruits, low-fat dairy products, and lean protein. ? Avoid eating foods that are high in solid fats, added sugars, or salt (sodium).  Get regular exercise. This is one of the most important things that you can do for your health. ? Try to exercise for at least 150 minutes each week. The type of exercise that you do should increase your heart rate and make you sweat. This is known as moderate-intensity exercise. ? Try to do strengthening exercises at least twice each week. Do these in addition to the moderate-intensity exercise.  Know your numbers.Ask your health care provider to check your cholesterol and your blood glucose. Continue to have your blood tested as directed by your health care provider.  What should I know about cancer screening? There are several types of cancer. Take the following steps to reduce your risk and to catch any cancer development as early as possible. Breast Cancer  Practice breast self-awareness. ? This means understanding how your breasts normally appear and feel. ? It also means doing regular breast self-exams. Let your health care provider know about any  changes, no matter how small.  If you are 23 or older, have a clinician do a breast exam (clinical breast exam or CBE) every year. Depending on your age, family history, and medical history, it may be recommended that you  also have a yearly breast X-ray (mammogram).  If you have a family history of breast cancer, talk with your health care provider about genetic screening.  If you are at high risk for breast cancer, talk with your health care provider about having an MRI and a mammogram every year.  Breast cancer (BRCA) gene test is recommended for women who have family members with BRCA-related cancers. Results of the assessment will determine the need for genetic counseling and BRCA1 and for BRCA2 testing. BRCA-related cancers include these types: ? Breast. This occurs in males or females. ? Ovarian. ? Tubal. This may also be called fallopian tube cancer. ? Cancer of the abdominal or pelvic lining (peritoneal cancer). ? Prostate. ? Pancreatic. Cervical, Uterine, and Ovarian Cancer Your health care provider may recommend that you be screened regularly for cancer of the pelvic organs. These include your ovaries, uterus, and vagina. This screening involves a pelvic exam, which includes checking for microscopic changes to the surface of your cervix (Pap test).  For women ages 21-65, health care providers may recommend a pelvic exam and a Pap test every three years. For women ages 24-65, they may recommend the Pap test and pelvic exam, combined with testing for human papilloma virus (HPV), every five years. Some types of HPV increase your risk of cervical cancer. Testing for HPV may also be done on women of any age who have unclear Pap test results.  Other health care providers may not recommend any screening for nonpregnant women who are considered low risk for pelvic cancer and have no symptoms. Ask your health care provider if a screening pelvic exam is right for you.  If you have had past treatment for cervical cancer or a condition that could lead to cancer, you need Pap tests and screening for cancer for at least 20 years after your treatment. If Pap tests have been discontinued for you, your risk factors (such  as having a new sexual partner) need to be reassessed to determine if you should start having screenings again. Some women have medical problems that increase the chance of getting cervical cancer. In these cases, your health care provider may recommend that you have screening and Pap tests more often.  If you have a family history of uterine cancer or ovarian cancer, talk with your health care provider about genetic screening.  If you have vaginal bleeding after reaching menopause, tell your health care provider.  There are currently no reliable tests available to screen for ovarian cancer. Lung Cancer Lung cancer screening is recommended for adults 82-3 years old who are at high risk for lung cancer because of a history of smoking. A yearly low-dose CT scan of the lungs is recommended if you:  Currently smoke.  Have a history of at least 30 pack-years of smoking and you currently smoke or have quit within the past 15 years. A pack-year is smoking an average of one pack of cigarettes per day for one year. Yearly screening should:  Continue until it has been 15 years since you quit.  Stop if you develop a health problem that would prevent you from having lung cancer treatment. Colorectal Cancer  This type of cancer can be detected and can often be prevented.  Routine  colorectal cancer screening usually begins at age 23 and continues through age 71.  If you have risk factors for colon cancer, your health care provider may recommend that you be screened at an earlier age.  If you have a family history of colorectal cancer, talk with your health care provider about genetic screening.  Your health care provider may also recommend using home test kits to check for hidden blood in your stool.  A small camera at the end of a tube can be used to examine your colon directly (sigmoidoscopy or colonoscopy). This is done to check for the earliest forms of colorectal cancer.  Direct examination  of the colon should be repeated every 5-10 years until age 34. However, if early forms of precancerous polyps or small growths are found or if you have a family history or genetic risk for colorectal cancer, you may need to be screened more often. Skin Cancer  Check your skin from head to toe regularly.  Monitor any moles. Be sure to tell your health care provider: ? About any new moles or changes in moles, especially if there is a change in a mole's shape or color. ? If you have a mole that is larger than the size of a pencil eraser.  If any of your family members has a history of skin cancer, especially at a young age, talk with your health care provider about genetic screening.  Always use sunscreen. Apply sunscreen liberally and repeatedly throughout the day.  Whenever you are outside, protect yourself by wearing long sleeves, pants, a wide-brimmed hat, and sunglasses. What should I know about osteoporosis? Osteoporosis is a condition in which bone destruction happens more quickly than new bone creation. After menopause, you may be at an increased risk for osteoporosis. To help prevent osteoporosis or the bone fractures that can happen because of osteoporosis, the following is recommended:  If you are 12-1 years old, get at least 1,000 mg of calcium and at least 600 mg of vitamin D per day.  If you are older than age 35 but younger than age 44, get at least 1,200 mg of calcium and at least 600 mg of vitamin D per day.  If you are older than age 31, get at least 1,200 mg of calcium and at least 800 mg of vitamin D per day. Smoking and excessive alcohol intake increase the risk of osteoporosis. Eat foods that are rich in calcium and vitamin D, and do weight-bearing exercises several times each week as directed by your health care provider. What should I know about how menopause affects my mental health? Depression may occur at any age, but it is more common as you become older. Common  symptoms of depression include:  Low or sad mood.  Changes in sleep patterns.  Changes in appetite or eating patterns.  Feeling an overall lack of motivation or enjoyment of activities that you previously enjoyed.  Frequent crying spells. Talk with your health care provider if you think that you are experiencing depression. What should I know about immunizations? It is important that you get and maintain your immunizations. These include:  Tetanus, diphtheria, and pertussis (Tdap) booster vaccine.  Influenza every year before the flu season begins.  Pneumonia vaccine.  Shingles vaccine. Your health care provider may also recommend other immunizations. This information is not intended to replace advice given to you by your health care provider. Make sure you discuss any questions you have with your health care provider. Document Released:  11/17/2005 Document Revised: 04/14/2016 Document Reviewed: 06/29/2015 Elsevier Interactive Patient Education  [employer].

## 2019-03-21 NOTE — Telephone Encounter (Signed)
noted 

## 2019-03-21 NOTE — Assessment & Plan Note (Addendum)
BP Readings from Last 3 Encounters:  03/21/19 (!) 142/80  01/03/19 (!) 147/93  11/12/18 (!) 150/98   Elevated today. Resume amlodipine. She will send me HR/BP readings from home.

## 2019-03-26 ENCOUNTER — Other Ambulatory Visit: Payer: Self-pay | Admitting: Family

## 2019-03-26 ENCOUNTER — Encounter: Payer: Self-pay | Admitting: Family

## 2019-03-26 DIAGNOSIS — I10 Essential (primary) hypertension: Secondary | ICD-10-CM

## 2019-03-26 DIAGNOSIS — E119 Type 2 diabetes mellitus without complications: Secondary | ICD-10-CM

## 2019-04-01 DIAGNOSIS — J4550 Severe persistent asthma, uncomplicated: Secondary | ICD-10-CM | POA: Diagnosis not present

## 2019-04-01 NOTE — Telephone Encounter (Signed)
LMTCB

## 2019-04-03 ENCOUNTER — Encounter: Payer: Self-pay | Admitting: Family

## 2019-04-08 ENCOUNTER — Other Ambulatory Visit: Payer: Self-pay | Admitting: Family

## 2019-04-08 DIAGNOSIS — E039 Hypothyroidism, unspecified: Secondary | ICD-10-CM

## 2019-04-09 MED FILL — LEVOTHYROXINE 75 MCG TABLET: 75 | 90 days supply | Qty: 90 | Fill #0

## 2019-04-11 MED FILL — MONTELUKAST SOD 10 MG TAB: 10 | 30 days supply | Qty: 90 | Fill #2

## 2019-04-15 MED FILL — GABAPENTIN 100 MG CAPSULE: 100 | 90 days supply | Qty: 90 | Fill #1

## 2019-04-17 ENCOUNTER — Other Ambulatory Visit: Payer: Self-pay | Admitting: Family

## 2019-04-17 DIAGNOSIS — E039 Hypothyroidism, unspecified: Secondary | ICD-10-CM

## 2019-05-04 IMAGING — CT CT ABD-PELV W/ CM
1 of 3 series · 14 of 32 positions shown, 19 images · IV contrast (APPLIED)
Comparison: No priors.

CLINICAL DATA: 46-year-old female with history of severe fatigue,
night sweats and bilateral upper abdominal pain for the past 2-3
months.

EXAM:
CT ABDOMEN AND PELVIS WITH CONTRAST
TECHNIQUE: Multidetector CT imaging of the abdomen and pelvis was performed
using the standard protocol following bolus administration of
intravenous contrast.
CONTRAST:  100mL L3DUTJ-JHH IOPAMIDOL (L3DUTJ-JHH) INJECTION 61%

[Series 2: axial st · axial · 0.69mm/px · z∈[-997,-577]mm · 14 of 96 slices shown, 19 images]
[im 6/96  soft-tissue]
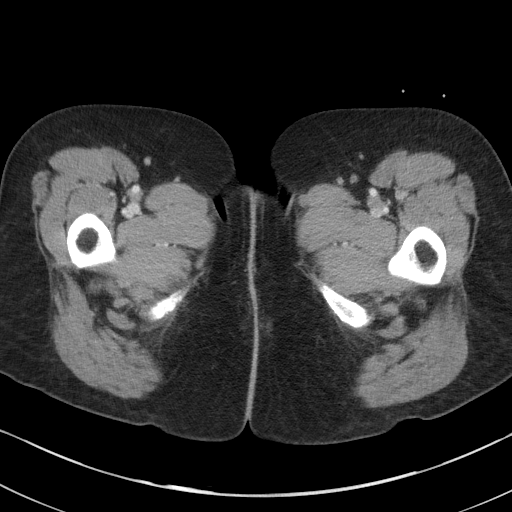
[im 6/96  bone]
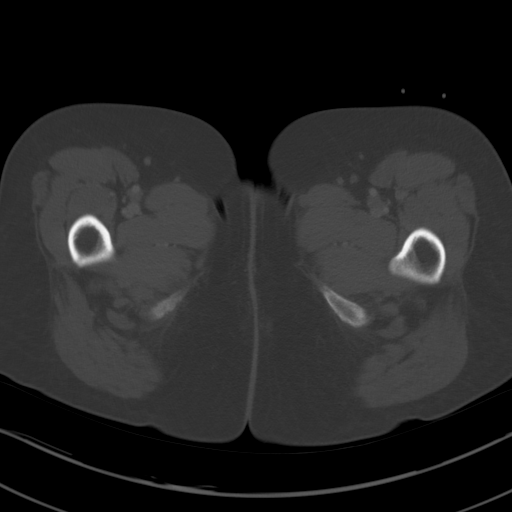
[im 11/96  soft-tissue]
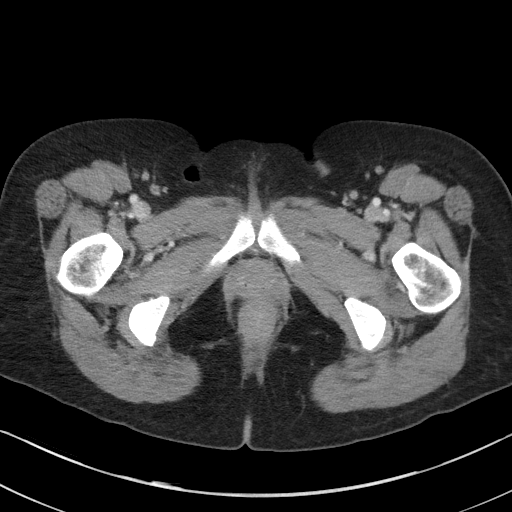
[im 22/96  soft-tissue]
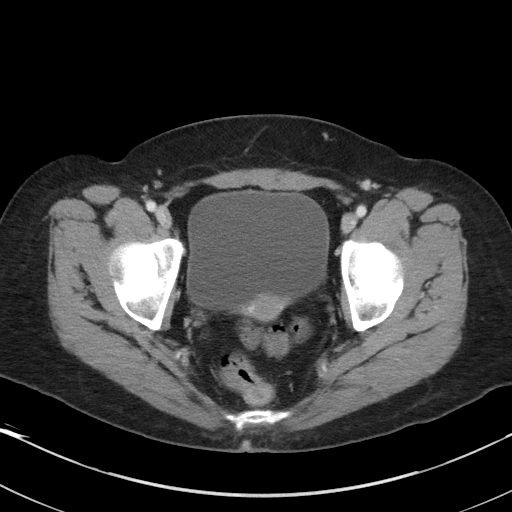
[im 27/96  soft-tissue]
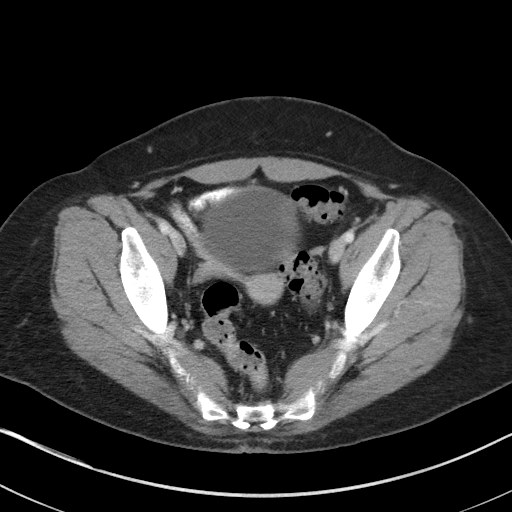
[im 32/96  soft-tissue]
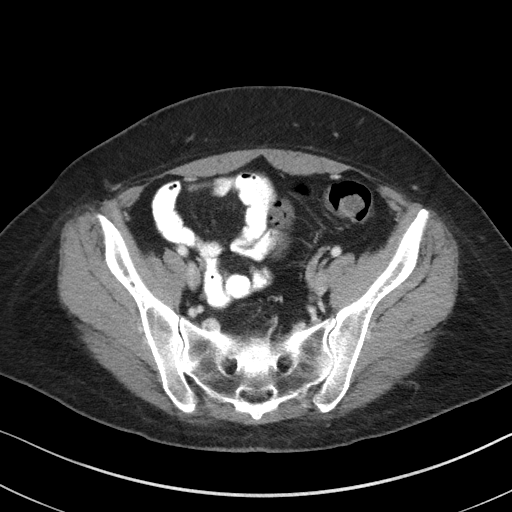
[im 43/96  soft-tissue]
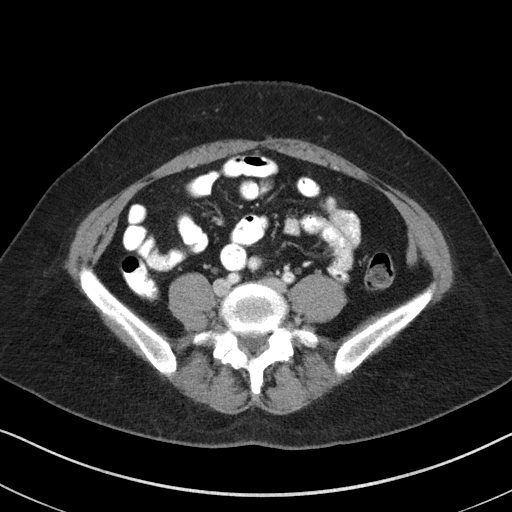
[im 48/96  soft-tissue]
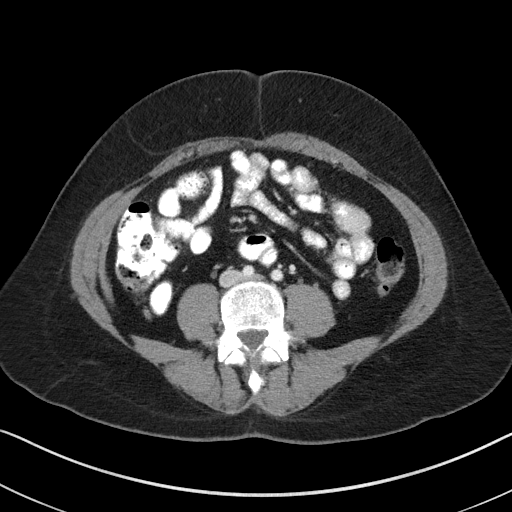
[im 53/96  soft-tissue]
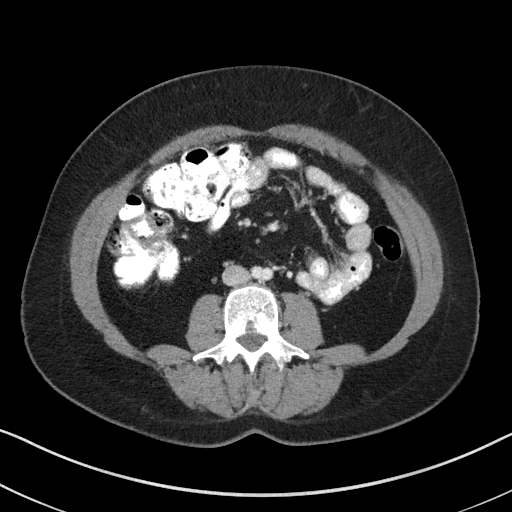
[im 64/96  soft-tissue]
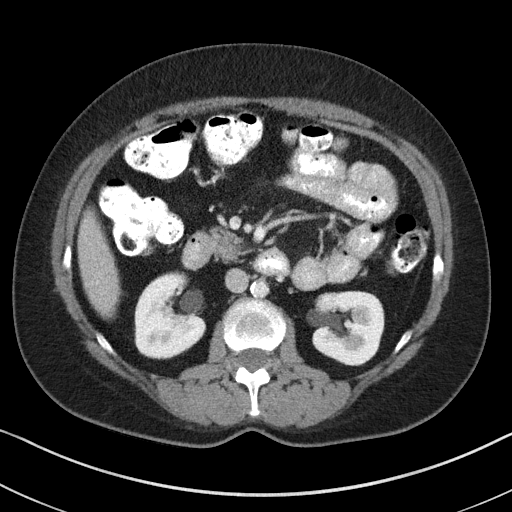
[im 64/96  bone]
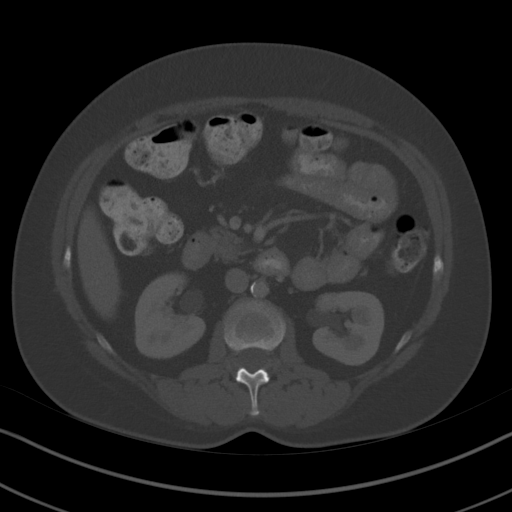
[im 69/96  soft-tissue]
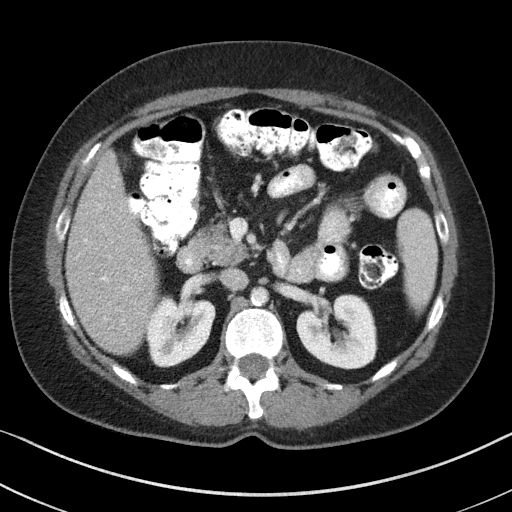
[im 74/96  soft-tissue]
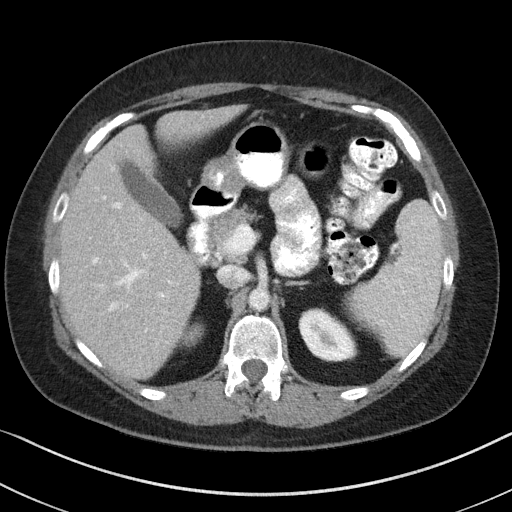
[im 74/96  lung]
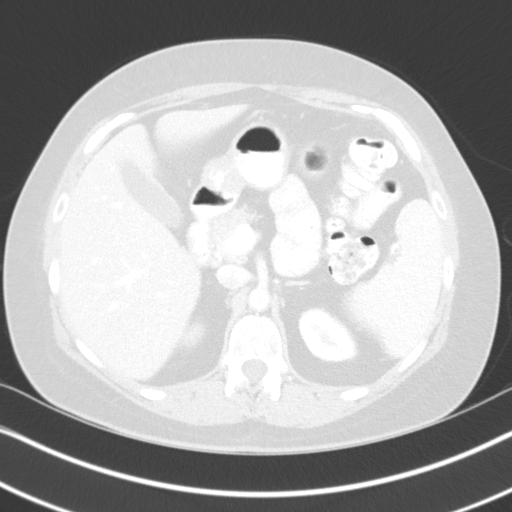
[im 80/96  lung]
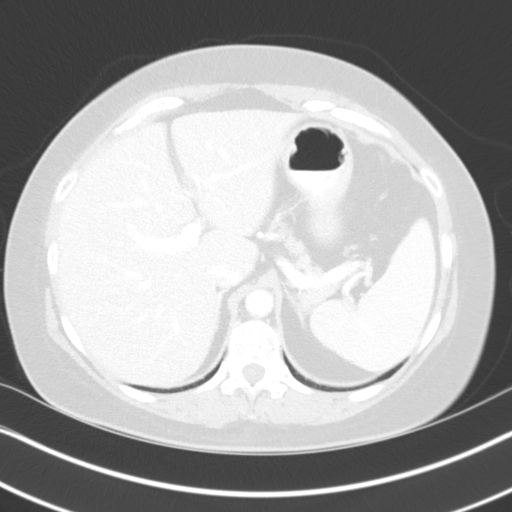
[im 85/96  soft-tissue]
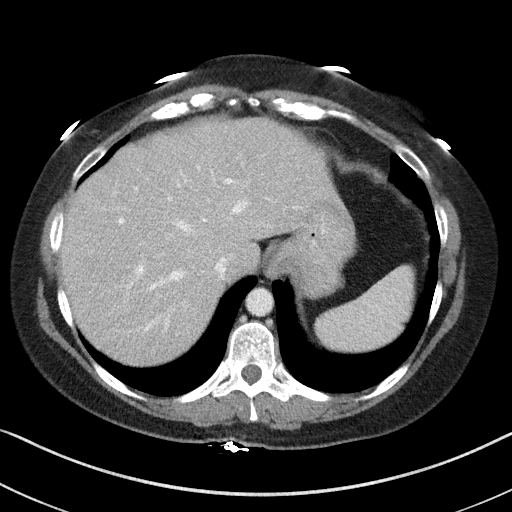
[im 85/96  lung]
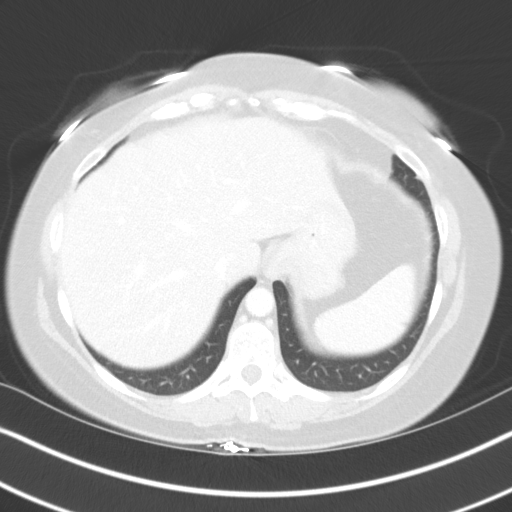
[im 90/96  soft-tissue]
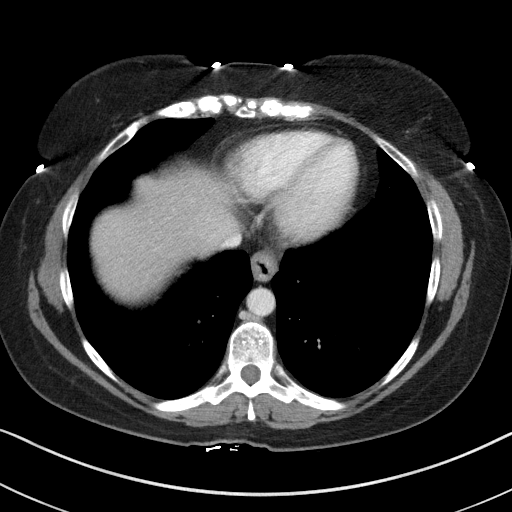
[im 90/96  lung]
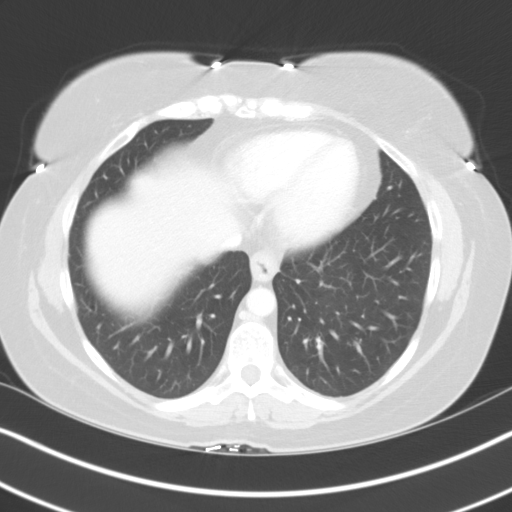

[14 of 32 positions shown; findings below may reference images not displayed]

FINDINGS: Lower chest: Unremarkable.

Hepatobiliary: No cystic or solid hepatic lesions. No intra or
extrahepatic biliary ductal dilatation. Gallbladder is normal in
appearance.

Pancreas: No pancreatic mass. No pancreatic ductal dilatation. No
pancreatic or peripancreatic fluid or inflammatory changes.

Spleen: Unremarkable.

Adrenals/Urinary Tract: Bilateral kidneys and bilateral adrenal
glands are normal in appearance. There is no hydroureteronephrosis.
Urinary bladder is normal in appearance.

Stomach/Bowel: Normal appearance of the stomach. No pathologic
dilatation of small bowel or colon. Normal appendix.

Vascular/Lymphatic: Aortic atherosclerosis, without evidence of
aneurysm or dissection in the abdominal or pelvic vasculature. No
lymphadenopathy noted in the abdomen or pelvis.

Reproductive: Uterus and ovaries are unremarkable in appearance.

Other: No significant volume of ascites.  No pneumoperitoneum.

Musculoskeletal: There are no aggressive appearing lytic or blastic
lesions noted in the visualized portions of the skeleton.
IMPRESSION: 1. No acute findings are noted in the abdomen or pelvis to account
for the patient's symptoms.

## 2019-05-05 DIAGNOSIS — J4550 Severe persistent asthma, uncomplicated: Secondary | ICD-10-CM | POA: Diagnosis not present

## 2019-05-12 MED FILL — MONTELUKAST SOD 10 MG TAB: 10 | 30 days supply | Qty: 90 | Fill #3

## 2019-05-15 ENCOUNTER — Ambulatory Visit
Admission: RE | Admit: 2019-05-15 | Discharge: 2019-05-15 | Disposition: A | Discharge status: Home / Self Care | Payer: 59 | Source: Ambulatory Visit | Attending: Family | Admitting: Family

## 2019-05-15 DIAGNOSIS — Z1231 Encounter for screening mammogram for malignant neoplasm of breast: Secondary | ICD-10-CM | POA: Insufficient documentation

## 2019-05-15 DIAGNOSIS — Z0000 Encounter for general adult medical examination without abnormal findings: Secondary | ICD-10-CM | POA: Diagnosis not present

## 2019-06-02 ENCOUNTER — Other Ambulatory Visit: Payer: Self-pay | Admitting: Internal Medicine

## 2019-06-05 DIAGNOSIS — J4550 Severe persistent asthma, uncomplicated: Secondary | ICD-10-CM | POA: Diagnosis not present

## 2019-06-11 MED FILL — MONTELUKAST SOD 10 MG TAB: 10 | 90 days supply | Qty: 90 | Fill #0

## 2019-06-23 DIAGNOSIS — Z7984 Long term (current) use of oral hypoglycemic drugs: Secondary | ICD-10-CM | POA: Diagnosis not present

## 2019-06-23 DIAGNOSIS — H5203 Hypermetropia, bilateral: Secondary | ICD-10-CM | POA: Diagnosis not present

## 2019-06-23 DIAGNOSIS — E119 Type 2 diabetes mellitus without complications: Secondary | ICD-10-CM | POA: Diagnosis not present

## 2019-06-23 DIAGNOSIS — H04123 Dry eye syndrome of bilateral lacrimal glands: Secondary | ICD-10-CM | POA: Diagnosis not present

## 2019-06-23 DIAGNOSIS — H52223 Regular astigmatism, bilateral: Secondary | ICD-10-CM | POA: Diagnosis not present

## 2019-06-23 DIAGNOSIS — H524 Presbyopia: Secondary | ICD-10-CM | POA: Diagnosis not present

## 2019-06-23 DIAGNOSIS — H4603 Optic papillitis, bilateral: Secondary | ICD-10-CM | POA: Diagnosis not present

## 2019-06-25 LAB — HM DIABETES EYE EXAM

## 2019-07-01 ENCOUNTER — Other Ambulatory Visit: Payer: Self-pay | Admitting: Internal Medicine

## 2019-07-03 DIAGNOSIS — J4550 Severe persistent asthma, uncomplicated: Secondary | ICD-10-CM | POA: Diagnosis not present

## 2019-07-14 ENCOUNTER — Other Ambulatory Visit: Payer: Self-pay | Admitting: Family

## 2019-07-14 DIAGNOSIS — M25552 Pain in left hip: Secondary | ICD-10-CM

## 2019-07-16 ENCOUNTER — Telehealth: Payer: 59 | Admitting: Family

## 2019-07-16 DIAGNOSIS — R42 Dizziness and giddiness: Secondary | ICD-10-CM

## 2019-07-16 DIAGNOSIS — H538 Other visual disturbances: Secondary | ICD-10-CM

## 2019-07-16 DIAGNOSIS — R519 Headache, unspecified: Secondary | ICD-10-CM

## 2019-07-16 NOTE — Progress Notes (Signed)
Based on what you shared with me, I feel your condition warrants further evaluation and I recommend that you be seen for a face to face office visit.  Given that you were having a headache, dizziness, and blurred vision you need to be seen face-to-face to be evaluated.   NOTE: If you entered your credit card information for this eVisit, you will not be charged. You may see a "hold" on your card for the $35 but that hold will drop off and you will not have a charge processed.  If you are having a true medical emergency please call 911.     For an urgent face to face visit, [place] has four urgent care centers for your convenience:   . [facility]    [phone]                  Get Driving Directions  [phone] [place], [place] [address] . 10 am to 8 pm Monday-Friday . 12 pm to 8 pm Saturday-Sunday   . [facility] at [place]                  Get Driving Directions  [phone] [place], [place], [place] [address] . 8 am to 8 pm Monday-Friday . 9 am to 6 pm Saturday . 11 am to 6 pm Sunday   . [facility] at [place]                  Get Driving Directions   [address], [place] [address] . 8 am to 8 pm Monday-Friday . 8 am to 4 pm Saturday-Sunday    . [facility] at [place]                    Get Driving Directions  [id number]  [address], [place], [place] [address]  . Monday-Friday, 12 PM to 6 PM    Your e-visit answers were reviewed by a board certified advanced clinical practitioner to complete your personal care plan.  Thank you for using e-Visits.

## 2019-07-24 ENCOUNTER — Ambulatory Visit: Payer: 59 | Admitting: Internal Medicine

## 2019-07-24 ENCOUNTER — Encounter: Payer: Self-pay | Admitting: Internal Medicine

## 2019-07-24 ENCOUNTER — Other Ambulatory Visit: Payer: Self-pay

## 2019-07-24 VITALS — BP 158/98 | HR 84 | Temp 97.9°F | Ht 62.0 in | Wt 158.8 lb

## 2019-07-24 DIAGNOSIS — J0100 Acute maxillary sinusitis, unspecified: Secondary | ICD-10-CM

## 2019-07-24 DIAGNOSIS — H4710 Unspecified papilledema: Secondary | ICD-10-CM | POA: Diagnosis not present

## 2019-07-24 DIAGNOSIS — R05 Cough: Secondary | ICD-10-CM | POA: Diagnosis not present

## 2019-07-24 DIAGNOSIS — R059 Cough, unspecified: Secondary | ICD-10-CM

## 2019-07-24 DIAGNOSIS — J4520 Mild intermittent asthma, uncomplicated: Secondary | ICD-10-CM | POA: Diagnosis not present

## 2019-07-24 MED ORDER — AMOXICILLIN-POT CLAVULANATE 875-125 MG PO TABS: 1.0000 | ORAL_TABLET | Freq: Two times a day (BID) | ORAL | 0 refills | Status: AC

## 2019-07-24 MED ORDER — PANTOPRAZOLE SODIUM 40 MG PO TBEC: 40.0000 mg | DELAYED_RELEASE_TABLET | Freq: Every day | ORAL | 6 refills | Status: DC

## 2019-07-24 MED ORDER — ALBUTEROL SULFATE HFA 108 (90 BASE) MCG/ACT IN AERS: 2.0000 | INHALATION_SPRAY | RESPIRATORY_TRACT | 6 refills | Status: DC | PRN

## 2019-07-24 MED ORDER — PREDNISONE 20 MG PO TABS: 20.0000 mg | ORAL_TABLET | Freq: Every day | ORAL | 0 refills | Status: DC

## 2019-07-24 MED ORDER — GUAIFENESIN-DM 100-10 MG/5ML PO SYRP: 5.0000 mL | ORAL_SOLUTION | ORAL | 0 refills | Status: DC | PRN

## 2019-07-24 NOTE — Patient Instructions (Addendum)
PREDNISONE 20 mg daily for 10 days Augmentin 875 BID for 14 days Continue Nucalla injections Cough syrup as needed ALBUTEROL AS NEEDED

## 2019-07-24 NOTE — Progress Notes (Signed)
[facility] Consultation      Date: 07/24/2019,   MRN# [id number] Bridget Peters 01-06-1971    Admission                  Current  Bridget Peters is a 48 y.o. old female seen in consultation for asthma  Synopsis-patient being treated for severe persistent ASTHMA over last 2 years Ige levels 320, Eos count 200 Nucala Therapy started Aug 2019  tarted Aug 2019  CHIEF COMPLAINT:   Follow-up asthma Positive sinus infection   HISTORY OF PRESENT ILLNESS   Positive sinus infection Positive productive cough Positive sinus pain Mucus production green and yellow drainage  Wheezing is not present No shortness of breath at this time    She has received NUCALA injection did not have an adverse reaction  Patient did have allergic reaction to Xolair on June 13, 2016 Increased work of breathing chest tightness is therefore a swelling ER visit for observation   At this time asthma seems to be under control No exacerbation at this time   Study results discussed with patient from last year again Patient has an AHI of 10-16 based on position She no longer wants to pursue therapy at this time  IgE levels     MEDICATIONS    Home Medication:   Current Medication:  Current Outpatient Medications:  .  albuterol (PROVENTIL HFA;VENTOLIN HFA) 108 (90 Base) MCG/ACT inhaler, Inhale 2 puffs into the lungs every 4 (four) hours as needed for wheezing or shortness of breath., Disp: 1 Inhaler, Rfl: 6 .  albuterol (PROVENTIL) (2.5 MG/3ML) 0.083% nebulizer solution, Take 3 mLs (2.5 mg total) by nebulization every 6 (six) hours as needed for wheezing or shortness of breath., Disp: 75 mL, Rfl: 12 .  amLODipine (NORVASC) 2.5 MG tablet, Take 1 tablet (2.5 mg total) by mouth daily., Disp: 90 tablet, Rfl: 3 .  cetirizine (ZYRTEC ALLERGY) 10 MG tablet, Take 1 tablet (10 mg total) by mouth daily., Disp: 30 tablet, Rfl: 6 .  EPIPEN 2-PAK 0.3 MG/0.3ML SOAJ  injection, , Disp: , Rfl: 11 .  fexofenadine (ALLEGRA) 180 MG tablet, Take 1 tablet (180 mg total) by mouth daily., Disp: 30 tablet, Rfl: 0 .  fluticasone (VERAMYST) 27.5 MCG/SPRAY nasal spray, Place 2 sprays into the nose daily., Disp: 10 g, Rfl: 12 .  Fluticasone Propionate 93 MCG/ACT EXHU, Place 1 Act into the nose daily., Disp: , Rfl:  .  gabapentin (NEURONTIN) 100 MG capsule, TAKE 1 CAPSULE BY MOUTH AT BEDTIME, Disp: 90 capsule, Rfl: 1 .  INVOKANA 300 MG TABS tablet, TAKE 1 TABLET BY MOUTH DAILY., Disp: 90 tablet, Rfl: 3 .  ipratropium-albuterol (DUONEB) 0.5-2.5 (3) MG/3ML SOLN, Take 3 mLs by nebulization every 4 (four) hours as needed., Disp: 120 mL, Rfl: 1 .  levothyroxine (SYNTHROID) 75 MCG tablet, TAKE 1 TABLET BY MOUTH ONCE DAILY, Disp: 90 tablet, Rfl: 1 .  pantoprazole (PROTONIX) 40 MG tablet, TAKE 1 TABLET BY MOUTH DAILY., Disp: 30 tablet, Rfl: 0 .  silver sulfADIAZINE (SILVADENE) 1 % cream, Apply 1 application topically daily., Disp: 50 g, Rfl: 0 .  Tiotropium Bromide Monohydrate 1.25 MCG/ACT AERS, Inhale 1.25 mcg into the lungs daily., Disp: , Rfl:   Current Facility-Administered Medications:  Marland Kitchen  Mepolizumab SOLR 100 mg, 100 mg, Subcutaneous, Q28 days, Kenijah Benningfield, MD, 100 mg at 08/03/17 [id number]    ALLERGIES   Milk-related compounds, Shellfish allergy, Tamiflu [oseltamivir phosphate], Wheat bran, Xolair [omalizumab], and Isovue [iopamidol]  REVIEW OF SYSTEMS   Review of Systems  Constitutional: Negative for chills, fever, malaise/fatigue and weight loss.  HENT: Negative for congestion.   Respiratory: Positive for cough. Negative for hemoptysis, sputum production, shortness of breath and wheezing.   Cardiovascular: Negative for chest pain.  Gastrointestinal: Negative for heartburn, nausea and vomiting.  Endo/Heme/Allergies: Negative.   All other systems reviewed and are negative.   .vs BP (!) 158/98   Pulse 84   Temp 97.9 F (36.6 C) (Temporal)   Ht 5\' 2"  (1.575  m)   Wt 158 lb 12.8 oz (72 kg)   SpO2 97%   BMI 29.04 kg/m    PHYSICAL EXAM  Physical Exam  Constitutional: She is oriented to person, place, and time. She appears well-developed and well-nourished. No distress.  HENT:  Mouth/Throat: No oropharyngeal exudate.  Neck: Neck supple.  Cardiovascular: Normal rate, regular rhythm and normal heart sounds.  No murmur heard. Pulmonary/Chest: Effort normal and breath sounds normal. No stridor. No respiratory distress. She has no wheezes.  Musculoskeletal: Normal range of motion.  Neurological: She is alert and oriented to person, place, and time. No cranial nerve deficit.  Skin: Skin is warm. She is not diaphoretic.  Psychiatric: She has a normal mood and affect.     IgE levels=320 Eosinophil count=200  IgE     ASSESSMENT/PLAN    48 year old white female seen today for acute sinus infection In the setting of mild intermittent asthma which is well controlled with NUCALA immunotherapy injections and albuterol as needed with Spiriva  Acute sinus infection Prednisone 20 mg daily for 10 days Augmentin 875 twice daily for 14 days Cough syrup as needed    I discussed sleep study findings with the patient At this time patient has moderate to severe sleep apnea with AHI of 10-16 Patient absolutely refuses to get therapy at this time She has had many issues with the masks And does not want to revisit this at this time   Mild intermittent asthma Well-controlled with immunological therapy Continue Spiriva Avoid all triggers  Allergic rhinitis Continue Allegra and Veramyst   GERD Protonix refilled  COVID-19 EDUCATION: The signs and symptoms of COVID-19 were discussed with the patient and how to seek care for testing.  The importance of social distancing was discussed today. Hand Washing Techniques and avoid touching face was advised.     MEDICATION ADJUSTMENTS/LABS AND TESTS ORDERED: Prednisone 20 mg daily for 10 days  Augmentin 875 mg p.o. twice daily Albuterol as needed Cough syrup as needed Continue immunological injection therapy Follow-up allergy asthma clinic   CURRENT MEDICATIONS REVIEWED AT LENGTH WITH PATIENT TODAY   Patient satisfied with Plan of action and management. All questions answered  Follow up in 6 months   Kylan Veach Patricia Pesa, M.D.  [facility]  Medical Director [place] Director [facility] Cardio-Pulmonary Department

## 2019-07-31 DIAGNOSIS — J4550 Severe persistent asthma, uncomplicated: Secondary | ICD-10-CM | POA: Diagnosis not present

## 2019-08-02 ENCOUNTER — Other Ambulatory Visit: Payer: Self-pay | Admitting: Internal Medicine

## 2019-08-02 DIAGNOSIS — J0100 Acute maxillary sinusitis, unspecified: Secondary | ICD-10-CM

## 2019-08-07 DIAGNOSIS — H4603 Optic papillitis, bilateral: Secondary | ICD-10-CM | POA: Diagnosis not present

## 2019-08-07 DIAGNOSIS — E119 Type 2 diabetes mellitus without complications: Secondary | ICD-10-CM | POA: Diagnosis not present

## 2019-08-07 DIAGNOSIS — H04123 Dry eye syndrome of bilateral lacrimal glands: Secondary | ICD-10-CM | POA: Diagnosis not present

## 2019-08-07 DIAGNOSIS — Z7984 Long term (current) use of oral hypoglycemic drugs: Secondary | ICD-10-CM | POA: Diagnosis not present

## 2019-08-13 DIAGNOSIS — J301 Allergic rhinitis due to pollen: Secondary | ICD-10-CM | POA: Diagnosis not present

## 2019-08-13 DIAGNOSIS — J329 Chronic sinusitis, unspecified: Secondary | ICD-10-CM | POA: Diagnosis not present

## 2019-08-13 DIAGNOSIS — H1013 Acute atopic conjunctivitis, bilateral: Secondary | ICD-10-CM | POA: Diagnosis not present

## 2019-08-13 DIAGNOSIS — J3081 Allergic rhinitis due to animal (cat) (dog) hair and dander: Secondary | ICD-10-CM | POA: Diagnosis not present

## 2019-08-13 DIAGNOSIS — R091 Pleurisy: Secondary | ICD-10-CM | POA: Diagnosis not present

## 2019-08-13 DIAGNOSIS — J4550 Severe persistent asthma, uncomplicated: Secondary | ICD-10-CM | POA: Diagnosis not present

## 2019-08-13 DIAGNOSIS — J3089 Other allergic rhinitis: Secondary | ICD-10-CM | POA: Diagnosis not present

## 2019-08-13 DIAGNOSIS — Z91018 Allergy to other foods: Secondary | ICD-10-CM | POA: Diagnosis not present

## 2019-08-28 DIAGNOSIS — J4550 Severe persistent asthma, uncomplicated: Secondary | ICD-10-CM | POA: Diagnosis not present

## 2019-09-18 ENCOUNTER — Encounter: Payer: Self-pay | Admitting: Family

## 2019-09-23 ENCOUNTER — Ambulatory Visit: Payer: 59 | Admitting: Internal Medicine

## 2019-09-25 DIAGNOSIS — J4550 Severe persistent asthma, uncomplicated: Secondary | ICD-10-CM | POA: Diagnosis not present

## 2019-10-08 ENCOUNTER — Other Ambulatory Visit: Payer: Self-pay | Admitting: Family

## 2019-10-08 DIAGNOSIS — E039 Hypothyroidism, unspecified: Secondary | ICD-10-CM

## 2019-10-13 NOTE — Telephone Encounter (Signed)
DK, please advise. Thanks 

## 2019-10-21 ENCOUNTER — Emergency Department
Admission: EM | Admit: 2019-10-21 | Discharge: 2019-10-21 | Disposition: A | Discharge status: Home / Self Care | Payer: PRIVATE HEALTH INSURANCE | Attending: Emergency Medicine | Admitting: Emergency Medicine

## 2019-10-21 ENCOUNTER — Other Ambulatory Visit: Payer: Self-pay

## 2019-10-21 DIAGNOSIS — R202 Paresthesia of skin: Secondary | ICD-10-CM | POA: Diagnosis present

## 2019-10-21 DIAGNOSIS — T7840XA Allergy, unspecified, initial encounter: Secondary | ICD-10-CM | POA: Diagnosis not present

## 2019-10-21 DIAGNOSIS — I10 Essential (primary) hypertension: Secondary | ICD-10-CM | POA: Insufficient documentation

## 2019-10-21 DIAGNOSIS — E039 Hypothyroidism, unspecified: Secondary | ICD-10-CM | POA: Insufficient documentation

## 2019-10-21 DIAGNOSIS — R0989 Other specified symptoms and signs involving the circulatory and respiratory systems: Secondary | ICD-10-CM | POA: Diagnosis not present

## 2019-10-21 DIAGNOSIS — E119 Type 2 diabetes mellitus without complications: Secondary | ICD-10-CM | POA: Diagnosis not present

## 2019-10-21 DIAGNOSIS — Z79899 Other long term (current) drug therapy: Secondary | ICD-10-CM | POA: Diagnosis not present

## 2019-10-21 DIAGNOSIS — R519 Headache, unspecified: Secondary | ICD-10-CM | POA: Insufficient documentation

## 2019-10-21 MED ORDER — PREDNISONE 20 MG PO TABS
60.0000 mg | ORAL_TABLET | Freq: Once | ORAL | Status: AC
  Administered 2019-10-21: 15:00:00 60 mg via ORAL
  Filled 2019-10-21: qty 3

## 2019-10-21 MED ORDER — PREDNISONE 20 MG PO TABS: 40.0000 mg | ORAL_TABLET | Freq: Every day | ORAL | 0 refills | Status: DC

## 2019-10-21 MED ORDER — DIPHENHYDRAMINE HCL 25 MG PO CAPS
50.0000 mg | ORAL_CAPSULE | Freq: Once | ORAL | Status: AC
  Administered 2019-10-21: 15:00:00 50 mg via ORAL
  Filled 2019-10-21: qty 2

## 2019-10-21 MED ORDER — BUTALBITAL-APAP-CAFFEINE 50-325-40 MG PO TABS
1.0000 | ORAL_TABLET | Freq: Once | ORAL | Status: AC
  Administered 2019-10-21: 18:00:00 1 via ORAL
  Filled 2019-10-21: qty 1

## 2019-10-21 MED ORDER — FAMOTIDINE 20 MG PO TABS
20.0000 mg | ORAL_TABLET | Freq: Once | ORAL | Status: AC
  Administered 2019-10-21: 15:00:00 20 mg via ORAL
  Filled 2019-10-21: qty 1

## 2019-10-21 NOTE — ED Notes (Signed)

## 2019-10-21 NOTE — ED Notes (Signed)
Pt's eyes looking much better, do not appear red anymore.

## 2019-10-21 NOTE — ED Notes (Signed)
Pts eyes bilaterally appear to be red, pt states she noticed the same after the covid vaccine. No rashes noted, pt denies itching, states her tingling seems to be getting better as well.

## 2019-10-21 NOTE — ED Notes (Signed)
Pt laying in bed with only c/o headache at this time. Pain med given per order. Crackers and water given as well. Pt tolerating well. Bed locked and low.

## 2019-10-21 NOTE — ED Provider Notes (Signed)
[facility] Emergency Department Provider Note  ____________________________________________   I have reviewed the triage vital signs and the nursing notes.   HISTORY  Chief Complaint Allergic Reaction   History limited by: Not Limited   HPI [name] is a 49 y.o. female who presents to the emergency department today because of concerns for possible allergic reaction.  The patient got her first dose of the Covid vaccine from [place] this morning.  She states roughly 1 hour after getting it she noticed some tingling in her throat.  She also felt some scratchiness in her throat.  She then developed a headache.  Patient states she has known allergies.  She did receive medication on triage and states that her itchiness and scratchiness is better although she continues to have complaints of headache.   Records reviewed. Per medical record review patient has a history of allergies, asthma.   Past Medical History:  Diagnosis Date  . Allergy    Seasonal  . Asthma   . Depression   . Diabetes mellitus without complication ([place])    Pt takes Metformin.  Marland Kitchen Hypertension   . Pneumonia   . Shortness of breath   . Sleep apnea   . Thyroid disease     Patient Active Problem List   Diagnosis Date Noted  . PMB (postmenopausal bleeding) 11/04/2018  . Routine physical examination 05/20/2018  . Vaginal bleeding 05/20/2018  . Aortic atherosclerosis ([place]) 04/15/2018  . Sinus congestion 02/13/2018  . Bronchitis 08/20/2017  . Osteopenia 05/14/2017  . OSA (obstructive sleep apnea) 04/24/2017  . Elevated hemoglobin ([place]) 03/09/2017  . Fatty liver 01/17/2017  . Fatigue 01/01/2017  . HTN (hypertension) 01/01/2017  . 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 Anxiety and depression 07/11/2016  . Allergic reaction 06/13/2016  . Hypercholesteremia 04/26/2016  . Screening mammogram, encounter for 07/14/2015  . Hypothyroidism 07/14/2015  . Diabetes mellitus type 2,  controlled, without complications ([place]) [id number]  . Multiple environmental allergies 07/14/2015  . Asthma, moderate persistent 07/14/2015    Past Surgical History:  Procedure Laterality Date  . BREAST SURGERY  01/2013   Breast Reduction   . NASAL SINUS SURGERY    . NASAL SINUS SURGERY  02/2014  . REDUCTION MAMMAPLASTY Bilateral 2014    Prior to Admission medications   Medication Sig Start Date End Date Taking? Authorizing Provider  albuterol (PROVENTIL HFA;VENTOLIN HFA) 108 (90 Base) MCG/ACT inhaler Inhale 2 puffs into the lungs every 4 (four) hours as needed for wheezing or shortness of breath. 11/01/18   Withrow, Elyse Jarvis, FNP  albuterol (PROVENTIL) (2.5 MG/3ML) 0.083% nebulizer solution Take 3 mLs (2.5 mg total) by nebulization every 6 (six) hours as needed for wheezing or shortness of breath. 11/01/18   With[place], Elyse Jarvis, FNP  albuterol (VENTOLIN HFA) 108 (90 Base) MCG/ACT inhaler Inhale 2 puffs into the lungs every 4 (four) hours as needed for wheezing or shortness of breath. 07/24/19   Flora Lipps, MD  amLODipine (NORVASC) 2.5 MG tablet Take 1 tablet (2.5 mg total) by mouth daily. 03/21/19   Burnard Hawthorne, FNP  cetirizine (ZYRTEC ALLERGY) 10 MG tablet Take 1 tablet (10 mg total) by mouth daily. 02/19/18   Flora Lipps, MD  EPIPEN 2-PAK 0.3 MG/0.3ML SOAJ injection  05/04/17   [provider]  fexofenadine (ALLEGRA) 180 MG tablet Take 1 tablet (180 mg total) by mouth daily. 12/18/17   Flora Lipps, MD  fluticasone (VERAMYST) 27.5 MCG/SPRAY nasal spray Place 2 sprays into the nose  daily. 08/06/15   Flora Lipps, MD  Fluticasone Propionate 93 MCG/ACT EXHU Place 1 Act into the nose daily.    [provider]  gabapentin (NEURONTIN) 100 MG capsule TAKE 1 CAPSULE BY MOUTH AT BEDTIME 07/14/19   Burnard Hawthorne, FNP  guaiFENesin-dextromethorphan (ROBITUSSIN DM) 100-10 MG/5ML syrup Take 5 mLs by mouth every 4 (four) hours as needed for cough. 07/24/19   Flora Lipps, MD   INVOKANA 300 MG TABS tablet TAKE 1 TABLET BY MOUTH DAILY. 10/08/18   Burnard Hawthorne, FNP  ipratropium-albuterol (DUONEB) 0.5-2.5 (3) MG/3ML SOLN Take 3 mLs by nebulization every 4 (four) hours as needed. 11/12/18   Jodelle Green, FNP  levothyroxine (SYNTHROID) 75 MCG tablet TAKE 1 TABLET BY MOUTH ONCE DAILY 10/08/19   Burnard Hawthorne, FNP  pantoprazole (PROTONIX) 40 MG tablet Take 1 tablet (40 mg total) by mouth daily. 07/24/19   Flora Lipps, MD  predniSONE (DELTASONE) 20 MG tablet Take 1 tablet (20 mg total) by mouth daily with breakfast. 10 days 07/24/19   Flora Lipps, MD  silver sulfADIAZINE (SILVADENE) 1 % cream Apply 1 application topically daily. 05/02/18   Brunetta Jeans, PA-C  Tiotropium Bromide Monohydrate 1.25 MCG/ACT AERS Inhale 1.25 mcg into the lungs daily.    [provider]    Allergies Milk-related compounds, Shellfish allergy, Tamiflu [oseltamivir phosphate], Wheat bran, Xolair [omalizumab], and Isovue [iopamidol]  Family History  Problem Relation Age of Onset  . Hypertension Mother   . Endometrial cancer Mother 3  . Hypertension Father   . Colon polyps Father   . Cancer Maternal Aunt        Breast Cancer  . Breast cancer Maternal Aunt   . Breast cancer Paternal Aunt        Breast Cancer  . Cancer Maternal Grandmother 75       Bladder Cancer  . Stroke Maternal Grandmother   . Breast cancer Maternal Grandmother 60  . Cancer Maternal Grandfather 1       Esophagus Cancer  . Colon cancer Neg Hx   . Ovarian cancer Neg Hx     Social History Social History   Tobacco Use  . Smoking status: Never Smoker  . Smokeless tobacco: Never Used  Substance Use Topics  . Alcohol use: Yes    Alcohol/week: 0.0 standard drinks  . Drug use: No    Review of Systems Constitutional: No fever/chills Eyes: No visual changes. ENT: Positive for scratchy throat, tongue tingling.  Cardiovascular: Denies chest pain. Respiratory: Denies shortness of  breath. Gastrointestinal: No abdominal pain.  No nausea, no vomiting.  No diarrhea.   Genitourinary: Negative for dysuria. Musculoskeletal: Negative for back pain. Skin: Negative for rash. Neurological: Positive for headache.  ____________________________________________   PHYSICAL EXAM:  VITAL SIGNS: ED Triage Vitals  Enc Vitals Group     BP 10/21/19 1422 (!) 190/87     Pulse Rate 10/21/19 1422 93     Resp 10/21/19 1422 20     Temp 10/21/19 1422 97.6 F (36.4 C)     Temp Source 10/21/19 1422 Oral     SpO2 10/21/19 1422 100 %     Weight 10/21/19 1423 150 lb (68 kg)     Height 10/21/19 1423 5\' 2"  (1.575 m)     Head Circumference --      Peak Flow --      Pain Score 10/21/19 1505 0   Constitutional: Alert and oriented.  Eyes: Conjunctivae are normal.  ENT  Head: Normocephalic and atraumatic.      Nose: No congestion/rhinnorhea.      Mouth/Throat: Mucous membranes are moist.      Neck: No stridor. Hematological/Lymphatic/Immunilogical: No cervical lymphadenopathy. Cardiovascular: Normal rate, regular rhythm.  No murmurs, rubs, or gallops.  Respiratory: Normal respiratory effort without tachypnea nor retractions. Breath sounds are clear and equal bilaterally. No wheezes/rales/rhonchi. Gastrointestinal: Soft and non tender. No rebound. No guarding.  Genitourinary: Deferred Musculoskeletal: Normal range of motion in all extremities. No lower extremity edema. Neurologic:  Normal speech and language. No gross focal neurologic deficits are appreciated.  Skin:  Skin is warm, dry and intact. No rash noted. Psychiatric: Mood and affect are normal. Speech and behavior are normal. Patient exhibits appropriate insight and judgment.  ____________________________________________    LABS (pertinent positives/negatives)  None  ____________________________________________   EKG  None  ____________________________________________     RADIOLOGY  None  ____________________________________________   PROCEDURES  Procedures  ____________________________________________   INITIAL IMPRESSION / ASSESSMENT AND PLAN / ED COURSE  Pertinent labs & imaging results that were available during my care of the patient were reviewed by me and considered in my medical decision making (see chart for details).   Patient presented to the emergency department today because of concerns for allergic reaction after receiving the first dose of the Covid vaccine.  By the time my exam she had already received medications in triage and states she did feel somewhat better.  She continued to have a headache.  She was given Fioricet which helped with her headache.  She was observed for 4 hours without any recurrence of symptoms.  At this point I think it is safe for patient to be discharged home.  She does have EpiPen at home.  Discussed return precautions.   ____________________________________________   FINAL CLINICAL IMPRESSION(S) / ED DIAGNOSES  Final diagnoses:  Allergic reaction, initial encounter     Note: This dictation was prepared with Dragon dictation. Any transcriptional errors that result from this process are unintentional     Nance Pear, MD 10/21/19 1943

## 2019-10-21 NOTE — ED Notes (Signed)
Says the tingling improved, but now she has headache.

## 2019-10-21 NOTE — ED Triage Notes (Signed)
Pt in by HAW post COVID vaccine with weakness and a tingling sensation to tongue.

## 2019-10-21 NOTE — ED Triage Notes (Signed)
Pt states she had the covid vaccine #1 today around 1130a and about 58min after began having tingling of her tongue and itching all over,. No visible rash, denies any trouble breathing or swallowing at this time, c/o throat itching.

## 2019-10-21 NOTE — Discharge Instructions (Addendum)
Please seek medical attention for any high fevers, chest pain, shortness of breath, change in behavior, persistent vomiting, bloody stool or any other new or concerning symptoms.  

## 2019-10-22 ENCOUNTER — Encounter: Payer: Self-pay | Admitting: Family

## 2019-10-22 ENCOUNTER — Ambulatory Visit (INDEPENDENT_AMBULATORY_CARE_PROVIDER_SITE_OTHER): Payer: 59 | Admitting: Family

## 2019-10-22 VITALS — Ht 62.0 in | Wt 148.0 lb

## 2019-10-22 DIAGNOSIS — I10 Essential (primary) hypertension: Secondary | ICD-10-CM

## 2019-10-22 DIAGNOSIS — M25561 Pain in right knee: Secondary | ICD-10-CM | POA: Diagnosis not present

## 2019-10-22 DIAGNOSIS — M25552 Pain in left hip: Secondary | ICD-10-CM

## 2019-10-22 DIAGNOSIS — T8069XA Other serum reaction due to other serum, initial encounter: Secondary | ICD-10-CM | POA: Diagnosis not present

## 2019-10-22 DIAGNOSIS — E119 Type 2 diabetes mellitus without complications: Secondary | ICD-10-CM | POA: Diagnosis not present

## 2019-10-22 MED ORDER — MELOXICAM 7.5 MG PO TABS: 7.5000 mg | ORAL_TABLET | Freq: Every day | ORAL | 1 refills | Status: DC | PRN

## 2019-10-22 MED ORDER — GABAPENTIN 100 MG PO CAPS: 100.0000 mg | ORAL_CAPSULE | Freq: Every day | ORAL | 1 refills | Status: DC

## 2019-10-22 NOTE — Assessment & Plan Note (Addendum)
No respiratory distress.  She was not labored her speech today.  She appears stable.  She does have a pulse oximeter at home and I advised her to monitor that when she was home and certainly if it drops below 90, she understands to go to emergency room.   Consulted with pharmacist, Catie, and advised her NOT to have [place] tomorrow and to speak with her allergist first.  Concern that after an allergic reaction she could have anaphylaxis from [place].  Also advised her to speak with her allergist and pulmonologist in regards to his second Covid vaccine.  At this time, I do not feel comfortable advising her.  She certainly has risk for severe disease however with her allergy history, I am concerned that she could have a more severe allergic reaction with second dose.  We discussed briefly drawing her Covid antibodies in the next couple weeks to see helpful in discerning immunity.

## 2019-10-22 NOTE — Assessment & Plan Note (Signed)
Pending a1c. 

## 2019-10-22 NOTE — Assessment & Plan Note (Signed)
Acute.  Medial.  Discussed with her degeneration and/or meniscal etiology.  Advised conservative therapy with NSAID limited use, neoprene sleeve, icing regimen.  Pending x-ray.  If no improvement, we discussed MRI and consult with orthopedics.  She will let me know.

## 2019-10-22 NOTE — Progress Notes (Signed)
Virtual Visit via Video Note  I connected with@  on 10/22/19 at  4:00 PM EST by a video enabled telemedicine application and verified that I am speaking with the correct person using two identifiers.  Location patient: home Location provider:work  Persons participating in the virtual visit: patient, provider  I discussed the limitations of evaluation and management by telemedicine and the availability of in person appointments. The patient expressed understanding and agreed to proceed.   HPI: CC: right medial knee pain x 3 months, unchanged. Steps are very painful, and worse  'grinding and pulling.'  No swelling, increased heat, numbness. Skin intact. No falls., known injury.  Rest helps. Tried ice and heat, knee brace with some relief.  No h/o gout.  Still feels headache, fatigue from COVID vaccine yesterday. Given prednisone, benadryl, pepcid, helped with tingling.  Feels SOB today, didn't have this yesterday, which is more noticeable with activity. This is worse than baseline for her. SOB has been well controlled on the [place]. No CP.  Follows with Allergist , Pulmonology  ED for suspected COVID-19 vaccine reaction with tingling in her throat.  She carries an EpiPen  Has been seeing ophthalmologist for vision changes and concern for MS.  Has lost weight intentionally. Some hip still however improved with weight loss.   HTN- 110/70 at home. No cp.    ROS: See pertinent positives and negatives per HPI.  Past Medical History:  Diagnosis Date  . Allergy    Seasonal  . Asthma   . Depression   . Diabetes mellitus without complication ([place])    Pt takes Metformin.  Marland Kitchen Hypertension   . Pneumonia   . Shortness of breath   . Sleep apnea   . Thyroid disease     Past Surgical History:  Procedure Laterality Date  . BREAST SURGERY  01/2013   Breast Reduction   . NASAL SINUS SURGERY    . NASAL SINUS SURGERY  02/2014  . REDUCTION MAMMAPLASTY Bilateral 2014    Family History   Problem Relation Age of Onset  . Hypertension Mother   . Endometrial cancer Mother 56  . Hypertension Father   . Colon polyps Father   . Cancer Maternal Aunt        Breast Cancer  . Breast cancer Maternal Aunt   . Breast cancer Paternal Aunt        Breast Cancer  . Cancer Maternal Grandmother 55       Bladder Cancer  . Stroke Maternal Grandmother   . Breast cancer Maternal Grandmother 60  . Cancer Maternal Grandfather 56       Esophagus Cancer  . Colon cancer Neg Hx   . Ovarian cancer Neg Hx     SOCIAL HX: non smoker   Current Outpatient Medications:  .  albuterol (PROVENTIL) (2.5 MG/3ML) 0.083% nebulizer solution, Take 3 mLs (2.5 mg total) by nebulization every 6 (six) hours as needed for wheezing or shortness of breath., Disp: 75 mL, Rfl: 12 .  amLODipine (NORVASC) 2.5 MG tablet, Take 1 tablet (2.5 mg total) by mouth daily., Disp: 90 tablet, Rfl: 3 .  cetirizine (ZYRTEC ALLERGY) 10 MG tablet, Take 1 tablet (10 mg total) by mouth daily., Disp: 30 tablet, Rfl: 6 .  EPIPEN 2-PAK 0.3 MG/0.3ML SOAJ injection, , Disp: , Rfl: 11 .  fexofenadine (ALLEGRA) 180 MG tablet, Take 1 tablet (180 mg total) by mouth daily., Disp: 30 tablet, Rfl: 0 .  Fluticasone Propionate 93 MCG/ACT EXHU, Place 1 Act  into the nose daily., Disp: , Rfl:  .  gabapentin (NEURONTIN) 100 MG capsule, Take 1 capsule (100 mg total) by mouth at bedtime., Disp: 90 capsule, Rfl: 1 .  INVOKANA 300 MG TABS tablet, TAKE 1 TABLET BY MOUTH DAILY., Disp: 90 tablet, Rfl: 3 .  levothyroxine (SYNTHROID) 75 MCG tablet, TAKE 1 TABLET BY MOUTH ONCE DAILY, Disp: 90 tablet, Rfl: 0 .  pantoprazole (PROTONIX) 40 MG tablet, Take 1 tablet (40 mg total) by mouth daily., Disp: 30 tablet, Rfl: 6 .  predniSONE (DELTASONE) 20 MG tablet, Take 2 tablets (40 mg total) by mouth daily., Disp: 8 tablet, Rfl: 0 .  silver sulfADIAZINE (SILVADENE) 1 % cream, Apply 1 application topically daily., Disp: 50 g, Rfl: 0 .  Tiotropium Bromide Monohydrate 1.25  MCG/ACT AERS, Inhale 1.25 mcg into the lungs daily., Disp: , Rfl:  .  meloxicam (MOBIC) 7.5 MG tablet, Take 1 tablet (7.5 mg total) by mouth daily as needed for pain., Disp: 30 tablet, Rfl: 1  Current Facility-Administered Medications:  Marland Kitchen  Mepolizumab SOLR 100 mg, 100 mg, Subcutaneous, Q28 days, Kasa, Kurian, MD, 100 mg at 08/03/17 [id number]  EXAM:  VITALS per patient if applicable:  GENERAL: alert, oriented, appears well and in no acute distress  HEENT: atraumatic, conjunttiva clear, no obvious abnormalities on inspection of external nose and ears  NECK: normal movements of the head and neck  LUNGS: on inspection no signs of respiratory distress, breathing rate appears normal, no obvious gross SOB, gasping or wheezing  CV: no obvious cyanosis  MS: moves all visible extremities without noticeable abnormality  PSYCH/NEURO: pleasant and cooperative, no obvious depression or anxiety, speech and thought processing grossly intact  ASSESSMENT AND PLAN:  Discussed the following assessment and plan:  Acute pain of right knee - Plan: gabapentin (NEURONTIN) 100 MG capsule, meloxicam (MOBIC) 7.5 MG tablet, CBC with Differential/Platelet, Comprehensive metabolic panel-FUTURE, Hemoglobin A1c, Lipid panel-future, TSH-future, VITAMIN D 25 Hydroxy (Vit-D Deficiency, Fractures), DG Knee Complete 4 Views Right, CANCELED: DG Knee Complete 4 Views Right, CANCELED: CBC with Differential/Platelet, CANCELED: Comprehensive metabolic panel-FUTURE, CANCELED: Hemoglobin A1c, CANCELED: Lipid panel-future, CANCELED: TSH-future  Left hip pain - Plan: gabapentin (NEURONTIN) 100 MG capsule  Essential hypertension  Controlled type 2 diabetes mellitus without complication, without long-term current use of insulin (HCC)  Allergic reaction to vaccine Problem List Items Addressed This Visit      Cardiovascular and Mediastinum   HTN (hypertension)    BP Readings from Last 3 Encounters:  10/21/19 (!) 170/71  07/24/19  (!) 158/98  03/21/19 (!) 142/80  at home, she is running much lower.  She will continue to monitor and let me know if > 120/80.         Endocrine   Diabetes mellitus type 2, controlled, without complications ([place])    Pending a1c        Other   Allergic reaction to vaccine    No respiratory distress.  She was not labored her speech today.  She appears stable.  She does have a pulse oximeter at home and I advised her to monitor that when she was home and certainly if it drops below 90, she understands to go to emergency room.   Consulted with pharmacist, Catie, and advised her NOT to have [place] tomorrow and to speak with her allergist first.  Concern that after an allergic reaction she could have anaphylaxis from [place].  Also advised her to speak with her allergist and pulmonologist in regards to his second  Covid vaccine.  At this time, I do not feel comfortable advising her.  She certainly has risk for severe disease however with her allergy history, I am concerned that she could have a more severe allergic reaction with second dose.  We discussed briefly drawing her Covid antibodies in the next couple weeks to see helpful in discerning immunity.      Right knee pain - Primary    Acute.  Medial.  Discussed with her degeneration and/or meniscal etiology.  Advised conservative therapy with NSAID limited use, neoprene sleeve, icing regimen.  Pending x-ray.  If no improvement, we discussed MRI and consult with orthopedics.  She will let me know.      Relevant Medications   gabapentin (NEURONTIN) 100 MG capsule   meloxicam (MOBIC) 7.5 MG tablet   Other Relevant Orders   CBC with Differential/Platelet   Comprehensive metabolic panel-FUTURE   Hemoglobin A1c   Lipid panel-future   TSH-future   VITAMIN D 25 Hydroxy (Vit-D Deficiency, Fractures)   DG Knee Complete 4 Views Right    Other Visit Diagnoses    Left hip pain       Relevant Medications   gabapentin (NEURONTIN) 100 MG capsule       -we discussed possible serious and likely etiologies, options for evaluation and workup, limitations of telemedicine visit vs in person visit, treatment, treatment risks and precautions. Pt prefers to treat via telemedicine empirically rather then risking or undertaking an in person visit at this moment. Patient agrees to seek prompt in person care if worsening, new symptoms arise, or if is not improving with treatment.   I discussed the assessment and treatment plan with the patient. The patient was provided an opportunity to ask questions and all were answered. The patient agreed with the plan and demonstrated an understanding of the instructions.   The patient was advised to call back or seek an in-person evaluation if the symptoms worsen or if the condition fails to improve as anticipated.   Mable Paris, FNP

## 2019-10-22 NOTE — Assessment & Plan Note (Addendum)
BP Readings from Last 3 Encounters:  10/21/19 (!) 170/71  07/24/19 (!) 158/98  03/21/19 (!) 142/80  at home, she is running much lower.  She will continue to monitor and let me know if > 120/80.

## 2019-10-23 ENCOUNTER — Encounter: Payer: Self-pay | Admitting: Family

## 2019-10-24 ENCOUNTER — Telehealth: Payer: Self-pay

## 2019-10-24 NOTE — Telephone Encounter (Signed)
LMTCB in regards to mychart message.

## 2019-10-25 ENCOUNTER — Ambulatory Visit: Payer: 59 | Admitting: Family Medicine

## 2019-10-25 ENCOUNTER — Other Ambulatory Visit: Payer: Self-pay

## 2019-10-27 ENCOUNTER — Telehealth: Payer: Self-pay

## 2019-10-27 ENCOUNTER — Telehealth: Payer: Self-pay | Admitting: Family

## 2019-10-27 DIAGNOSIS — R202 Paresthesia of skin: Secondary | ICD-10-CM

## 2019-10-27 NOTE — Telephone Encounter (Signed)
Sharee Pimple,   I have been rying to reach pt. See mychart messages and calls that Judson Roch made.   Who has soonest appt ? Anything tomorrow in our office?    I am Concerned very much with weakness, right arm in particular since having covid vaccine last week. I would like patient to to urgent care, preferably ED as she may need stat imaging, even MRI, labs  Unable to reach pt.  Tried [phone] and no vm Left vm #[id number].

## 2019-10-27 NOTE — Telephone Encounter (Signed)
I called patient to see if she was able to get an appointment with [facility]. I can see nothing scheduled in Epic. I asked that she please call us back because we were concerned about her.

## 2019-10-27 NOTE — Telephone Encounter (Signed)
NP Mable Paris spoke to Pt.

## 2019-10-27 NOTE — Telephone Encounter (Signed)
Spoke with pt.  HAW has reported side effects to be tracked.  Had right arm [place] today through [place], didn't see provider. Told no blood clot per patient.  Breathing has improved. Feels sob more in the morning. Completed prednisone. Had some tingling in right arm and weakness , started yesterday, worse. NO rash, swelling. Tingling in hands and fingers started after vaccine last week, 6 days ago. Right Hand feels cold.  No neck pain.   Wandering if MS as told by ophthalmology is a concern, and thinks perhaps aggravated by COVID.  Hasnt had [place], has this tomorrow.  Seeing ophthalmologist in 3 days and plans to discuss optic nerve and MRI Brain per patient.   Declines MRI brain from me today or consult with neurology; she would like to see ophthalmology and go from there.  Expressed my concern especially with new COVID-19 vaccine  Discussed with patient if this is GBS.  Does not appear to be Bell's palsy  Advised that this becomes progressive or symptom were to worsen in any way, she is to let me know

## 2019-10-28 ENCOUNTER — Telehealth: Payer: Self-pay | Admitting: Family

## 2019-10-28 ENCOUNTER — Other Ambulatory Visit: Payer: Self-pay | Admitting: Family

## 2019-10-28 DIAGNOSIS — R200 Anesthesia of skin: Secondary | ICD-10-CM

## 2019-10-28 DIAGNOSIS — J4550 Severe persistent asthma, uncomplicated: Secondary | ICD-10-CM | POA: Diagnosis not present

## 2019-10-28 NOTE — Telephone Encounter (Signed)
I called patient & LM to please try to call us back before the end of the day bc I really wanted to speak with her.

## 2019-10-28 NOTE — Telephone Encounter (Signed)
I have also sent patient a lengthy mychart message hoping she will be able to respond faster.

## 2019-10-28 NOTE — Telephone Encounter (Signed)
Call patient  Heard back from Dr. Jaynee Eagles at [facility], and  has concerns in regards to her weakness, numbness in the right upper extremity.  She advised me to place an urgent referral to Dr. Felecia Shelling who was multiple sclerosis expert with [facility].    I placed urgent referral.  She also advised that we go ahead and order MRI of the brain and also of the cervical spine.  She said there is literature in regards to the Covid vaccine and neurologic complications as well.  How is patient feeling today?  Please CONFIRM NO METAL in body so she may have MRIs done.

## 2019-10-28 NOTE — Telephone Encounter (Signed)
Pt called back returning your call. I also asked pt to Mychart in responding too.

## 2019-10-29 ENCOUNTER — Other Ambulatory Visit: Payer: Self-pay

## 2019-10-29 ENCOUNTER — Ambulatory Visit
Admission: RE | Admit: 2019-10-29 | Discharge: 2019-10-29 | Disposition: A | Discharge status: Home / Self Care | Payer: 59 | Source: Ambulatory Visit | Attending: Family | Admitting: Family

## 2019-10-29 ENCOUNTER — Encounter: Payer: Self-pay | Admitting: Family

## 2019-10-29 DIAGNOSIS — M4802 Spinal stenosis, cervical region: Secondary | ICD-10-CM | POA: Diagnosis not present

## 2019-10-29 DIAGNOSIS — R200 Anesthesia of skin: Secondary | ICD-10-CM | POA: Insufficient documentation

## 2019-10-29 LAB — POCT I-STAT CREATININE: Creatinine, Ser: 0.6 mg/dL (ref 0.44–1.00)

## 2019-10-29 MED ORDER — GADOBUTROL 1 MMOL/ML IV SOLN
6.0000 mL | Freq: Once | INTRAVENOUS | Status: AC | PRN
  Administered 2019-10-29: 12:00:00 6 mL via INTRAVENOUS

## 2019-10-29 NOTE — Telephone Encounter (Signed)
I have spoken to patient. See mychart message response.

## 2019-10-30 DIAGNOSIS — Q142 Congenital malformation of optic disc: Secondary | ICD-10-CM | POA: Diagnosis not present

## 2019-10-31 ENCOUNTER — Encounter: Payer: Self-pay | Admitting: Family

## 2019-11-03 ENCOUNTER — Encounter: Payer: Self-pay | Admitting: Neurology

## 2019-11-03 ENCOUNTER — Ambulatory Visit: Payer: 59 | Admitting: Neurology

## 2019-11-03 ENCOUNTER — Other Ambulatory Visit: Payer: Self-pay

## 2019-11-03 VITALS — BP 155/84 | HR 80 | Temp 97.6°F | Ht 62.0 in | Wt 158.5 lb

## 2019-11-03 DIAGNOSIS — M542 Cervicalgia: Secondary | ICD-10-CM

## 2019-11-03 DIAGNOSIS — H4710 Unspecified papilledema: Secondary | ICD-10-CM | POA: Diagnosis not present

## 2019-11-03 DIAGNOSIS — R5383 Other fatigue: Secondary | ICD-10-CM

## 2019-11-03 DIAGNOSIS — R519 Headache, unspecified: Secondary | ICD-10-CM

## 2019-11-03 DIAGNOSIS — R200 Anesthesia of skin: Secondary | ICD-10-CM | POA: Insufficient documentation

## 2019-11-03 DIAGNOSIS — E119 Type 2 diabetes mellitus without complications: Secondary | ICD-10-CM | POA: Diagnosis not present

## 2019-11-03 NOTE — Progress Notes (Signed)
[facility]  PATIENT: Bridget Peters DOB: [birth year]  REFERRING DOCTOR OR PCP: Mable Paris, FNP SOURCE: Patient, notes from primary care and ophthalmology, MRI and laboratory reports, MRI images personally reviewed.  _________________________________   HISTORICAL  CHIEF COMPLAINT:  Chief Complaint  Patient presents with  . New Patient (Initial Visit)    RM 13, alone. 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 She is also following with opthamologist for concern of multiple sclerosis She received covid-19 vaccine 10/21/19. Went to ED that night d/t SE. She received Pfizer vaccine.     HISTORY OF PRESENT ILLNESS:  I had the pleasure seeing your patient, Bridget Peters, at [facility] for neurologic consultation regarding her numbness, visual changes, headaches and other symptoms.  She is a 49 year old woman who reported multiple symptoms after her first Covid-19 vaccination 10/21/2019.    About one hour after the vaccination, she had tingling in the right hand/leg and face.   She also had a headache.  She went to the ED and was monitored x 4 hours and she was sen home.   The next day, she had more tingling and also had elbow pain.   She also felt balance was off and her headache worsened.  She had right visual blurry vision in September/October 2020 and saw Dr. Nancie Neas (optometrist).   She was concerned about her optic nerves and she was referred to Dr. Manuella Ghazi [facility]) in October 2020 who reportedly diagnosed optic neuritis.   She reports the last eye exam was unchanged.   She still feels the right vision is blurry and essentially unchanged.   She also has dry eyes.   She reports having visual fiend testing and being told it was fine.  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   She is often fatigued and this is worse with heat.   She has urinary urgency.   She occasionally has word finding difficulty and feels memory is reduced.   She also has had insomnia.    She has lost 20 pounds in the last 2 years on purpose.     I personally reviewed the images from the MRI Brain and cervical spine 10/29/19.  I concur with the official interpretation below. IMPRESSION: 1. No acute intracranial abnormality. 2. Small scattered foci of cerebral white matter T2 signal abnormality, mildly abnormal for age and nonspecific. Considerations include early chronic small vessel ischemia, sequelae of trauma, hypercoagulable state, vasculitis, migraines, prior infection or demyelination. 3. Partially empty sella.  IMPRESSION: 1. 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 2. Moderate to severe right facet arthrosis at C3-4 and C4-5 with mild right neural foraminal stenosis.  Laboratory test from 03/21/2019 showed normal vitamin D but elevated hemoglobin A1c of 7.2.  Glucose was 150 on the CMP and the ALT was elevated but the AST was normal.  REVIEW OF SYSTEMS: Constitutional: No fevers, chills, sweats, or change in appetite.  She has fatigue. Eyes: No visual changes, double vision, eye pain Ear, nose and throat: No hearing loss, ear pain, nasal congestion, sore throat Cardiovascular: No chest pain, palpitations Respiratory: No shortness of breath at rest or with exertion.   No wheezes GastrointestinaI: No nausea, vomiting, diarrhea, abdominal pain, fecal incontinence Genitourinary: No dysuria, urinary retention or frequency.  No nocturia. Musculoskeletal:As  above Integumentary: No rash, pruritus, skin lesions Neurological: as above Psychiatric: No depression at this time.  No anxiety Endocrine: No palpitations, diaphoresis, change in appetite, change in weigh or increased thirst.  She is on Invokana for diabetes.  She  has hypothyroidism. Hematologic/Lymphatic: No anemia, purpura, petechiae. Allergic/Immunologic: She has seasonal allergies.  ALLERGIES: Allergies  Allergen Reactions  . Covid-19 (Adenovirus) Vaccine     Pfzer  . Milk-Related Compounds Hives  . Shellfish Allergy Hives    Shortness of breath  . Tamiflu [Oseltamivir Phosphate]     hives  . Wheat Bran Hives  . Xolair [Omalizumab]     Itching  . Isovue [Iopamidol] Hives    Pt developed hives approximately 6 hours after receiving Isovue 300 for CT Scan. She was given 50mG  oral Benadryl and symptoms improved.     HOME MEDICATIONS:  Current Outpatient Medications:  .  albuterol (PROVENTIL) (2.5 MG/3ML) 0.083% nebulizer solution, Take 3 mLs (2.5 mg total) by nebulization every 6 (six) hours as needed for wheezing or shortness of breath., Disp: 75 mL, Rfl: 12 .  amLODipine (NORVASC) 2.5 MG tablet, Take 1 tablet (2.5 mg total) by mouth daily., Disp: 90 tablet, Rfl: 3 .  cetirizine (ZYRTEC ALLERGY) 10 MG tablet, Take 1 tablet (10 mg total) by mouth daily., Disp: 30 tablet, Rfl: 6 .  EPIPEN 2-PAK 0.3 MG/0.3ML SOAJ injection, , Disp: , Rfl: 11 .  fexofenadine (ALLEGRA) 180 MG tablet, Take 1 tablet (180 mg total) by mouth daily., Disp: 30 tablet, Rfl: 0 .  Fluticasone Propionate 93 MCG/ACT EXHU, Place 1 Act into the nose daily., Disp: , Rfl:  .  gabapentin (NEURONTIN) 100 MG capsule, Take 1 capsule (100 mg total) by mouth at bedtime., Disp: 90 capsule, Rfl: 1 .  INVOKANA 300 MG TABS tablet, TAKE 1 TABLET BY MOUTH DAILY., Disp: 90 tablet, Rfl: 3 .  levothyroxine (SYNTHROID) 75 MCG tablet, TAKE 1 TABLET BY MOUTH ONCE DAILY, Disp: 90 tablet, Rfl: 0 .  meloxicam (MOBIC) 7.5 MG tablet, Take 1 tablet (7.5 mg total) by mouth daily as needed for pain., Disp: 30 tablet, Rfl: 1 .  pantoprazole (PROTONIX) 40 MG tablet, Take 1 tablet (40 mg total) by mouth daily., Disp: 30 tablet, Rfl: 6 .  predniSONE (DELTASONE) 20 MG tablet, Take 2 tablets (40 mg total)  by mouth daily., Disp: 8 tablet, Rfl: 0 .  silver sulfADIAZINE (SILVADENE) 1 % cream, Apply 1 application topically daily., Disp: 50 g, Rfl: 0 .  Tiotropium Bromide Monohydrate 1.25 MCG/ACT AERS, Inhale 1.25 mcg into the lungs daily., Disp: , Rfl:   Current Facility-Administered Medications:  Marland Kitchen  Mepolizumab SOLR 100 mg, 100 mg, Subcutaneous, Q28 days, Mortimer Fries, Kurian, MD, 100 mg at 08/03/17 [id number]  PAST MEDICAL HISTORY: Past Medical History:  Diagnosis Date  . Allergy    Seasonal  . Asthma   . Depression   . Diabetes mellitus without complication ([place])    Pt takes Metformin.  Marland Kitchen Hypertension   . Pneumonia   . Shortness of breath   . Sleep apnea   . Thyroid disease     PAST SURGICAL HISTORY: Past Surgical History:  Procedure Laterality Date  . BREAST SURGERY  01/2013   Breast Reduction   . NASAL SINUS SURGERY    . NASAL SINUS SURGERY  02/2014  . REDUCTION MAMMAPLASTY Bilateral 2014  . WISDOM TOOTH EXTRACTION     x4    FAMILY HISTORY: Family History  Problem Relation Age of Onset  . Hypertension  Mother   . Endometrial cancer Mother 44  . Hypertension Father   . Colon polyps Father   . Cancer Maternal Aunt        Breast Cancer  . Breast cancer Maternal Aunt   . Breast cancer Paternal Aunt        Breast Cancer  . Cancer Maternal Grandmother 73       Bladder Cancer  . Stroke Maternal Grandmother   . Breast cancer Maternal Grandmother 60  . Cancer Maternal Grandfather 71       Esophagus Cancer  . Colon cancer Neg Hx   . Ovarian cancer Neg Hx     SOCIAL HISTORY:  Social History   Socioeconomic History  . Marital status: Married    Spouse name: Clare Gandy  . Number of children: Not on file  . Years of education: 76  . Highest education level: Not on file  Occupational History  . 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 Smoking status: Never Smoker  . Smokeless tobacco: Never Used  Substance and Sexual Activity  . Alcohol use: Yes    Alcohol/week: 0.0  standard drinks  . Drug use: No  . Sexual activity: Never    Partners: Male    Birth control/protection: Post-menopausal  Other Topics Concern  . Not on file  Social History Narrative   Married   Garment/textile technologist    Children- None, 2 step-sons    1 boston terriers    Caffeine- soda 2-3 cans, no coffee/tea   Moved 03/2015 from [place], [place]. [employer])   Right handed    Social Determinants of Health   Financial Resource Strain:   . Difficulty of Paying Living Expenses: Not on file  Food Insecurity:   . Worried About Charity fundraiser in the Last Year: Not on file  . Ran Out of Food in the Last Year: Not on file  Transportation Needs:   . Lack of Transportation (Medical): Not on file  . Lack of Transportation (Non-Medical): Not on file  Physical Activity:   . Days of Exercise per Week: Not on file  . Minutes of Exercise per Session: Not on file  Stress:   . Feeling of Stress : Not on file  Social Connections:   . Frequency of Communication with Friends and Family: Not on file  . Frequency of Social Gatherings with Friends and Family: Not on file  . Attends Religious Services: Not on file  . Active Member of Clubs or Organizations: Not on file  . Attends Archivist Meetings: Not on file  . Marital Status: Not on file  Intimate Partner Violence:   . Fear of Current or Ex-Partner: Not on file  . Emotionally Abused: Not on file  . Physically Abused: Not on file  . Sexually Abused: Not on file     PHYSICAL EXAM  Vitals:   11/03/19 1319  BP: (!) 155/84  Pulse: 80  Temp: 97.6 F (36.4 C)  Weight: 158 lb 8 oz (71.9 kg)  Height: 5\' 2"  (1.575 m)    Body mass index is 28.99 kg/m.   General: The patient is well-developed and well-nourished and in no acute distress  HEENT:  Head is [place]/AT.  Sclera are anicteric.  Funduscopic exam shows normal optic discs and retinal vessels.  Neck: No carotid bruits are noted.  The neck is tender  over the right occiput and less tender over some of the paraspinal muscles.  Cardiovascular:  The heart has a regular rate and rhythm with a normal S1 and S2. There were no murmurs, gallops or rubs.    Skin: Extremities are without rash or edema.  Musculoskeletal: Back is mildly tender.  Neurologic Exam  Mental status: The patient is alert and oriented x 3 at the time of the examination. The patient has apparent normal recent and remote memory, with an apparently normal attention span and concentration ability.   Speech is normal.  Cranial nerves: Extraocular movements are full. Pupils are equal, round, and reactive to light and accomodation.  Colors are brighter OS than OD.    Facial symmetry is present. There is good facial sensation to soft touch bilaterally.Facial strength is normal.  Trapezius and sternocleidomastoid strength is normal. No dysarthria is noted. No obvious hearing deficits are noted.  Motor:  Muscle bulk is normal.   Tone is normal. Strength is  5 / 5 in all 4 extremities.   Sensory: reports reduced sensation to touch and vibration on right arm relative left.  Symmetric temperature sensation in arms.    Coordination: Cerebellar testing reveals good finger-nose-finger and heel-to-shin bilaterally.  Gait and station: Station is normal.   Gait is normal. Tandem gait is mildly wide.  Romberg is negative.   Reflexes: Deep tendon reflexes are symmetric and normal bilaterally.   Plantar responses are flexor.     DIAGNOSTIC DATA (LABS, IMAGING, TESTING) - I reviewed patient records, labs, notes, testing and imaging myself where available.  Lab Results  Component Value Date   WBC 5.1 03/21/2019   HGB 14.1 03/21/2019   HCT 41.6 03/21/2019   MCV 86.1 03/21/2019   PLT 273.0 03/21/2019      Component Value Date/Time   NA 138 03/21/2019 1031   K 4.4 03/21/2019 1031   CL 101 03/21/2019 1031   CO2 27 03/21/2019 1031   GLUCOSE 150 (H) 03/21/2019 1031   BUN 18 03/21/2019  1031   CREATININE 0.60 10/29/2019 1044   CREATININE 0.90 05/14/2017 1220   CALCIUM 9.6 03/21/2019 1031   PROT 6.9 03/21/2019 1031   ALBUMIN 4.7 03/21/2019 1031   AST 27 03/21/2019 1031   ALT 49 (H) 03/21/2019 1031   ALKPHOS 94 03/21/2019 1031   BILITOT 0.4 03/21/2019 1031   GFRNONAA >60 06/06/2018 0721   GFRAA >60 06/06/2018 0721   Lab Results  Component Value Date   CHOL 330 (H) 03/21/2019   HDL 67.80 03/21/2019   LDLCALC 215 (H) 06/06/2018   LDLDIRECT 184.0 03/21/2019   TRIG 396.0 (H) 03/21/2019   CHOLHDL 5 03/21/2019   Lab Results  Component Value Date   HGBA1C 7.2 (H) 03/21/2019   No results found for: VITAMINB12 Lab Results  Component Value Date   TSH 1.40 03/21/2019       ASSESSMENT AND PLAN  Optic disc edema - Plan: DG FL GUIDED LUMBAR PUNCTURE  Nonintractable headache, unspecified chronicity pattern, unspecified headache type - Plan: DG FL GUIDED LUMBAR PUNCTURE  Numbness - Plan: DG FL GUIDED LUMBAR PUNCTURE  Neck pain  Controlled type 2 diabetes mellitus without complication, without long-term current use of insulin (HCC)  Fatigue, unspecified type  In summary, Ms. Bartunek is a 49 year old woman with headache and visual changes over the last half year who was found to have some disc abnormality in the past.  The brain MRI shows an empty sella and I am concerned that she could have idiopathic intracranial hypertension.  Additionally, she has had intermittent symptoms with numbness and fatigue  over the past 1 to 2 years and is concerned about the possibility of MS.  The brain MRI shows white matter changes more consistent with chronic microvascular ischemic change than with MS.  I will check a lumbar puncture which will allow [place] to determine if the intracranial pressure is elevated or if she has findings consistent with MS.  Based on the findings further evaluation or treatment may be necessary.  She will return to see me in about 6 to 8 weeks.  If  intracranial pressure is elevated I will start acetazolamide.  Additionally, to help with the headaches a right splenius capitis trigger point injection was performed with 80 mg Depo-Medrol in 4 cc of Marcaine.  Pain improved over the next few minutes.  Thank you for asking me to see Ms. Walls.  Please let me know if I can be of further assistance with her or other patients    A. Felecia Shelling, MD, [facility] [id number], [id number] PM Certified in Neurology, Clinical Neurophysiology, Sleep Medicine and Ne  In the future.[facility] [address] [place], [place] [address] [phone]

## 2019-11-07 ENCOUNTER — Ambulatory Visit: Payer: 59 | Admitting: Gastroenterology

## 2019-11-10 ENCOUNTER — Encounter: Payer: Self-pay | Admitting: Family

## 2019-11-10 ENCOUNTER — Other Ambulatory Visit: Payer: Self-pay

## 2019-11-10 ENCOUNTER — Telehealth: Payer: Self-pay | Admitting: *Deleted

## 2019-11-10 ENCOUNTER — Ambulatory Visit
Admission: RE | Admit: 2019-11-10 | Discharge: 2019-11-10 | Disposition: A | Discharge status: Home / Self Care | Payer: 59 | Source: Ambulatory Visit | Attending: Neurology | Admitting: Neurology

## 2019-11-10 ENCOUNTER — Other Ambulatory Visit: Payer: Self-pay | Admitting: Neurology

## 2019-11-10 DIAGNOSIS — H4710 Unspecified papilledema: Secondary | ICD-10-CM

## 2019-11-10 DIAGNOSIS — R200 Anesthesia of skin: Secondary | ICD-10-CM | POA: Diagnosis not present

## 2019-11-10 DIAGNOSIS — R519 Headache, unspecified: Secondary | ICD-10-CM

## 2019-11-10 DIAGNOSIS — H47399 Other disorders of optic disc, unspecified eye: Secondary | ICD-10-CM | POA: Diagnosis not present

## 2019-11-10 MED ORDER — ACETAZOLAMIDE 250 MG PO TABS: 250.0000 mg | ORAL_TABLET | Freq: Two times a day (BID) | ORAL | 5 refills | Status: DC

## 2019-11-10 NOTE — Discharge Instructions (Signed)

## 2019-11-10 NOTE — Telephone Encounter (Signed)
-----   Message from Britt Bottom, MD sent at 11/10/2019  3:52 PM EST ----- Please let her know, the lumbar puncture did show an elevated pressure.  Therefore, I am going to have her start acetazolamide (Diamox) 250 mg twice a day.  I sent it to the [facility] pharmacy

## 2019-11-10 NOTE — Telephone Encounter (Signed)
Called, LVM for pt about results (ok per DPR). Gave office number if she has further questions

## 2019-11-12 ENCOUNTER — Other Ambulatory Visit: Payer: Self-pay | Admitting: *Deleted

## 2019-11-12 MED ORDER — INDOMETHACIN 25 MG PO CAPS: ORAL_CAPSULE | ORAL | 0 refills | Status: DC

## 2019-11-15 LAB — CNS IGG SYNTHESIS RATE, CSF+BLOOD
Albumin Serum: 4.8 g/dL (ref 3.5–5.2)
Albumin, CSF: 15.4 mg/dL (ref 8.0–42.0)
CNS-IgG Synthesis Rate: -2.3 mg/24 h (ref <=3.3)
IgG (Immunoglobin G), Serum: 750 mg/dL (ref 600–1640)
IgG Total CSF: 1.2 mg/dL (ref 0.8–7.7)
IgG-Index: 0.5 (ref <0.66)

## 2019-11-15 LAB — CSF CELL COUNT WITH DIFFERENTIAL
RBC Count, CSF: 370 cells/uL — ABNORMAL HIGH
WBC, CSF: 2 cells/uL (ref 0–5)

## 2019-11-15 LAB — PROTEIN, CSF: Total Protein, CSF: 40 mg/dL (ref 15–45)

## 2019-11-15 LAB — GLUCOSE, CSF: Glucose, CSF: 79 mg/dL (ref 40–80)

## 2019-11-15 LAB — OLIGOCLONAL BANDS, CSF + SERM

## 2019-11-17 ENCOUNTER — Encounter: Payer: Self-pay | Admitting: *Deleted

## 2019-11-17 ENCOUNTER — Telehealth: Payer: Self-pay | Admitting: *Deleted

## 2019-11-17 NOTE — Telephone Encounter (Signed)
-----   Message from Britt Bottom, MD sent at 11/15/2019 11:39 AM EST ----- Please let the patient know that the rest of the spinal fluid results look good.  There was no evidence of MS.

## 2019-11-20 ENCOUNTER — Other Ambulatory Visit: Payer: Self-pay | Admitting: *Deleted

## 2019-11-20 MED ORDER — TOPIRAMATE 50 MG PO TABS: ORAL_TABLET | ORAL | 5 refills | Status: DC

## 2019-11-20 NOTE — Addendum Note (Signed)
Addended by: Hope Pigeon on: 11/20/2019 03:41 PM   Modules accepted: Orders

## 2019-11-25 DIAGNOSIS — J4550 Severe persistent asthma, uncomplicated: Secondary | ICD-10-CM | POA: Diagnosis not present

## 2019-12-09 ENCOUNTER — Ambulatory Visit (INDEPENDENT_AMBULATORY_CARE_PROVIDER_SITE_OTHER): Payer: 59 | Admitting: Neurology

## 2019-12-09 ENCOUNTER — Other Ambulatory Visit: Payer: Self-pay

## 2019-12-09 ENCOUNTER — Encounter: Payer: Self-pay | Admitting: Neurology

## 2019-12-09 VITALS — BP 179/90 | HR 89 | Temp 97.6°F | Ht 62.0 in | Wt 159.0 lb

## 2019-12-09 DIAGNOSIS — H4710 Unspecified papilledema: Secondary | ICD-10-CM | POA: Diagnosis not present

## 2019-12-09 DIAGNOSIS — G4489 Other headache syndrome: Secondary | ICD-10-CM | POA: Insufficient documentation

## 2019-12-09 DIAGNOSIS — R200 Anesthesia of skin: Secondary | ICD-10-CM | POA: Diagnosis not present

## 2019-12-09 MED ORDER — TOPIRAMATE 50 MG PO TABS: ORAL_TABLET | ORAL | 3 refills | Status: DC

## 2019-12-09 NOTE — Progress Notes (Signed)
[facility]  PATIENT: Bridget Peters DOB: 1971-10-03  REFERRING DOCTOR OR PCP: Mable Paris, FNP SOURCE: Patient, notes from primary care and ophthalmology, MRI and laboratory reports, MRI images personally reviewed.  _________________________________   HISTORICAL  CHIEF COMPLAINT:  Chief Complaint  Patient presents with  . Follow-up    RM 12, alone. Last seen 11/03/2019. She is feeling better since last visit. Having numb/ting in feet. Rash has resolved.     HISTORY OF PRESENT ILLNESS:  Bridget Peters is a 49 year old woman with numbness, visual changes, headaches and other symptoms  UPDATE 12/09/2019: She is feeling much better since the last visit.  At that time, she was experience continued headaches and eye pain and visual changes.  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  There were also some white matter changes, not typical for MS.  Therefore, lumbar puncture was performed.  The lumbar puncture showed mildly elevated OP = 250 mm.  The MS panel was normal/negative.  Initially actezolamide was started but she had a rash and it was d/c.    Topamax 50 mg was then started.    She feels headaches improved a couple weeks after starting topiramate.    She is tolerating it well.   She has noted mild tingling in her toes and altered taste (esp with carbonated beverages).  Visual changes and eye pain also improved after topiramate was started.     Symptoms after her first COVID-19 vaccination  After a Covid-19 vaccination (tingling, headache).       FROM 11/03/2019: She is a 49 year old woman who reported multiple symptoms after her first Covid-19 vaccination 10/21/2019.    About one hour after the vaccination, she had tingling in the right hand/leg and face.   She also had a headache.  She went to the ED and was monitored x 4 hours and she was sen home.   The next day, she had more tingling and also  had elbow pain.   She also felt balance was off and her headache worsened.  She had right visual blurry vision in September/October 2020 and saw Dr. Nancie Neas (optometrist).   She was concerned about her optic nerves and she was referred to Dr. Manuella Ghazi [facility]) in October 2020 who reportedly diagnosed optic neuritis.   She reports the last eye exam was unchanged.   She still feels the right vision is blurry and essentially unchanged.   She also has dry eyes.   She reports having visual fiend testing and being told it was fine.  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   She is often fatigued and this is worse with heat.   She has urinary urgency.   She occasionally has word finding difficulty and feels memory is reduced.   She also has had insomnia.    She has lost 20 pounds in the last 2 years on purpose.     I personally reviewed the images from the MRI Brain and cervical spine 10/29/19.  I concur with the official interpretation below. IMPRESSION: 1. No acute intracranial abnormality. 2. Small scattered foci of cerebral white matter T2 signal abnormality, mildly abnormal for age and nonspecific. Considerations include early chronic small vessel ischemia, sequelae of trauma, hypercoagulable state, vasculitis, migraines, prior infection or demyelination. 3. Partially empty sella.  IMPRESSION: 1. 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 2. Moderate to severe right facet arthrosis at C3-4 and C4-5 with mild right neural foraminal stenosis.  Laboratory test from 03/21/2019 showed normal vitamin D but elevated hemoglobin A1c of 7.2.  Glucose was 150 on the CMP and the ALT was elevated but the AST was normal.  REVIEW OF SYSTEMS: Constitutional: No fevers, chills, sweats, or change in appetite.  She has fatigue. Eyes: No visual changes, double vision, eye pain  Ear, nose and throat: No hearing loss, ear pain, nasal congestion, sore throat Cardiovascular: No chest pain, palpitations Respiratory: No shortness of breath at rest or with exertion.   No wheezes GastrointestinaI: No nausea, vomiting, diarrhea, abdominal pain, fecal incontinence Genitourinary: No dysuria, urinary retention or frequency.  No nocturia. Musculoskeletal:As above Integumentary: No rash, pruritus, skin lesions Neurological: as above Psychiatric: No depression at this time.  No anxiety Endocrine: No palpitations, diaphoresis, change in appetite, change in weigh or increased thirst.  She is on Invokana for diabetes.  She has hypothyroidism. Hematologic/Lymphatic: No anemia, purpura, petechiae. Allergic/Immunologic: She has seasonal allergies.  ALLERGIES: Allergies  Allergen Reactions  . Shellfish Allergy Hives and Shortness Of Breath  . Iodinated Diagnostic Agents Hives    Pt developed hives approximately 6 hours after receiving Isovue 300 for CT Scan. She was given 50mG  oral Benadryl and symptoms improved  . Milk-Related Compounds Hives  . Tamiflu [Oseltamivir Phosphate] Hives  . Wheat Bran Hives  . 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 Acetazolamide Rash  . Xolair [Omalizumab] Itching    HOME MEDICATIONS:  Current Outpatient Medications:  .  albuterol (PROVENTIL) (2.5 MG/3ML) 0.083% nebulizer solution, Take 3 mLs (2.5 mg total) by nebulization every 6 (six) hours as needed for wheezing or shortness of breath., Disp: 75 mL, Rfl: 12 .  amLODipine (NORVASC) 2.5 MG tablet, Take 1 tablet (2.5 mg total) by mouth daily., Disp: 90 tablet, Rfl: 3 .  cetirizine (ZYRTEC ALLERGY) 10 MG tablet, Take 1 tablet (10 mg total) by mouth daily., Disp: 30 tablet, Rfl: 6 .  EPIPEN 2-PAK 0.3 MG/0.3ML SOAJ injection, , Disp: , Rfl: 11 .  fexofenadine (ALLEGRA) 180 MG tablet, Take 1 tablet (180 mg total) by mouth daily., Disp: 30 tablet, Rfl: 0 .  Fluticasone Propionate 93 MCG/ACT  EXHU, Place 1 Act into the nose daily., Disp: , Rfl:  .  gabapentin (NEURONTIN) 100 MG capsule, Take 1 capsule (100 mg total) by mouth at bedtime., Disp: 90 capsule, Rfl: 1 .  indomethacin (INDOCIN) 25 MG capsule, Take 1 capsule by mouth up to three times daily as needed, Disp: 15 capsule, Rfl: 0 .  INVOKANA 300 MG TABS tablet, TAKE 1 TABLET BY MOUTH DAILY., Disp: 90 tablet, Rfl: 3 .  levothyroxine (SYNTHROID) 75 MCG tablet, TAKE 1 TABLET BY MOUTH ONCE DAILY, Disp: 90 tablet, Rfl: 0 .  meloxicam (MOBIC) 7.5 MG tablet, Take 1 tablet (7.5 mg total) by mouth daily as needed for pain., Disp: 30 tablet, Rfl: 1 .  pantoprazole (PROTONIX) 40 MG tablet, Take 1 tablet (40 mg total) by mouth daily., Disp: 30 tablet, Rfl: 6 .  predniSONE (DELTASONE) 20 MG tablet, Take 2 tablets (40 mg total) by mouth daily., Disp: 8 tablet, Rfl: 0 .  silver sulfADIAZINE (SILVADENE) 1 % cream, Apply 1 application topically daily., Disp: 50 g, Rfl: 0 .  Tiotropium Bromide Monohydrate 1.25 MCG/ACT AERS, Inhale 1.25 mcg into the lungs daily., Disp: , Rfl:  .  topiramate (TOPAMAX) 50  MG tablet, Take 1 tablet twice daily, Disp: 180 tablet, Rfl: 3  Current Facility-Administered Medications:  Marland Kitchen  Mepolizumab SOLR 100 mg, 100 mg, Subcutaneous, Q28 days, Mortimer Fries, Kurian, MD, 100 mg at 08/03/17 [id number]  PAST MEDICAL HISTORY: Past Medical History:  Diagnosis Date  . Allergy    Seasonal  . Asthma   . Depression   . Diabetes mellitus without complication ([place])    Pt takes Metformin.  Marland Kitchen Hypertension   . Pneumonia   . Shortness of breath   . Sleep apnea   . Thyroid disease     PAST SURGICAL HISTORY: Past Surgical History:  Procedure Laterality Date  . BREAST SURGERY  01/2013   Breast Reduction   . NASAL SINUS SURGERY    . NASAL SINUS SURGERY  02/2014  . REDUCTION MAMMAPLASTY Bilateral 2014  . WISDOM TOOTH EXTRACTION     x4    FAMILY HISTORY: Family History  Problem Relation Age of Onset  . Hypertension Mother   .  Endometrial cancer Mother 41  . Hypertension Father   . Colon polyps Father   . Cancer Maternal Aunt        Breast Cancer  . Breast cancer Maternal Aunt   . Breast cancer Paternal Aunt        Breast Cancer  . Cancer Maternal Grandmother 85       Bladder Cancer  . Stroke Maternal Grandmother   . Breast cancer Maternal Grandmother 60  . Cancer Maternal Grandfather 26       Esophagus Cancer  . Colon cancer Neg Hx   . Ovarian cancer Neg Hx     SOCIAL HISTORY:  Social History   Socioeconomic History  . Marital status: Married    Spouse name: Clare Gandy  . Number of children: Not on file  . Years of education: 43  . Highest education level: Not on file  Occupational History  . 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 Smoking status: Never Smoker  . Smokeless tobacco: Never Used  Substance and Sexual Activity  . Alcohol use: Yes    Alcohol/week: 0.0 standard drinks  . Drug use: No  . Sexual activity: Never    Partners: Male    Birth control/protection: Post-menopausal  Other Topics Concern  . Not on file  Social History Narrative   Married   Garment/textile technologist    Children- None, 2 step-sons    1 [place] terriers    Caffeine- soda 2-3 cans, no coffee/tea   Moved 03/2015 from [place], [place]. [employer])   Right handed    Social Determinants of Health   Financial Resource Strain:   . Difficulty of Paying Living Expenses: Not on file  Food Insecurity:   . Worried About Charity fundraiser in the Last Year: Not on file  . Ran Out of Food in the Last Year: Not on file  Transportation Needs:   . Lack of Transportation (Medical): Not on file  . Lack of Transportation (Non-Medical): Not on file  Physical Activity:   . Days of Exercise per Week: Not on file  . Minutes of Exercise per Session: Not on file  Stress:   . Feeling of Stress : Not on file  Social Connections:   . Frequency of Communication with Friends and Family: Not on file  .  Frequency of Social Gatherings with Friends and Family: Not on file  . Attends Religious Services: Not on file  . Active Member of  Clubs or Organizations: Not on file  . Attends Archivist Meetings: Not on file  . Marital Status: Not on file  Intimate Partner Violence:   . Fear of Current or Ex-Partner: Not on file  . Emotionally Abused: Not on file  . Physically Abused: Not on file  . Sexually Abused: Not on file     PHYSICAL EXAM  Vitals:   12/09/19 1506  BP: (!) 179/90  Pulse: 89  Temp: 97.6 F (36.4 C)  Weight: 159 lb (72.1 kg)  Height: 5\' 2"  (1.575 m)    Body mass index is 29.08 kg/m.   General: The patient is well-developed and well-nourished and in no acute distress  HEENT:  Head is [place]/AT.  Sclera are anicteric.  Funduscopic exam shows normal optic discs and retinal vessels.  Neck: No carotid bruits are noted.  The neck was nontender today.  Range of motion was good.  Skin: Extremities are without rash or edema.   Neurologic Exam  Mental status: The patient is alert and oriented x 3 at the time of the examination. The patient has apparent normal recent and remote memory, with an apparently normal attention span and concentration ability.   Speech is normal.  Cranial nerves: Extraocular movements are full. Pupils are equal, round, and reactive to light and accomodation.There is good facial sensation to soft touch bilaterally. Facial strength is normal.  Trapezius and sternocleidomastoid strength is normal. No dysarthria is noted. No obvious hearing deficits are noted.  Motor:  Muscle bulk is normal.   Tone is normal. Strength is  5 / 5 in all 4 extremities.   Sensory: Touch sensation is normal..    Coordination: Cerebellar testing reveals good finger-nose-finger and heel-to-shin bilaterally.  Gait and station: Station is normal.   Gait is normal. Tandem gait is normal.  Romberg is negative.   Reflexes: Deep tendon reflexes are symmetric and normal  bilaterally.        DIAGNOSTIC DATA (LABS, IMAGING, TESTING) - I reviewed patient records, labs, notes, testing and imaging myself where available.  Lab Results  Component Value Date   WBC 5.1 03/21/2019   HGB 14.1 03/21/2019   HCT 41.6 03/21/2019   MCV 86.1 03/21/2019   PLT 273.0 03/21/2019      Component Value Date/Time   NA 138 03/21/2019 1031   K 4.4 03/21/2019 1031   CL 101 03/21/2019 1031   CO2 27 03/21/2019 1031   GLUCOSE 150 (H) 03/21/2019 1031   BUN 18 03/21/2019 1031   CREATININE 0.60 10/29/2019 1044   CREATININE 0.90 05/14/2017 1220   CALCIUM 9.6 03/21/2019 1031   PROT 6.9 03/21/2019 1031   ALBUMIN 4.8 11/10/2019 1324   AST 27 03/21/2019 1031   ALT 49 (H) 03/21/2019 1031   ALKPHOS 94 03/21/2019 1031   BILITOT 0.4 03/21/2019 1031   GFRNONAA >60 06/06/2018 0721   GFRAA >60 06/06/2018 0721   Lab Results  Component Value Date   CHOL 330 (H) 03/21/2019   HDL 67.80 03/21/2019   LDLCALC 215 (H) 06/06/2018   LDLDIRECT 184.0 03/21/2019   TRIG 396.0 (H) 03/21/2019   CHOLHDL 5 03/21/2019   Lab Results  Component Value Date   HGBA1C 7.2 (H) 03/21/2019   No results found for: [id number] Lab Results  Component Value Date   TSH 1.40 03/21/2019       ASSESSMENT AND PLAN  Optic disc edema  Numbness  Other headache syndrome   1.   Symptoms are improved.  CSF  did not show changes worrisome for MS.  Opening pressure was borderline elevated and she could have mild idiopathic intracranial hypertension.. 2.   Continue Topamax. 3.   She will return in 1 year or sooner if there are new or worsening neurologic symptoms.   Anaika Santillano A. Felecia Shelling, MD, [facility] [id number], [id number] PM Certified in Neurology, Clinical Neurophysiology, Sleep Medicine and Ne  In the future.[facility] [address] [place], [place] [address] [phone]

## 2019-12-23 DIAGNOSIS — J4550 Severe persistent asthma, uncomplicated: Secondary | ICD-10-CM | POA: Diagnosis not present

## 2020-01-09 ENCOUNTER — Other Ambulatory Visit: Payer: Self-pay | Admitting: Family

## 2020-01-09 DIAGNOSIS — E119 Type 2 diabetes mellitus without complications: Secondary | ICD-10-CM

## 2020-01-12 ENCOUNTER — Telehealth: Payer: Self-pay | Admitting: Family

## 2020-01-13 NOTE — Telephone Encounter (Signed)
close

## 2020-01-20 DIAGNOSIS — J4550 Severe persistent asthma, uncomplicated: Secondary | ICD-10-CM | POA: Diagnosis not present

## 2020-01-27 ENCOUNTER — Encounter: Payer: Self-pay | Admitting: Neurology

## 2020-01-27 ENCOUNTER — Ambulatory Visit: Payer: 59 | Admitting: Neurology

## 2020-01-27 ENCOUNTER — Other Ambulatory Visit: Payer: Self-pay

## 2020-01-27 ENCOUNTER — Other Ambulatory Visit: Payer: Self-pay | Admitting: Neurology

## 2020-01-27 VITALS — BP 172/100 | HR 81 | Temp 97.5°F | Ht 62.0 in | Wt 160.0 lb

## 2020-01-27 DIAGNOSIS — R4789 Other speech disturbances: Secondary | ICD-10-CM

## 2020-01-27 DIAGNOSIS — R200 Anesthesia of skin: Secondary | ICD-10-CM | POA: Diagnosis not present

## 2020-01-27 DIAGNOSIS — H4710 Unspecified papilledema: Secondary | ICD-10-CM | POA: Diagnosis not present

## 2020-01-27 DIAGNOSIS — G4489 Other headache syndrome: Secondary | ICD-10-CM

## 2020-01-27 MED ORDER — TOPIRAMATE 100 MG PO TABS: ORAL_TABLET | ORAL | 3 refills | Status: DC

## 2020-01-27 NOTE — Progress Notes (Signed)
[facility]  PATIENT: Bridget Peters DOB: 09/18/1971  REFERRING DOCTOR OR PCP: Mable Paris, FNP SOURCE: Patient, notes from primary care and ophthalmology, MRI and laboratory reports, MRI images personally reviewed.  _________________________________   HISTORICAL  CHIEF COMPLAINT:  Chief Complaint  Patient presents with  . Follow-up    RM 12, alone. Last seen 12/09/2019. Here d/t tingling in legs/feet for the last week. Also having blurry vision in both eyes, intermittent (more during morning and nighttime). .Left arm/leg painful as well. Unable to think of her words.    HISTORY OF PRESENT ILLNESS:  [name] is a 49 year old woman with numbness, visual changes, headaches and other symptoms  Update 01/27/2020: At the last visit, we started Topiramate 50 mg po bid.  She has tingling from the knees down and in the right hand/arm.  The left arm feels heavy but is not tingling.     She has noted issues with word finding over the past 2 weeks.    Headaches were doing better initially after starting topiramate (and also after the LP) but not more recently.     LP showed OP=250 11/10/2019 and she felt better after the LP.    She feels current symptoms are similar to how she felt prior to the LP.   At that time, she also noted a mental fog with some word finding errors.  UPDATE 12/09/2019: She is feeling much better since the last visit.  At that time, she was experience continued headaches and eye pain and visual changes.  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  There were also some white matter changes, not typical for MS.  Therefore, lumbar puncture was performed.  The lumbar puncture showed mildly elevated OP = 250 mm.  The MS panel was normal/negative.  Initially actezolamide was started but she had a rash and it was d/c.    Topamax 50 mg was then started.    She feels headaches improved a  couple weeks after starting topiramate.    She is tolerating it well.   She has noted mild tingling in her toes and altered taste (esp with carbonated beverages).  Visual changes and eye pain also improved after topiramate was started.     Symptoms after her first COVID-19 vaccination  After a Covid-19 vaccination (tingling, headache).       FROM 11/03/2019: She is a 49 year old woman who reported multiple symptoms after her first Covid-19 vaccination 10/21/2019.    About one hour after the vaccination, she had tingling in the right hand/leg and face.   She also had a headache.  She went to the ED and was monitored x 4 hours and she was sen home.   The next day, she had more tingling and also had elbow pain.   She also felt balance was off and her headache worsened.  She had right visual blurry vision in September/October 2020 and saw Dr. Nancie Neas (optometrist).   She was concerned about her optic nerves and she was referred to Dr. Manuella Ghazi [facility]) in October 2020 who reportedly diagnosed optic neuritis.   She reports the last eye exam was unchanged.   She still feels the right vision is blurry and essentially unchanged.   She also has dry eyes.   She reports having visual fiend testing and being told it was fine.  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   She is often fatigued and this is worse with heat.   She has urinary urgency.   She occasionally has word finding difficulty and feels memory is reduced.   She also has had insomnia.    She has lost 20 pounds in the last 2 years on purpose.     I personally reviewed the images from the MRI Brain and cervical spine 10/29/19.  I concur with the official interpretation below. IMPRESSION: 1. No acute intracranial abnormality. 2. Small scattered foci of cerebral white matter T2 signal abnormality, mildly abnormal for age and nonspecific.  Considerations include early chronic small vessel ischemia, sequelae of trauma, hypercoagulable state, vasculitis, migraines, prior infection or demyelination. 3. Partially empty sella.  IMPRESSION: 1. 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 2. Moderate to severe right facet arthrosis at C3-4 and C4-5 with mild right neural foraminal stenosis.  Laboratory test from 03/21/2019 showed normal vitamin D but elevated hemoglobin A1c of 7.2.  Glucose was 150 on the CMP and the ALT was elevated but the AST was normal.  REVIEW OF SYSTEMS: Constitutional: No fevers, chills, sweats, or change in appetite.  She has fatigue. Eyes: No visual changes, double vision, eye pain Ear, nose and throat: No hearing loss, ear pain, nasal congestion, sore throat Cardiovascular: No chest pain, palpitations Respiratory: No shortness of breath at rest or with exertion.   No wheezes GastrointestinaI: No nausea, vomiting, diarrhea, abdominal pain, fecal incontinence Genitourinary: No dysuria, urinary retention or frequency.  No nocturia. Musculoskeletal:As above Integumentary: No rash, pruritus, skin lesions Neurological: as above Psychiatric: No depression at this time.  No anxiety Endocrine: No palpitations, diaphoresis, change in appetite, change in weigh or increased thirst.  She is on Invokana for diabetes.  She has hypothyroidism. Hematologic/Lymphatic: No anemia, purpura, petechiae. Allergic/Immunologic: She has seasonal allergies.  ALLERGIES: Allergies  Allergen Reactions  . Shellfish Allergy Hives and Shortness Of Breath  . Iodinated Diagnostic Agents Hives    Pt developed hives approximately 6 hours after receiving Isovue 300 for CT Scan. She was given 50mG  oral Benadryl and symptoms improved  . Milk-Related Compounds Hives  . Tamiflu [Oseltamivir Phosphate] Hives  . Wheat Bran Hives  . 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 Acetazolamide Rash  .  Xolair [Omalizumab] Itching    HOME MEDICATIONS:  Current Outpatient Medications:  .  albuterol (PROVENTIL) (2.5 MG/3ML) 0.083% nebulizer solution, Take 3 mLs (2.5 mg total) by nebulization every 6 (six) hours as needed for wheezing or shortness of breath., Disp: 75 mL, Rfl: 12 .  amLODipine (NORVASC) 2.5 MG tablet, Take 1 tablet (2.5 mg total) by mouth daily., Disp: 90 tablet, Rfl: 3 .  cetirizine (ZYRTEC ALLERGY) 10 MG tablet, Take 1 tablet (10 mg total) by mouth daily., Disp: 30 tablet, Rfl: 6 .  EPIPEN 2-PAK 0.3 MG/0.3ML SOAJ injection, , Disp: , Rfl: 11 .  fexofenadine (ALLEGRA) 180 MG tablet, Take 1 tablet (180 mg total) by mouth daily., Disp: 30 tablet, Rfl: 0 .  Fluticasone Propionate 93 MCG/ACT EXHU, Place 1 Act into the nose daily., Disp: , Rfl:  .  gabapentin (NEURONTIN) 100 MG capsule, Take 1 capsule (100 mg total) by mouth at bedtime., Disp: 90 capsule, Rfl: 1 .  indomethacin (INDOCIN) 25 MG capsule, Take 1 capsule by mouth up to three times daily as needed, Disp: 15 capsule, Rfl: 0 .  INVOKANA 300 MG TABS tablet,  TAKE 1 TABLET BY MOUTH DAILY., Disp: 90 tablet, Rfl: 3 .  levothyroxine (SYNTHROID) 75 MCG tablet, TAKE 1 TABLET BY MOUTH ONCE DAILY, Disp: 90 tablet, Rfl: 0 .  meloxicam (MOBIC) 7.5 MG tablet, Take 1 tablet (7.5 mg total) by mouth daily as needed for pain., Disp: 30 tablet, Rfl: 1 .  pantoprazole (PROTONIX) 40 MG tablet, Take 1 tablet (40 mg total) by mouth daily., Disp: 30 tablet, Rfl: 6 .  predniSONE (DELTASONE) 20 MG tablet, Take 2 tablets (40 mg total) by mouth daily., Disp: 8 tablet, Rfl: 0 .  silver sulfADIAZINE (SILVADENE) 1 % cream, Apply 1 application topically daily., Disp: 50 g, Rfl: 0 .  Tiotropium Bromide Monohydrate 1.25 MCG/ACT AERS, Inhale 1.25 mcg into the lungs daily., Disp: , Rfl:  .  topiramate (TOPAMAX) 100 MG tablet, Take 1 tablet twice daily, Disp: 180 tablet, Rfl: 3  Current Facility-Administered Medications:  Marland Kitchen  Mepolizumab SOLR 100 mg, 100 mg,  Subcutaneous, Q28 days, Mortimer Fries, Kurian, MD, 100 mg at 08/03/17 [id number]  PAST MEDICAL HISTORY: Past Medical History:  Diagnosis Date  . Allergy    Seasonal  . Asthma   . Depression   . Diabetes mellitus without complication ([place])    Pt takes Metformin.  Marland Kitchen Hypertension   . Pneumonia   . Shortness of breath   . Sleep apnea   . Thyroid disease     PAST SURGICAL HISTORY: Past Surgical History:  Procedure Laterality Date  . BREAST SURGERY  01/2013   Breast Reduction   . NASAL SINUS SURGERY    . NASAL SINUS SURGERY  02/2014  . REDUCTION MAMMAPLASTY Bilateral 2014  . WISDOM TOOTH EXTRACTION     x4    FAMILY HISTORY: Family History  Problem Relation Age of Onset  . Hypertension Mother   . Endometrial cancer Mother 83  . Hypertension Father   . Colon polyps Father   . Cancer Maternal Aunt        Breast Cancer  . Breast cancer Maternal Aunt   . Breast cancer Paternal Aunt        Breast Cancer  . Cancer Maternal Grandmother 17       Bladder Cancer  . Stroke Maternal Grandmother   . Breast cancer Maternal Grandmother 60  . Cancer Maternal Grandfather 29       Esophagus Cancer  . Colon cancer Neg Hx   . Ovarian cancer Neg Hx     SOCIAL HISTORY:  Social History   Socioeconomic History  . Marital status: Married    Spouse name: Clare Gandy  . Number of children: Not on file  . Years of education: 4  . Highest education level: Not on file  Occupational History  . 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 Smoking status: Never Smoker  . Smokeless tobacco: Never Used  Substance and Sexual Activity  . Alcohol use: Yes    Alcohol/week: 0.0 standard drinks  . Drug use: No  . Sexual activity: Never    Partners: Male    Birth control/protection: Post-menopausal  Other Topics Concern  . Not on file  Social History Narrative   Married   Garment/textile technologist    Children- None, 2 step-sons    1 boston terriers    Caffeine- soda 2-3 cans, no  coffee/tea   Moved 03/2015 from [place], [place]. [employer])   Right handed    Social Determinants of Health   Financial Resource Strain:   . Difficulty  of Paying Living Expenses:   Food Insecurity:   . Worried About Charity fundraiser in the Last Year:   . Arboriculturist in the Last Year:   Transportation Needs:   . Film/video editor (Medical):   Marland Kitchen Lack of Transportation (Non-Medical):   Physical Activity:   . Days of Exercise per Week:   . Minutes of Exercise per Session:   Stress:   . Feeling of Stress :   Social Connections:   . Frequency of Communication with Friends and Family:   . Frequency of Social Gatherings with Friends and Family:   . Attends Religious Services:   . Active Member of Clubs or Organizations:   . Attends Archivist Meetings:   Marland Kitchen Marital Status:   Intimate Partner Violence:   . Fear of Current or Ex-Partner:   . Emotionally Abused:   Marland Kitchen Physically Abused:   . Sexually Abused:      PHYSICAL EXAM  Vitals:   01/27/20 1047  BP: (!) 172/100  Pulse: 81  Temp: (!) 97.5 F (36.4 C)  Weight: 160 lb (72.6 kg)  Height: 5\' 2"  (1.575 m)    Body mass index is 29.26 kg/m.   General: The patient is well-developed and well-nourished and in no acute distress  HEENT:  Head is [place]/AT.  Sclera are anicteric.  I cannot appreciate any optic nerve edema on funduscopic examination today.  Retinal vessels appeared normal.  Neck: No carotid bruits are noted.  The neck was nontender today.  Range of motion was good.  Skin: Extremities are without rash or edema.   Neurologic Exam  Mental status: The patient is alert and oriented x 3 at the time of the examination. The patient has apparent normal recent and remote memory, with an apparently normal attention span and concentration ability.   Speech is normal.  Cranial nerves: Extraocular movements are full. Pupils are equal, round, and reactive to light and accomodation.There is good facial  sensation to soft touch bilaterally. Facial strength is normal.  Trapezius and sternocleidomastoid strength is normal. No dysarthria is noted. No obvious hearing deficits are noted.  Motor:  Muscle bulk is normal.   Tone is normal. Strength is  5 / 5 in all 4 extremities.   Sensory: Touch sensation is normal..    Coordination: Cerebellar testing reveals good finger-nose-finger and heel-to-shin bilaterally.  Gait and station: Station is normal.   Gait is normal. Tandem gait is normal.  Romberg is negative.   Reflexes: Deep tendon reflexes are symmetric and normal bilaterally.        DIAGNOSTIC DATA (LABS, IMAGING, TESTING) - I reviewed patient records, labs, notes, testing and imaging myself where available.  Lab Results  Component Value Date   WBC 5.1 03/21/2019   HGB 14.1 03/21/2019   HCT 41.6 03/21/2019   MCV 86.1 03/21/2019   PLT 273.0 03/21/2019      Component Value Date/Time   NA 138 03/21/2019 1031   K 4.4 03/21/2019 1031   CL 101 03/21/2019 1031   CO2 27 03/21/2019 1031   GLUCOSE 150 (H) 03/21/2019 1031   BUN 18 03/21/2019 1031   CREATININE 0.60 10/29/2019 1044   CREATININE 0.90 05/14/2017 1220   CALCIUM 9.6 03/21/2019 1031   PROT 6.9 03/21/2019 1031   ALBUMIN 4.8 11/10/2019 1324   AST 27 03/21/2019 1031   ALT 49 (H) 03/21/2019 1031   ALKPHOS 94 03/21/2019 1031   BILITOT 0.4 03/21/2019 1031   GFRNONAA >  60 06/06/2018 0721   GFRAA >60 06/06/2018 0721   Lab Results  Component Value Date   CHOL 330 (H) 03/21/2019   HDL 67.80 03/21/2019   LDLCALC 215 (H) 06/06/2018   LDLDIRECT 184.0 03/21/2019   TRIG 396.0 (H) 03/21/2019   CHOLHDL 5 03/21/2019   Lab Results  Component Value Date   HGBA1C 7.2 (H) 03/21/2019   No results found for: [id number] Lab Results  Component Value Date   TSH 1.40 03/21/2019       ASSESSMENT AND PLAN  Optic disc edema  Numbness  Other headache syndrome  Word finding difficulty   1.    I will have her increase the  Topamax.  However, if she finds word finding worsens or if there is no benefit after another month or so, we will need to consider a repeat lumbar puncture to reassess the opening pressure and also hopefully help with the symptoms 2.   CSF did not show changes worrisome for MS.  Opening pressure was borderline elevated and she could have mild idiopathic intracranial hypertension.. 2.   Continue Topamax. 3.   She will return in 1 year or sooner if there are new or worsening neurologic symptoms.   Danen Lapaglia A. Felecia Shelling, MD, [facility] [id number], [id number] PM Certified in Neurology, Clinical Neurophysiology, Sleep Medicine and Ne  In the future.[facility] [address], [place], [place] [address] [phone]

## 2020-02-09 ENCOUNTER — Encounter: Payer: Self-pay | Admitting: Family

## 2020-02-16 ENCOUNTER — Other Ambulatory Visit: Payer: Self-pay | Admitting: Internal Medicine

## 2020-02-16 DIAGNOSIS — R05 Cough: Secondary | ICD-10-CM

## 2020-02-16 DIAGNOSIS — J0100 Acute maxillary sinusitis, unspecified: Secondary | ICD-10-CM

## 2020-02-16 DIAGNOSIS — J4520 Mild intermittent asthma, uncomplicated: Secondary | ICD-10-CM

## 2020-02-16 DIAGNOSIS — R059 Cough, unspecified: Secondary | ICD-10-CM

## 2020-02-17 DIAGNOSIS — J4550 Severe persistent asthma, uncomplicated: Secondary | ICD-10-CM | POA: Diagnosis not present

## 2020-02-23 ENCOUNTER — Other Ambulatory Visit: Payer: Self-pay | Admitting: Family

## 2020-02-23 DIAGNOSIS — E039 Hypothyroidism, unspecified: Secondary | ICD-10-CM

## 2020-02-26 ENCOUNTER — Other Ambulatory Visit: Payer: Self-pay | Admitting: Internal Medicine

## 2020-02-26 DIAGNOSIS — R059 Cough, unspecified: Secondary | ICD-10-CM

## 2020-03-01 ENCOUNTER — Other Ambulatory Visit: Payer: Self-pay | Admitting: Internal Medicine

## 2020-03-01 DIAGNOSIS — R059 Cough, unspecified: Secondary | ICD-10-CM

## 2020-03-14 ENCOUNTER — Telehealth: Payer: 59 | Admitting: Family

## 2020-03-14 DIAGNOSIS — L255 Unspecified contact dermatitis due to plants, except food: Secondary | ICD-10-CM

## 2020-03-14 MED ORDER — PREDNISONE 10 MG (21) PO TBPK: ORAL_TABLET | ORAL | 0 refills | Status: DC

## 2020-03-14 MED ORDER — DOXYCYCLINE HYCLATE 100 MG PO TABS: 100.0000 mg | ORAL_TABLET | Freq: Two times a day (BID) | ORAL | 0 refills | Status: DC

## 2020-03-14 NOTE — Progress Notes (Signed)
We are sorry that you are not feeing well.  Here is how we plan to help!  Based on what you have shared with me it looks like you have had an allergic reaction to the oily resin from a group of plants.  This resin is very sticky, so it easily attaches to your skin, clothing, tools equipment, and pet's fur.    This blistering rash is often called poison ivy rash although it can come from contact with the leaves, stems and roots of poison ivy, poison oak and poison sumac.  The oily resin contains urushiol (u-ROO-she-ol) that produces a skin rash on exposed skin.  The severity of the rash depends on the amount of urushiol that gets on your skin.  A section of skin with more urushiol on it may develop a rash sooner.  The rash usually develops 12-48 hours after exposure and can last two to three weeks.  Your skin must come in direct contact with the plant's oil to be affected.  Blister fluid doesn't spread the rash.  However, if you come into contact with a piece of clothing or pet fur that has urushiol on it, the rash may spread out.  You can also transfer the oil to other parts of your body with your fingers.  Often the rash looks like a straight line because of the way the plant brushes against your skin.    I have developed the following plan to treat your condition I suspect that a bacterial infection has developed at the rash site, and I have given you both an oral steroid and an oral antibiotic.  I have sent a prednisone dose pack to your chosen pharmacy. Be sure to follow the instructions carefully and complete the entire prescription.  You may use Benadryl or Caladryl topical lotions to sooth the itch and remember cool, not hot, showers and baths can help relieve the itching!  I have also sent in a prescription for an antibiotic: doxycycline 100 mg twice a day for 10 days.   Please schedule an appointment with your primary care provider to follow up on your skin infection within 2 weeks.  If drainage  and red streaking continue to spread you should be seen urgently.  Make sure that the clothes you were wearing and any towels or sheets that may have come in contact with the oil (urushiol) are washed in detergent and hot water.      What can you do to prevent this rash?  Avoid the plants.  Learn how to identify poison ivy, poison oak and poison sumac in all seasons.  When hiking or engaging in other activities that might expose you to these plants, try to stay on cleared pathways.  If camping, make sure you pitch your tent in an area free of these plants.  Keep pets from running through wooded areas so that urushiol doesn't accidentally stick to their fur, which you may touch.  Remove or kill the plants.  In your yard, you can get rid of poison ivy by applying an herbicide or pulling it out of the ground, including the roots, while wearing heavy gloves.  Afterward remove the gloves and thoroughly wash them and your hands.  Don't burn poison ivy or related plants because the urushiol can be carried by smoke.  Wear protective clothing.  If needed, protect your skin by wearing socks, boots, pants, long sleeves and vinyl gloves.  Wash your skin right away.  Washing off the oil with  soap and water within 30 minutes of exposure may reduce your chances of getting a poison ivy rash.  Even washing after an hour or so can help reduce the severity of the rash.  If you walk through some poison ivy and then later touch your shoes, you may get some urushiol on your hands, which may then transfer to your face or body by touching or rubbing.  If the contaminated object isn't cleaned, the urushiol on it can still cause a skin reaction years later.    Be careful not to reuse towels after you have washed your skin.  Also carefully wash clothing in detergent and hot water to remove all traces of the oil.  Handle contaminated clothing carefully so you don't transfer the urushiol to yourself, furniture, rugs or  appliances.  Remember that pets can carry the oil on their fur and paws.  If you think your pet may be contaminated with urushiol, put on some long rubber gloves and give your pet a bath.  Finally, be careful not to burn these plants as the smoke can contain traces of the oil.  Inhaling the smoke may result in difficulty breathing. If that occurred you should see a physician as soon as possible.  See your doctor right away if:   The reaction is severe or widespread  You inhaled the smoke from burning poison ivy and are having difficulty breathing  Your skin continues to swell  The rash affects your eyes, mouth or genitals  Blisters are oozing pus  You develop a fever greater than 100 F (37.8 C)  The rash doesn't get better within a few weeks.  If you scratch the poison ivy rash, bacteria under your fingernails may cause the skin to become infected.  See your doctor if pus starts oozing from the blisters.  Treatment generally includes antibiotics.  Poison ivy treatments are usually limited to self-care methods.  And the rash typically goes away on its own in two to three weeks.     If the rash is widespread or results in a large number of blisters, your doctor may prescribe an oral corticosteroid, such as prednisone.  If a bacterial infection has developed at the rash site, your doctor may give you a prescription for an oral antibiotic.  MAKE SURE YOU   Understand these instructions.  Will watch your condition.  Will get help right away if you are not doing well or get worse.  Thank you for choosing an e-visit. Your e-visit answers were reviewed by a board certified advanced clinical practitioner to complete your personal care plan. Depending upon the condition, your plan could have included both over the counter or prescription medications.  Please review your pharmacy choice. If there is a problem you may use MyChart messaging to have the prescription routed to another  pharmacy.   Your safety is important to [place]. If you have drug allergies check your prescription carefully.  You can use MyChart to ask questions about today's visit, request a non-urgent call back, or ask for a work or school excuse for 24 hours related to this e-Visit. If it has been greater than 24 hours you will need to follow up with your provider, or enter a new e-Visit to address those concerns.   You will get an email in the next two days asking about your experience. I hope that your e-visit has been valuable and will speed your recovery Thank you for choosing an e-visit.   Approximately 5  minutes was spent documenting and reviewing patient's chart.

## 2020-03-15 ENCOUNTER — Encounter: Payer: Self-pay | Admitting: Family

## 2020-03-15 NOTE — Telephone Encounter (Signed)
Called patient to discuss Patient advice request. Patient was clear of thought and had no verbal distress. Gave advice from Mable Paris to seek further care and evaluation at an Urgent care today.

## 2020-03-16 DIAGNOSIS — J4550 Severe persistent asthma, uncomplicated: Secondary | ICD-10-CM | POA: Diagnosis not present

## 2020-03-17 ENCOUNTER — Other Ambulatory Visit: Payer: Self-pay | Admitting: *Deleted

## 2020-03-17 DIAGNOSIS — R2689 Other abnormalities of gait and mobility: Secondary | ICD-10-CM

## 2020-03-17 DIAGNOSIS — R200 Anesthesia of skin: Secondary | ICD-10-CM

## 2020-03-17 DIAGNOSIS — H4710 Unspecified papilledema: Secondary | ICD-10-CM

## 2020-03-18 ENCOUNTER — Telehealth: Payer: Self-pay | Admitting: Neurology

## 2020-03-18 NOTE — Telephone Encounter (Signed)
Called  Patient and left her a message relaying that [facility] will call and schedule her apt for LP.

## 2020-03-24 ENCOUNTER — Encounter: Payer: Self-pay | Admitting: Family

## 2020-03-24 ENCOUNTER — Telehealth: Payer: 59 | Admitting: Family

## 2020-03-24 DIAGNOSIS — T148XXA Other injury of unspecified body region, initial encounter: Secondary | ICD-10-CM | POA: Diagnosis not present

## 2020-03-24 MED ORDER — AMOXICILLIN-POT CLAVULANATE 875-125 MG PO TABS: 1.0000 | ORAL_TABLET | Freq: Two times a day (BID) | ORAL | 0 refills | Status: DC

## 2020-03-24 NOTE — Progress Notes (Signed)
E visit for Simple Cut/Laceration  We are sorry that you have had an injury. Here is how we plan to help!  Based on what you shared with me it looks like you have a simple laceration that does not need to be repaired with stitches or tissue glue.  and I have prescribed Augmentin, 875 mg by mouth twice daily for 10 days.  HOME CARE: . Clean the cut or scrape - Wash it well with soap and water. * avoid using hydrogen peroxide which may cause tissue damage, or impede wound healing.  . Stop the bleeding - If your cut or scrape is bleeding, press a clean cloth or bandage firmly on the area for 20 minutes. You can also help slow the bleeding by holding the cut above the level of your heart.   . Put a thin layer of Bacitracin antibiotic ointment on the cut or scrape. (this can be purchased at any local pharmacy- ask your pharmacist if you need assistance)   . Cover the cut or scrape with a bandage or gauze. Keep the bandage clean and dry. Change the bandage 1 to 2 times every day until your cut or scrape heals.   . Watch for signs that your cut or scrape is infected (redness, drainage, pain, warmth, swelling or fever)  Over the next 48 hours your wound should start to improve with less pain, less swelling and less redness. If you should develop increasing pain, swelling, redness, fever, pus from the wound you should be seen immediately to make sure this is not becoming infected.   WOUND CARE: Please keep a layer of antibiotic ointment (bacitracin preferred) on this wound at least twice a day for the next seven days and keep a sterile dressing over top of it. You may gently clean the wound with warm soap and water between dressing changes.  We strongly recommend that you have a medical provider reevaluate your wound within 2 to 3 days in person to make sure that it is healing appropriately.  Thank you for choosing an e-visit. Your e-visit answers were reviewed by a board certified advanced clinical  practitioner to complete your personal care plan. Depending upon the condition, your plan could have included both over the counter or prescription medications. Please review your pharmacy choice. Make sure the pharmacy is open so you can pick up prescription now. If there is a problem, you may contact your provider through [employer] and have the prescription routed to another pharmacy. Your safety is important to [place]. If you have drug allergies check your prescription carefully.  For the next 24 hours you can use MyChart to ask questions about today's visit, request a non-urgent call back, or ask for a work or school excuse.  You will get an email with a link to our survey asking about your experience. I hope that your e-visit has been valuable and will speed your recovery     Approximately 5 minutes was spent documenting and reviewing patient's chart.

## 2020-03-25 ENCOUNTER — Other Ambulatory Visit: Payer: Self-pay

## 2020-03-25 ENCOUNTER — Ambulatory Visit
Admission: RE | Admit: 2020-03-25 | Discharge: 2020-03-25 | Disposition: A | Discharge status: Home / Self Care | Payer: 59 | Source: Ambulatory Visit | Attending: Neurology | Admitting: Neurology

## 2020-03-25 DIAGNOSIS — H4710 Unspecified papilledema: Secondary | ICD-10-CM | POA: Diagnosis not present

## 2020-03-25 DIAGNOSIS — R200 Anesthesia of skin: Secondary | ICD-10-CM | POA: Insufficient documentation

## 2020-03-25 DIAGNOSIS — H47399 Other disorders of optic disc, unspecified eye: Secondary | ICD-10-CM | POA: Diagnosis not present

## 2020-03-25 DIAGNOSIS — R2689 Other abnormalities of gait and mobility: Secondary | ICD-10-CM | POA: Insufficient documentation

## 2020-03-25 HISTORY — DX: Family history of other specified conditions: Z84.89

## 2020-03-25 HISTORY — DX: Other complications of anesthesia, initial encounter: T88.59XA

## 2020-03-25 LAB — CBC
HCT: 44.9 % (ref 36.0–46.0)
Hemoglobin: 15 g/dL (ref 12.0–15.0)
MCH: 29.4 pg (ref 26.0–34.0)
MCHC: 33.4 g/dL (ref 30.0–36.0)
MCV: 88 fL (ref 80.0–100.0)
Platelets: 230 10*3/uL (ref 150–400)
RBC: 5.1 MIL/uL (ref 3.87–5.11)
RDW: 13 % (ref 11.5–15.5)
WBC: 5.9 10*3/uL (ref 4.0–10.5)
nRBC: 0 % (ref 0.0–0.2)

## 2020-03-25 LAB — APTT: aPTT: 32 seconds (ref 24–36)

## 2020-03-25 LAB — PROTIME-INR
INR: 0.9 (ref 0.8–1.2)
Prothrombin Time: 11.7 seconds (ref 11.4–15.2)

## 2020-03-25 LAB — POCT PREGNANCY, URINE: Preg Test, Ur: NEGATIVE

## 2020-03-25 LAB — GLUCOSE, CAPILLARY: Glucose-Capillary: 102 mg/dL — ABNORMAL HIGH (ref 70–99)

## 2020-03-25 NOTE — Progress Notes (Signed)
Pt stable after l.p.Back stable.D/c instructions given.F/u with her M.D.

## 2020-03-25 NOTE — Discharge Instructions (Signed)
Lay flat as much as possible for the rest of the day today.

## 2020-03-25 NOTE — OR Nursing (Signed)
Patient reports being NPO except consuming 40 ounces of water by 7:30 am.

## 2020-03-25 NOTE — Telephone Encounter (Signed)
Left message to call back  

## 2020-03-26 NOTE — Telephone Encounter (Signed)
Left message to call back  

## 2020-04-13 ENCOUNTER — Other Ambulatory Visit: Payer: Self-pay | Admitting: Family

## 2020-04-13 DIAGNOSIS — J4550 Severe persistent asthma, uncomplicated: Secondary | ICD-10-CM | POA: Diagnosis not present

## 2020-04-29 ENCOUNTER — Other Ambulatory Visit: Payer: Self-pay | Admitting: Internal Medicine

## 2020-04-29 DIAGNOSIS — R059 Cough, unspecified: Secondary | ICD-10-CM

## 2020-04-30 ENCOUNTER — Encounter: Payer: Self-pay | Admitting: Family

## 2020-05-07 ENCOUNTER — Other Ambulatory Visit: Payer: Self-pay | Admitting: Internal Medicine

## 2020-05-07 ENCOUNTER — Telehealth: Payer: Self-pay | Admitting: Family

## 2020-05-07 DIAGNOSIS — R059 Cough, unspecified: Secondary | ICD-10-CM

## 2020-05-07 NOTE — Telephone Encounter (Signed)
Patient is Over due for A1c and has other quality Metrics due she has an upcoming appointment please close. Patient needs a1c , hep c screening, Tdap, Urine Microalbumin, Foot exam,  Vaccine status update.  Appt is 05/10/20 Bridget Peters

## 2020-05-10 ENCOUNTER — Ambulatory Visit: Payer: 59 | Admitting: Primary Care

## 2020-05-10 ENCOUNTER — Other Ambulatory Visit: Payer: Self-pay | Admitting: Family

## 2020-05-10 ENCOUNTER — Ambulatory Visit: Payer: 59 | Admitting: Family

## 2020-05-10 ENCOUNTER — Telehealth: Payer: Self-pay | Admitting: Family

## 2020-05-10 ENCOUNTER — Encounter: Payer: Self-pay | Admitting: Primary Care

## 2020-05-10 ENCOUNTER — Other Ambulatory Visit: Payer: Self-pay

## 2020-05-10 VITALS — BP 130/86 | HR 72 | Temp 97.0°F | Ht 62.01 in | Wt 150.0 lb

## 2020-05-10 DIAGNOSIS — M25552 Pain in left hip: Secondary | ICD-10-CM | POA: Diagnosis not present

## 2020-05-10 DIAGNOSIS — Z1231 Encounter for screening mammogram for malignant neoplasm of breast: Secondary | ICD-10-CM | POA: Diagnosis not present

## 2020-05-10 DIAGNOSIS — M25561 Pain in right knee: Secondary | ICD-10-CM

## 2020-05-10 DIAGNOSIS — T8069XA Other serum reaction due to other serum, initial encounter: Secondary | ICD-10-CM

## 2020-05-10 DIAGNOSIS — I10 Essential (primary) hypertension: Secondary | ICD-10-CM | POA: Diagnosis not present

## 2020-05-10 DIAGNOSIS — E039 Hypothyroidism, unspecified: Secondary | ICD-10-CM

## 2020-05-10 DIAGNOSIS — J4540 Moderate persistent asthma, uncomplicated: Secondary | ICD-10-CM

## 2020-05-10 DIAGNOSIS — E119 Type 2 diabetes mellitus without complications: Secondary | ICD-10-CM

## 2020-05-10 DIAGNOSIS — R05 Cough: Secondary | ICD-10-CM | POA: Diagnosis not present

## 2020-05-10 DIAGNOSIS — J4520 Mild intermittent asthma, uncomplicated: Secondary | ICD-10-CM

## 2020-05-10 DIAGNOSIS — Z1159 Encounter for screening for other viral diseases: Secondary | ICD-10-CM

## 2020-05-10 DIAGNOSIS — J0100 Acute maxillary sinusitis, unspecified: Secondary | ICD-10-CM

## 2020-05-10 DIAGNOSIS — R059 Cough, unspecified: Secondary | ICD-10-CM

## 2020-05-10 LAB — B12 AND FOLATE PANEL
Folate: 8.2 ng/mL (ref >5.9)
Vitamin B-12: 701 pg/mL (ref 211–911)

## 2020-05-10 LAB — CBC WITH DIFFERENTIAL/PLATELET
Basophils Absolute: 0 10*3/uL (ref 0.0–0.1)
Basophils Relative: 0.4 % (ref 0.0–3.0)
Eosinophils Absolute: 0 10*3/uL (ref 0.0–0.7)
Eosinophils Relative: 0.4 % (ref 0.0–5.0)
HCT: 43.8 % (ref 36.0–46.0)
Hemoglobin: 14.9 g/dL (ref 12.0–15.0)
Lymphocytes Relative: 32.9 % (ref 12.0–46.0)
Lymphs Abs: 1.8 10*3/uL (ref 0.7–4.0)
MCHC: 34 g/dL (ref 30.0–36.0)
MCV: 88.1 fl (ref 78.0–100.0)
Monocytes Absolute: 0.4 10*3/uL (ref 0.1–1.0)
Monocytes Relative: 7 % (ref 3.0–12.0)
Neutro Abs: 3.3 10*3/uL (ref 1.4–7.7)
Neutrophils Relative %: 59.3 % (ref 43.0–77.0)
Platelets: 274 10*3/uL (ref 150.0–400.0)
RBC: 4.98 Mil/uL (ref 3.87–5.11)
RDW: 13.8 % (ref 11.5–15.5)
WBC: 5.5 10*3/uL (ref 4.0–10.5)

## 2020-05-10 LAB — LIPID PANEL
Cholesterol: 326 mg/dL — ABNORMAL HIGH (ref 0–200)
HDL: 75.1 mg/dL (ref >39.00)
LDL Cholesterol: 223 mg/dL — ABNORMAL HIGH (ref 0–99)
NonHDL: 251.14
Total CHOL/HDL Ratio: 4
Triglycerides: 142 mg/dL (ref 0.0–149.0)
VLDL: 28.4 mg/dL (ref 0.0–40.0)

## 2020-05-10 LAB — COMPREHENSIVE METABOLIC PANEL
ALT: 23 U/L (ref 0–35)
AST: 20 U/L (ref 0–37)
Albumin: 4.7 g/dL (ref 3.5–5.2)
Alkaline Phosphatase: 84 U/L (ref 39–117)
BUN: 18 mg/dL (ref 6–23)
CO2: 22 mEq/L (ref 19–32)
Calcium: 9.6 mg/dL (ref 8.4–10.5)
Chloride: 106 mEq/L (ref 96–112)
Creatinine, Ser: 0.71 mg/dL (ref 0.40–1.20)
GFR: 87.38 mL/min (ref >60.00)
Glucose, Bld: 108 mg/dL — ABNORMAL HIGH (ref 70–99)
Potassium: 3.8 mEq/L (ref 3.5–5.1)
Sodium: 138 mEq/L (ref 135–145)
Total Bilirubin: 0.5 mg/dL (ref 0.2–1.2)
Total Protein: 7.2 g/dL (ref 6.0–8.3)

## 2020-05-10 LAB — HEMOGLOBIN A1C: Hgb A1c MFr Bld: 6.1 % (ref 4.6–6.5)

## 2020-05-10 LAB — MICROALBUMIN / CREATININE URINE RATIO
Creatinine,U: 28.2 mg/dL
Microalb Creat Ratio: 2.5 mg/g (ref 0.0–30.0)
Microalb, Ur: <0.7 mg/dL (ref 0.0–1.9)

## 2020-05-10 LAB — VITAMIN D 25 HYDROXY (VIT D DEFICIENCY, FRACTURES): VITD: 58.77 ng/mL (ref 30.00–100.00)

## 2020-05-10 LAB — TSH: TSH: 1.16 u[IU]/mL (ref 0.35–4.50)

## 2020-05-10 MED ORDER — MONTELUKAST SODIUM 10 MG PO TABS: 10.0000 mg | ORAL_TABLET | Freq: Every day | ORAL | 3 refills | Status: DC

## 2020-05-10 MED ORDER — PANTOPRAZOLE SODIUM 40 MG PO TBEC: 40.0000 mg | DELAYED_RELEASE_TABLET | Freq: Every day | ORAL | 3 refills | Status: DC

## 2020-05-10 MED ORDER — GABAPENTIN 100 MG PO CAPS: 100.0000 mg | ORAL_CAPSULE | Freq: Every day | ORAL | 1 refills | Status: DC

## 2020-05-10 MED ORDER — FLUTICASONE PROPIONATE 93 MCG/ACT NA EXHU: 1.0000 | INHALANT_SUSPENSION | Freq: Every day | NASAL | 11 refills | Status: DC

## 2020-05-10 MED ORDER — TIOTROPIUM BROMIDE MONOHYDRATE 1.25 MCG/ACT IN AERS: 1.2500 ug | INHALATION_SPRAY | Freq: Every day | RESPIRATORY_TRACT | 3 refills | Status: DC

## 2020-05-10 MED ORDER — ALBUTEROL SULFATE HFA 108 (90 BASE) MCG/ACT IN AERS: INHALATION_SPRAY | RESPIRATORY_TRACT | 3 refills | Status: DC

## 2020-05-10 NOTE — Assessment & Plan Note (Signed)
-   Well controlled on present therapy; No recent exacerbations or oral steriod use. Rare SABA use. - Continues Nucala injections per Allergy/immunology - Refilling SPIRIVA 1.71mcg, Singulair, Flonase and ventolin hfa  - FU in 1 year or sooner if needed

## 2020-05-10 NOTE — Progress Notes (Signed)
@Patient  ID: Bridget Peters, female    DOB: [birth year], 49 y.o.   MRN: [id number]  Chief Complaint  Patient presents with  . Follow-up    pt states breathing is doing well with Nucala.     Referring provider: Burnard Hawthorne, FNP  HPI: 49 year old female, never smoked. PMH significant for moderate persistent asthma, OSA, sinusitis, HTN, aortic atherosclerosis, type 2 diabetes, hypothyroidism. Patient of Dr. Mortimer Fries, last seen on 07/23/20. Maintained on Spiriva respimat 1.79mcg, Singualir, Allergra, Flonase and prn ventolin.   05/10/2020 Patient presents today for for regular follow-up. She is doing well, needs refill of her medication. She is receiving Nucala injections through allergy/immunology. She has had no recent asthma exacerbations. She was prescribed prednisone for poison oak rash. Cough, wheezing and shortness of breath are well controlled on present treatment. She uses Ventolin as needed approx once a month, symptoms usually worse in the fall. She developed trouble with memory, speaking and balance after receiving her first covid vaccine. He works at [employer], received a letter from her PCP hopefully exempting her for second vaccine.    Allergies  Allergen Reactions  . Shellfish Allergy Hives and Shortness Of Breath  . Iodinated Diagnostic Agents Hives    Pt developed hives approximately 6 hours after receiving Isovue 300 for CT Scan. She was given 50mG  oral Benadryl and symptoms improved  . Milk-Related Compounds Hives  . Tamiflu [Oseltamivir Phosphate] Hives  . Wheat Bran Hives  . 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 Acetazolamide Rash  . Xolair [Omalizumab] Itching    Immunization History  Administered Date(s) Administered  . Influenza Split 07/03/2016  . Influenza, Seasonal, Injecte, Preservative Fre 08/05/2015  . PFIZER SARS-COV-2 Vaccination 10/13/2019  . Pneumococcal Conjugate-13 04/24/2017  . Pneumococcal Polysaccharide-23 02/13/2018  .  Pneumococcal-Unspecified 02/12/2012  . Tdap 10/11/2009    Past Medical History:  Diagnosis Date  . Allergy    Seasonal  . Asthma   . Complication of anesthesia    nausea and vomitting  . Depression   . Diabetes mellitus without complication ([place])    Pt takes Metformin.  . Family history of adverse reaction to anesthesia    nausea and vomitting  . Hypertension   . Pneumonia   . Shortness of breath   . Sleep apnea   . Thyroid disease     Tobacco History: Social History   Tobacco Use  Smoking Status Never Smoker  Smokeless Tobacco Never Used   Counseling given: Not Answered   Outpatient Medications Prior to Visit  Medication Sig Dispense Refill  . albuterol (PROVENTIL) (2.5 MG/3ML) 0.083% nebulizer solution Take 3 mLs (2.5 mg total) by nebulization every 6 (six) hours as needed for wheezing or shortness of breath. 75 mL 12  . amLODipine (NORVASC) 2.5 MG tablet TAKE 1 TABLET BY MOUTH DAILY. 90 tablet 3  . cetirizine (ZYRTEC ALLERGY) 10 MG tablet Take 1 tablet (10 mg total) by mouth daily. 30 tablet 6  . EPIPEN 2-PAK 0.3 MG/0.3ML SOAJ injection   11  . fexofenadine (ALLEGRA) 180 MG tablet Take 1 tablet (180 mg total) by mouth daily. 30 tablet 0  . gabapentin (NEURONTIN) 100 MG capsule Take 1 capsule (100 mg total) by mouth at bedtime. 90 capsule 1  . INVOKANA 300 MG TABS tablet TAKE 1 TABLET BY MOUTH DAILY. 90 tablet 3  . levothyroxine (SYNTHROID) 75 MCG tablet TAKE 1 TABLET BY MOUTH ONCE DAILY 90 tablet 0  . meloxicam (MOBIC)  7.5 MG tablet Take 1 tablet (7.5 mg total) by mouth daily as needed for pain. 30 tablet 1  . RESTASIS MULTIDOSE 0.05 % ophthalmic emulsion     . silver sulfADIAZINE (SILVADENE) 1 % cream Apply 1 application topically daily. 50 g 0  . topiramate (TOPAMAX) 100 MG tablet Take 1 tablet twice daily 180 tablet 3  . albuterol (VENTOLIN HFA) 108 (90 Base) MCG/ACT inhaler INHALE 2 PUFFS INTO THE LUNGS EVERY 4 HOURS AS NEEDED FOR WHEEZING OR SHORTNESS OF BREATH.  18 g 0  . Fluticasone Propionate 93 MCG/ACT EXHU Place 1 Act into the nose daily.    . montelukast (SINGULAIR) 10 MG tablet Take 10 mg by mouth daily.    . pantoprazole (PROTONIX) 40 MG tablet TAKE 1 TABLET BY MOUTH DAILY. 60 tablet 0  . Tiotropium Bromide Monohydrate 1.25 MCG/ACT AERS Inhale 1.25 mcg into the lungs daily.    Marland Kitchen amoxicillin-clavulanate (AUGMENTIN) 875-125 MG tablet Take 1 tablet by mouth 2 (two) times daily. 20 tablet 0  . doxycycline (VIBRA-TABS) 100 MG tablet Take 1 tablet (100 mg total) by mouth 2 (two) times daily. 20 tablet 0  . indomethacin (INDOCIN) 25 MG capsule Take 1 capsule by mouth up to three times daily as needed 15 capsule 0  . predniSONE (STERAPRED UNI-PAK 21 TAB) 10 MG (21) TBPK tablet Use as directed 21 tablet 0   Facility-Administered Medications Prior to Visit  Medication Dose Route Frequency Provider Last Rate Last Admin  . Mepolizumab SOLR 100 mg  100 mg Subcutaneous Q28 days Flora Lipps, MD   100 mg at 08/03/17 6948    Review of Systems  Review of Systems  Constitutional: Negative.   Respiratory: Negative for cough, chest tightness and wheezing.    Physical Exam  BP 132/78 (BP Location: Left Arm, Cuff Size: Normal)   Pulse 73   Temp 97.7 F (36.5 C) (Temporal)   Ht 5\' 2"  (1.575 m)   Wt 150 lb 12.8 oz (68.4 kg)   SpO2 98%   BMI 27.58 kg/m  Physical Exam Constitutional:      Appearance: Normal appearance.  HENT:     Head: Normocephalic and atraumatic.     Mouth/Throat:     Mouth: Mucous membranes are moist.     Pharynx: Oropharynx is clear.  Cardiovascular:     Rate and Rhythm: Normal rate and regular rhythm.  Pulmonary:     Effort: Pulmonary effort is normal.     Breath sounds: Normal breath sounds. No wheezing or rhonchi.  Musculoskeletal:        General: Normal range of motion.  Skin:    General: Skin is warm and dry.  Neurological:     General: No focal deficit present.     Mental Status: She is alert and oriented to person,  place, and time. Mental status is at baseline.  Psychiatric:        Mood and Affect: Mood normal.        Behavior: Behavior normal.        Thought Content: Thought content normal.        Judgment: Judgment normal.      Lab Results:  CBC    Component Value Date/Time   WBC 5.9 03/25/2020 0837   RBC 5.10 03/25/2020 0837   HGB 15.0 03/25/2020 0837   HCT 44.9 03/25/2020 0837   PLT 230 03/25/2020 0837   MCV 88.0 03/25/2020 0837   MCH 29.4 03/25/2020 0837   MCHC 33.4 03/25/2020 0837  RDW 13.0 03/25/2020 0837   LYMPHSABS 1.9 03/21/2019 1031   MONOABS 0.3 03/21/2019 1031   EOSABS 0.0 03/21/2019 1031   BASOSABS 0.0 03/21/2019 1031    BMET    Component Value Date/Time   NA 138 03/21/2019 1031   K 4.4 03/21/2019 1031   CL 101 03/21/2019 1031   CO2 27 03/21/2019 1031   GLUCOSE 150 (H) 03/21/2019 1031   BUN 18 03/21/2019 1031   CREATININE 0.60 10/29/2019 1044   CREATININE 0.90 05/14/2017 1220   CALCIUM 9.6 03/21/2019 1031   GFRNONAA >60 06/06/2018 0721   GFRAA >60 06/06/2018 0721    BNP No results found for: BNP  ProBNP No results found for: PROBNP  Imaging: No results found.   Assessment & Plan:   Asthma, moderate persistent - Well controlled on present therapy; No recent exacerbations or oral steriod use. Rare SABA use. - Continues Nucala injections per Allergy/immunology - Refilling SPIRIVA 1.21mcg, Singulair, Flonase and ventolin hfa  - FU in 1 year or sooner if needed   Martyn Ehrich, NP 05/10/2020

## 2020-05-10 NOTE — Progress Notes (Signed)
Subjective:    Patient ID: Bridget Peters, female    DOB: [birth year], 49 y.o.   MRN: [id number]  CC: Bridget Peters is a 49 y.o. female who presents today for follow up.   HPI: Here to discuss allergy to [place] covid vaccine.  She received the vaccine on 10/21/2019 in about 45 minutes later she presented emergency room for right arm and leg numbness.  She also had tingling around her mouth.  No trouble swallowing or shortness of breath at that time.  She does not feel comfortable nor safe in receiving a second Pfizer vaccine as required by her employer. Since the vaccine, she continues to experience right leg tingling for which she takes Topamax.  She had some improvement while on medication.  HTN- at home 90/60. 'stressed when comes I doctors office'.  Denies chest pain, shortness of breath  DM-compliant with Invokana  Would like gabapentin refill for left hip pain.   Intentionally loosing weight through lifestyle changes.     On topamax 50mg  with Dr Felecia Shelling for trouble with word finding , HA.  HISTORY:  Past Medical History:  Diagnosis Date  . Allergy    Seasonal  . Asthma   . Complication of anesthesia    nausea and vomitting  . Depression   . Diabetes mellitus without complication ([place])    Pt takes Metformin.  . Family history of adverse reaction to anesthesia    nausea and vomitting  . Hypertension   . Pneumonia   . Shortness of breath   . Sleep apnea   . Thyroid disease    Past Surgical History:  Procedure Laterality Date  . BREAST SURGERY  01/2013   Breast Reduction   . NASAL SINUS SURGERY    . NASAL SINUS SURGERY  02/2014  . REDUCTION MAMMAPLASTY Bilateral 2014  . WISDOM TOOTH EXTRACTION     x4   Family History  Problem Relation Age of Onset  . Hypertension Mother   . Endometrial cancer Mother 40  . Hypertension Father   . Colon polyps Father   . Cancer Maternal Aunt        Breast Cancer  . Breast cancer Maternal Aunt   . Breast cancer Paternal  Aunt        Breast Cancer  . Cancer Maternal Grandmother 61       Bladder Cancer  . Stroke Maternal Grandmother   . Breast cancer Maternal Grandmother 60  . Cancer Maternal Grandfather 47       Esophagus Cancer  . Colon cancer Neg Hx   . Ovarian cancer Neg Hx     Allergies: Shellfish allergy, Iodinated diagnostic agents, Milk-related compounds, Tamiflu [oseltamivir phosphate], Wheat bran, Covid-19 (adenovirus) vaccine, Acetazolamide, and Xolair [omalizumab] Current Outpatient Medications on File Prior to Visit  Medication Sig Dispense Refill  . albuterol (PROVENTIL) (2.5 MG/3ML) 0.083% nebulizer solution Take 3 mLs (2.5 mg total) by nebulization every 6 (six) hours as needed for wheezing or shortness of breath. 75 mL 12  . albuterol (VENTOLIN HFA) 108 (90 Base) MCG/ACT inhaler INHALE 2 PUFFS INTO THE LUNGS EVERY 4 HOURS AS NEEDED FOR WHEEZING OR SHORTNESS OF BREATH. 18 g 0  . amLODipine (NORVASC) 2.5 MG tablet TAKE 1 TABLET BY MOUTH DAILY. 90 tablet 3  . amoxicillin-clavulanate (AUGMENTIN) 875-125 MG tablet Take 1 tablet by mouth 2 (two) times daily. 20 tablet 0  . cetirizine (ZYRTEC ALLERGY) 10 MG tablet Take 1 tablet (10 mg total) by mouth daily. [place]  tablet 6  . doxycycline (VIBRA-TABS) 100 MG tablet Take 1 tablet (100 mg total) by mouth 2 (two) times daily. 20 tablet 0  . EPIPEN 2-PAK 0.3 MG/0.3ML SOAJ injection   11  . fexofenadine (ALLEGRA) 180 MG tablet Take 1 tablet (180 mg total) by mouth daily. 30 tablet 0  . Fluticasone Propionate 93 MCG/ACT EXHU Place 1 Act into the nose daily.    . indomethacin (INDOCIN) 25 MG capsule Take 1 capsule by mouth up to three times daily as needed 15 capsule 0  . INVOKANA 300 MG TABS tablet TAKE 1 TABLET BY MOUTH DAILY. 90 tablet 3  . levothyroxine (SYNTHROID) 75 MCG tablet TAKE 1 TABLET BY MOUTH ONCE DAILY 90 tablet 0  . meloxicam (MOBIC) 7.5 MG tablet Take 1 tablet (7.5 mg total) by mouth daily as needed for pain. 30 tablet 1  . pantoprazole  (PROTONIX) 40 MG tablet TAKE 1 TABLET BY MOUTH DAILY. 60 tablet 0  . predniSONE (STERAPRED UNI-PAK 21 TAB) 10 MG (21) TBPK tablet Use as directed 21 tablet 0  . silver sulfADIAZINE (SILVADENE) 1 % cream Apply 1 application topically daily. 50 g 0  . Tiotropium Bromide Monohydrate 1.25 MCG/ACT AERS Inhale 1.25 mcg into the lungs daily.    Marland Kitchen topiramate (TOPAMAX) 100 MG tablet Take 1 tablet twice daily 180 tablet 3  . montelukast (SINGULAIR) 10 MG tablet Take 10 mg by mouth daily.    . RESTASIS MULTIDOSE 0.05 % ophthalmic emulsion      Current Facility-Administered Medications on File Prior to Visit  Medication Dose Route Frequency Provider Last Rate Last Admin  . Mepolizumab SOLR 100 mg  100 mg Subcutaneous Q28 days Flora Lipps, MD   100 mg at 08/03/17 9702    Social History   Tobacco Use  . Smoking status: Never Smoker  . Smokeless tobacco: Never Used  Substance Use Topics  . Alcohol use: Yes    Alcohol/week: 0.0 standard drinks  . Drug use: No    Review of Systems  Constitutional: Negative for chills and fever.  Respiratory: Negative for cough.   Cardiovascular: Negative for chest pain and palpitations.  Gastrointestinal: Negative for nausea and vomiting.  Neurological: Positive for numbness.      Objective:    BP 130/86 (BP Location: Left Arm, Patient Position: Sitting)   Pulse 72   Temp (!) 97 F (36.1 C)   Ht 5' 2.01" (1.575 m)   Wt 150 lb (68 kg)   SpO2 99%   BMI 27.43 kg/m  BP Readings from Last 3 Encounters:  05/10/20 130/86  01/27/20 (!) 172/100  12/09/19 (!) 179/90   Wt Readings from Last 3 Encounters:  05/10/20 150 lb (68 kg)  01/27/20 160 lb (72.6 kg)  12/09/19 159 lb (72.1 kg)    Physical Exam Vitals reviewed.  Constitutional:      Appearance: She is well-developed.  Eyes:     Conjunctiva/sclera: Conjunctivae normal.  Cardiovascular:     Rate and Rhythm: Normal rate and regular rhythm.     Pulses: Normal pulses.     Heart sounds: Normal heart  sounds.  Pulmonary:     Effort: Pulmonary effort is normal.     Breath sounds: Normal breath sounds. No wheezing, rhonchi or rales.  Skin:    General: Skin is warm and dry.  Neurological:     Mental Status: She is alert.  Psychiatric:        Speech: Speech normal.  Behavior: Behavior normal.        Thought Content: Thought content normal.        Assessment & Plan:   Problem List Items Addressed This Visit      Cardiovascular and Mediastinum   HTN (hypertension) - Primary    Somewhat elevated today.  Patient politely declines increasing amlodipine at this time.  She is aggressively working on weight loss which likely will contribute to lowering blood pressure.  Patient will keep a blood pressure log at home and call me with any escalations prior to follow-up appointment      Relevant Orders   Comprehensive metabolic panel     Endocrine   Diabetes mellitus type 2, controlled, without complications ([place])    Compliant with Invokana.  Pending A1c      Relevant Orders   Hemoglobin A1c   Lipid panel   Microalbumin / creatinine urine ratio   Hypothyroidism   Relevant Orders   TSH   CBC with Differential/Platelet     Other   Allergic reaction to vaccine    Patient has a longstanding history of allergies.  Most recently concern for allergies to Covid by the vaccine.  I provided her with paperwork to be excluded from this from her employer.      Right knee pain   Relevant Medications   gabapentin (NEURONTIN) 100 MG capsule    Other Visit Diagnoses    Left hip pain       Relevant Medications   gabapentin (NEURONTIN) 100 MG capsule   Other Relevant Orders   VITAMIN D 25 Hydroxy (Vit-D Deficiency, Fractures)   B12 and Folate Panel   Encounter for hepatitis C screening test for low risk patient       Relevant Orders   Hepatitis C antibody   Encounter for screening mammogram for malignant neoplasm of breast       Relevant Orders   MM 3D SCREEN BREAST BILATERAL         I am having Jadelin E. Shin maintain her EpiPen 2-Pak, fexofenadine, cetirizine, silver sulfADIAZINE, albuterol, Tiotropium Bromide Monohydrate, Fluticasone Propionate, meloxicam, indomethacin, Invokana, topiramate, albuterol, levothyroxine, pantoprazole, doxycycline, predniSONE, amoxicillin-clavulanate, amLODipine, Restasis MultiDose, montelukast, and gabapentin. We will continue to administer Mepolizumab.   Meds ordered this encounter  Medications  . gabapentin (NEURONTIN) 100 MG capsule    Sig: Take 1 capsule (100 mg total) by mouth at bedtime.    Dispense:  90 capsule    Refill:  1    Order Specific Question:   Supervising Provider    Answer:   Crecencio Mc [[id number]]    Return precautions given.   Risks, benefits, and alternatives of the medications and treatment plan prescribed today were discussed, and patient expressed understanding.   Education regarding symptom management and diagnosis given to patient on AVS.  Continue to follow with Burnard Hawthorne, FNP for routine health maintenance.   Chanice E Arvanitis and I agreed with plan.   Mable Paris, FNP

## 2020-05-10 NOTE — Assessment & Plan Note (Signed)
Compliant with Invokana.  Pending A1c

## 2020-05-10 NOTE — Telephone Encounter (Signed)
lvm to schedule physical in 2 months

## 2020-05-10 NOTE — Patient Instructions (Signed)
Pleasure seeing you today for [place]  Rx: Refilled Spiriva, Singulair, Flonase, Ventolin (Sent to [facility])  Follow-up: 1 year with Dr. Mortimer Fries or sooner if needed     Asthma, Adult  Asthma is a long-term (chronic) condition in which the airways get tight and narrow. The airways are the breathing passages that lead from the nose and mouth down into the lungs. A person with asthma will have times when symptoms get worse. These are called asthma attacks. They can cause coughing, whistling sounds when you breathe (wheezing), shortness of breath, and chest pain. They can make it hard to breathe. There is no cure for asthma, but medicines and lifestyle changes can help control it. There are many things that can bring on an asthma attack or make asthma symptoms worse (triggers). Common triggers include:  Mold.  Dust.  Cigarette smoke.  Cockroaches.  Things that can cause allergy symptoms (allergens). These include animal skin flakes (dander) and pollen from trees or grass.  Things that pollute the air. These may include household cleaners, wood smoke, smog, or chemical odors.  Cold air, weather changes, and wind.  Crying or laughing hard.  Stress.  Certain medicines or drugs.  Certain foods such as dried fruit, potato chips, and grape juice.  Infections, such as a cold or the flu.  Certain medical conditions or diseases.  Exercise or tiring activities. Asthma may be treated with medicines and by staying away from the things that cause asthma attacks. Types of medicines may include:  Controller medicines. These help prevent asthma symptoms. They are usually taken every day.  Fast-acting reliever or rescue medicines. These quickly relieve asthma symptoms. They are used as needed and provide short-term relief.  Allergy medicines if your attacks are brought on by allergens.  Medicines to help control the body's defense (immune) system. Follow these instructions at  home: Avoiding triggers in your home  Change your heating and air conditioning filter often.  Limit your use of fireplaces and wood stoves.  Get rid of pests (such as roaches and mice) and their droppings.  Throw away plants if you see mold on them.  Clean your floors. Dust regularly. Use cleaning products that do not smell.  Have someone vacuum when you are not home. Use a vacuum cleaner with a HEPA filter if possible.  Replace carpet with wood, tile, or vinyl flooring. Carpet can trap animal skin flakes and dust.  Use allergy-proof pillows, mattress covers, and box spring covers.  Wash bed sheets and blankets every week in hot water. Dry them in a dryer.  Keep your bedroom free of any triggers.  Avoid pets and keep windows closed when things that cause allergy symptoms are in the air.  Use blankets that are made of polyester or cotton.  Clean bathrooms and kitchens with bleach. If possible, have someone repaint the walls in these rooms with mold-resistant paint. Keep out of the rooms that are being cleaned and painted.  Wash your hands often with soap and water. If soap and water are not available, use hand sanitizer.  Do not allow anyone to smoke in your home. General instructions  Take over-the-counter and prescription medicines only as told by your doctor. ? Talk with your doctor if you have questions about how or when to take your medicines. ? Make note if you need to use your medicines more often than usual.  Do not use any products that contain nicotine or tobacco, such as cigarettes and e-cigarettes. If you need  help quitting, ask your doctor.  Stay away from secondhand smoke.  Avoid doing things outdoors when allergen counts are high and when air quality is low.  Wear a ski mask when doing outdoor activities in the winter. The mask should cover your nose and mouth. Exercise indoors on cold days if you can.  Warm up before you exercise. Take time to cool down  after exercise.  Use a peak flow meter as told by your doctor. A peak flow meter is a tool that measures how well the lungs are working.  Keep track of the peak flow meter's readings. Write them down.  Follow your asthma action plan. This is a written plan for taking care of your asthma and treating your attacks.  Make sure you get all the shots (vaccines) that your doctor recommends. Ask your doctor about a flu shot and a pneumonia shot.  Keep all follow-up visits as told by your doctor. This is important. Contact a doctor if:  You have wheezing, shortness of breath, or a cough even while taking medicine to prevent attacks.  The mucus you cough up (sputum) is thicker than usual.  The mucus you cough up changes from clear or white to yellow, green, gray, or bloody.  You have problems from the medicine you are taking, such as: ? A rash. ? Itching. ? Swelling. ? Trouble breathing.  You need reliever medicines more than 2-3 times a week.  Your peak flow reading is still at 50-79% of your personal best after following the action plan for 1 hour.  You have a fever. Get help right away if:  You seem to be worse and are not responding to medicine during an asthma attack.  You are short of breath even at rest.  You get short of breath when doing very little activity.  You have trouble eating, drinking, or talking.  You have chest pain or tightness.  You have a fast heartbeat.  Your lips or fingernails start to turn blue.  You are light-headed or dizzy, or you faint.  Your peak flow is less than 50% of your personal best.  You feel too tired to breathe normally. Summary  Asthma is a long-term (chronic) condition in which the airways get tight and narrow. An asthma attack can make it hard to breathe.  Asthma cannot be cured, but medicines and lifestyle changes can help control it.  Make sure you understand how to avoid triggers and how and when to use your  medicines. This information is not intended to replace advice given to you by your health care provider. Make sure you discuss any questions you have with your health care provider. Document Revised: 11/28/2018 Document Reviewed: 10/30/2016 Elsevier Patient Education  2020 [employer].

## 2020-05-10 NOTE — Assessment & Plan Note (Signed)
Somewhat elevated today.  Patient politely declines increasing amlodipine at this time.  She is aggressively working on weight loss which likely will contribute to lowering blood pressure.  Patient will keep a blood pressure log at home and call me with any escalations prior to follow-up appointment

## 2020-05-10 NOTE — Patient Instructions (Addendum)
Blood pressure elevated today  Monitor blood pressure at home and me 5-6 reading on separate days. Goal is less than 120/80, based on newest guidelines, however we certainly want to be less than 130/80;  if persistently higher, please make sooner follow up appointment so we can recheck you blood pressure and manage/ adjust medications.  Please call  and schedule your 3D mammogram as discussed.   [place]  [place], [place]

## 2020-05-10 NOTE — Assessment & Plan Note (Signed)
Patient has a longstanding history of allergies.  Most recently concern for allergies to Covid by the vaccine.  I provided her with paperwork to be excluded from this from her employer.

## 2020-05-11 ENCOUNTER — Telehealth: Payer: Self-pay | Admitting: Primary Care

## 2020-05-11 DIAGNOSIS — J4550 Severe persistent asthma, uncomplicated: Secondary | ICD-10-CM | POA: Diagnosis not present

## 2020-05-11 LAB — HEPATITIS C ANTIBODY
Hepatitis C Ab: NONREACTIVE
SIGNAL TO CUT-OFF: 0.01 (ref <1.00)

## 2020-05-11 NOTE — Telephone Encounter (Signed)
PA request was received from (pharmacy): [facility] LVDIX:[phone] Fax: [phone] Medication name and strength: Spiriva Respimat 1.25 mcg/act aerosol Ordering Provider: Geraldo Pitter  Was PA started with [facility]?: yes If yes, please enter KEY: [id number] Medication tried and failed: Breo Ellipta, Dulera 100-5, Incruse Ellipta Covered Alternatives: unknown  PA sent to plan, time frame for approval / denial: undetermined Routing to [employer] [facility]) for follow-up

## 2020-05-12 NOTE — Telephone Encounter (Signed)
Checked status of PA and it was denied due to the request being made for 3 inhaler per 30 days.   There was an option to submit an appeal electronically.  I stated that patient would only need 1 inhaler per 30 days or 3 inhalers per 90 days and that the original requested quantity was incorrect.    Will await appeal decision.

## 2020-05-17 MED ORDER — SPIRIVA RESPIMAT 1.25 MCG/ACT IN AERS
2.0000 | INHALATION_SPRAY | Freq: Every day | RESPIRATORY_TRACT | 11 refills | Status: DC
Start: 2020-05-17 — End: 2021-07-01

## 2020-05-17 NOTE — Telephone Encounter (Signed)
Send in regular quantity

## 2020-05-17 NOTE — Telephone Encounter (Signed)
Sent in new RX for inhaler with correct quantity. Will see if it goes through if it does not new PA will be started for patient. Nothing further needed at this time.

## 2020-05-19 ENCOUNTER — Telehealth: Payer: Self-pay

## 2020-05-19 NOTE — Telephone Encounter (Signed)
-----   Message from Burnard Hawthorne, [place] sent at 05/11/2020  9:28 PM EDT ----- Ensure patient seen my chart result note.  She okay with starting Crestor 20 mg p.o. nightly?   If so, please send in.  If she starts medication she will need to recheck her CMP lab in 6 weeks, please order and schedule as well  Let me know if cannot reach patient.

## 2020-05-19 NOTE — Telephone Encounter (Signed)
Attempted to call the patient 3 times. Lab results have been mailed to the patient. She needs to be scheduled for repeat CMP in 6 weeks.

## 2020-05-28 ENCOUNTER — Other Ambulatory Visit: Payer: Self-pay | Admitting: Family

## 2020-05-28 DIAGNOSIS — E039 Hypothyroidism, unspecified: Secondary | ICD-10-CM

## 2020-05-31 ENCOUNTER — Telehealth (HOSPITAL_COMMUNITY): Payer: Self-pay | Admitting: Nurse Practitioner

## 2020-05-31 ENCOUNTER — Encounter: Payer: Self-pay | Admitting: Family

## 2020-05-31 NOTE — Telephone Encounter (Signed)
Called to discuss with Bridget Peters about Covid symptoms and the use of casirivimab/imdevimab, a combination monoclonal antibody infusion for those with mild to moderate Covid symptoms and at a high risk of hospitalization.     Pt is qualified for this infusion at the [facility] infusion center due to co-morbid conditions (as indicated below) however declines infusion at this time. Would like to have some time think about infusion and will reach out if interested.   Symptoms tier reviewed as well as criteria for ending isolation.  Symptoms reviewed that would warrant ED/Hospital evaluation. Preventative practices reviewed. Patient verbalized understanding. Patient advised to call back if he decides that he does want to get infusion. Callback number to the infusion center given. Patient advised to go to Urgent care or ED with severe symptoms. Last date she would be eligible for infusion is 06/06/20.     Patient Active Problem List   Diagnosis Date Noted  . Word finding difficulty 01/27/2020  . 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 Other headache syndrome 12/09/2019  . Nonintractable headache 11/03/2019  . Numbness 11/03/2019  . Neck pain 11/03/2019  . Right knee pain 10/22/2019  . PMB (postmenopausal bleeding) 11/04/2018  . Routine physical examination 05/20/2018  . Vaginal bleeding 05/20/2018  . Aortic atherosclerosis ([place]) 04/15/2018  . Osteopenia 05/14/2017  . OSA (obstructive sleep apnea) 04/24/2017  . Elevated hemoglobin ([place]) 03/09/2017  . Fatty liver 01/17/2017  . Fatigue 01/01/2017  . HTN (hypertension) 01/01/2017  . 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 Anxiety and depression 07/11/2016  . Allergic reaction to vaccine 06/13/2016  . Hypercholesteremia 04/26/2016  . Screening mammogram, encounter for 07/14/2015  . Hypothyroidism 07/14/2015  . Diabetes mellitus type 2, controlled, without complications ([place]) 82/80/0349  . Multiple environmental allergies 07/14/2015  . Asthma,  moderate persistent 07/14/2015    Alda Lea, AGPCNP-BC

## 2020-05-31 NOTE — Telephone Encounter (Signed)
I called patient to let her know that we would refer her to Outpatient Infusion Clinic given her comorbidities. Pt verbalized understanding & I have contacted clinic with patient information for her to be reached out to. Marland Kitchen

## 2020-06-06 ENCOUNTER — Telehealth: Payer: 59 | Admitting: Physician Assistant

## 2020-06-06 DIAGNOSIS — R52 Pain, unspecified: Secondary | ICD-10-CM

## 2020-06-06 DIAGNOSIS — U071 COVID-19: Secondary | ICD-10-CM | POA: Diagnosis not present

## 2020-06-06 DIAGNOSIS — R05 Cough: Secondary | ICD-10-CM

## 2020-06-06 DIAGNOSIS — R059 Cough, unspecified: Secondary | ICD-10-CM

## 2020-06-06 MED ORDER — NAPROXEN 500 MG PO TABS
500.0000 mg | ORAL_TABLET | Freq: Two times a day (BID) | ORAL | 0 refills | Status: DC
Start: 2020-06-06 — End: 2022-03-22

## 2020-06-06 MED ORDER — BENZONATATE 100 MG PO CAPS: 100.0000 mg | ORAL_CAPSULE | Freq: Three times a day (TID) | ORAL | 0 refills | Status: DC | PRN

## 2020-06-06 MED ORDER — FLUTICASONE PROPIONATE 50 MCG/ACT NA SUSP
2.0000 | Freq: Every day | NASAL | 6 refills | Status: DC
Start: 2020-06-06 — End: 2021-07-01

## 2020-06-06 NOTE — Progress Notes (Signed)
E-Visit for [employer] Virus Screening  You have tested positive for COVID-19, meaning that you were infected with the novel coronavirus and could give the germ to others.    You have been enrolled in [facility] for COVID-19. Daily you will receive a questionnaire within the [facility] website. Our COVID-19 response team will be monitoring your responses daily.  Please continue isolation at home, for at least 10 days since the start of your symptoms and until you have had 24 hours with no fever (without taking a fever reducer) and with improving of symptoms.  Please continue good preventive care measures, including:  frequent hand-washing, avoid touching your face, cover coughs/sneezes, stay out of crowds and keep a 6 foot distance from others.  Follow up with your provider or go to the nearest hospital ED for re-assessment if fever/cough/breathlessness return.  The following symptoms may appear 2-14 days after exposure: . Fever . Cough . Shortness of breath or difficulty breathing . Chills . Repeated shaking with chills . Muscle pain . Headache . Sore throat . New loss of taste or smell . Fatigue . Congestion or runny nose . Nausea or vomiting . Diarrhea  Go to the nearest hospital ED for assessment if fever/cough/breathlessness are severe or illness seems like a threat to life.  It is vitally important that if you feel that you have an infection such as this virus or any other virus that you stay home and away from places where you may spread it to others.  You should avoid contact with people age 49 and older.   You can use medication such as A prescription cough medication called Tessalon Perles 100 mg. You may take 1-2 capsules every 8 hours as needed for cough and I have prescribed Naprosyn 500 mg. Take twice daily as needed for fever or body aches for 2 weeks   Stay well hydrated. Drink enough water and fluids to keep your urine clear or pale yellow. Get lost of rest. Wash  your hands often.   Advil or ibuprofen for pain. Do not take Aspirin.   Cepacol throat lozenges. Gargle with 8 oz of salt water ( tsp of salt per 1 qt of water) as often as every 1-2 hours to soothe your throat. For sore throat try using a honey-based tea. Use 3 teaspoons of honey with juice squeezed from half lemon. Place shaved pieces of ginger into 1/2-1 cup of water and warm over stove top. Then mix the ingredients and repeat every 4 hours as needed.  -Foods that can help speed recovery: honey, garlic, chicken soup, elderberries, green tea.  -Supplements that can help speed recovery: vitamin C, zinc, elderberry extract   You may also take acetaminophen (Tylenol) as needed for fever.  Reduce your risk of any infection by using the same precautions used for avoiding the common cold or flu:  Marland Kitchen Wash your hands often with soap and warm water for at least 20 seconds.  If soap and water are not readily available, use an alcohol-based hand sanitizer with at least 60% alcohol.  . If coughing or sneezing, cover your mouth and nose by coughing or sneezing into the elbow areas of your shirt or coat, into a tissue or into your sleeve (not your hands). . Avoid shaking hands with others and consider head nods or verbal greetings only. . Avoid touching your eyes, nose, or mouth with unwashed hands.  . Avoid close contact with people who are sick. . Avoid places or events  with large numbers of people in one location, like concerts or sporting events. . Carefully consider travel plans you have or are making. . If you are planning any travel outside or inside the [place], visit the CDC's Travelers' Health webpage for the latest health notices. . If you have some symptoms but not all symptoms, continue to monitor at home and seek medical attention if your symptoms worsen. . If you are having a medical emergency, call 911.  HOME CARE . Only take medications as instructed by your medical team. . Drink plenty  of fluids and get plenty of rest. . A steam or ultrasonic humidifier can help if you have congestion.   GET HELP RIGHT AWAY IF YOU HAVE EMERGENCY WARNING SIGNS** FOR COVID-19. If you or someone is showing any of these signs seek emergency medical care immediately. Call 911 or proceed to your closest emergency facility if: . You develop worsening high fever. . Trouble breathing . Bluish lips or face . Persistent pain or pressure in the chest . New confusion . Inability to wake or stay awake . You cough up blood. . Your symptoms become more severe  **This list is not all possible symptoms. Contact your medical provider for any symptoms that are sever or concerning to you.  MAKE SURE YOU   Understand these instructions.  Will watch your condition.  Will get help right away if you are not doing well or get worse.  Your e-visit answers were reviewed by a board certified advanced clinical practitioner to complete your personal care plan.  Depending on the condition, your plan could have included both over the counter or prescription medications.  If there is a problem please reply once you have received a response from your provider.  Your safety is important to [place].  If you have drug allergies check your prescription carefully.    You can use MyChart to ask questions about today's visit, request a non-urgent call back, or ask for a work or school excuse for 24 hours related to this e-Visit. If it has been greater than 24 hours you will need to follow up with your provider, or enter a new e-Visit to address those concerns. You will get an e-mail in the next two days asking about your experience.  I hope that your e-visit has been valuable and will speed your recovery. Thank you for using e-visits.      Greater than 5 minutes, yet less than 10 minutes of time have been spent researching, coordinating and implementing care for this patient today.

## 2020-06-07 ENCOUNTER — Encounter (INDEPENDENT_AMBULATORY_CARE_PROVIDER_SITE_OTHER): Payer: Self-pay

## 2020-06-17 DIAGNOSIS — J4550 Severe persistent asthma, uncomplicated: Secondary | ICD-10-CM | POA: Diagnosis not present

## 2020-06-25 ENCOUNTER — Other Ambulatory Visit: Payer: Self-pay

## 2020-06-25 ENCOUNTER — Ambulatory Visit
Admission: RE | Admit: 2020-06-25 | Discharge: 2020-06-25 | Disposition: A | Discharge status: Home / Self Care | Payer: 59 | Source: Ambulatory Visit | Attending: Family | Admitting: Family

## 2020-06-25 DIAGNOSIS — Z1231 Encounter for screening mammogram for malignant neoplasm of breast: Secondary | ICD-10-CM | POA: Insufficient documentation

## 2020-06-29 ENCOUNTER — Other Ambulatory Visit: Payer: Self-pay | Admitting: Family

## 2020-06-29 DIAGNOSIS — Z1231 Encounter for screening mammogram for malignant neoplasm of breast: Secondary | ICD-10-CM

## 2020-07-14 ENCOUNTER — Other Ambulatory Visit: Payer: Self-pay

## 2020-07-14 ENCOUNTER — Ambulatory Visit (INDEPENDENT_AMBULATORY_CARE_PROVIDER_SITE_OTHER): Payer: 59 | Admitting: Family

## 2020-07-14 ENCOUNTER — Encounter: Payer: Self-pay | Admitting: Family

## 2020-07-14 VITALS — BP 128/80 | HR 83 | Temp 98.3°F | Ht 63.0 in | Wt 144.2 lb

## 2020-07-14 DIAGNOSIS — E039 Hypothyroidism, unspecified: Secondary | ICD-10-CM | POA: Diagnosis not present

## 2020-07-14 DIAGNOSIS — E119 Type 2 diabetes mellitus without complications: Secondary | ICD-10-CM

## 2020-07-14 DIAGNOSIS — I10 Essential (primary) hypertension: Secondary | ICD-10-CM

## 2020-07-14 DIAGNOSIS — Z0000 Encounter for general adult medical examination without abnormal findings: Secondary | ICD-10-CM

## 2020-07-14 NOTE — Assessment & Plan Note (Signed)
Over all controlled. Would like her to be < 120/80 however as patient is actively, aggressively pursuing lifestyle changes, we opted to make no further changes.  Continue amlodipine 2.5mg 

## 2020-07-14 NOTE — Patient Instructions (Addendum)
As discussed, an annual skin exam is very important.  Please call and make an appointment to be evaluated.  Options in [place]  include: [facility] [phone])  or [facility] [phone])    ref to GI Let us know if you dont hear back within a week in regards to an appointment being scheduled.    Always nice to see you!   Health Maintenance, Female Adopting a healthy lifestyle and getting preventive care are important in promoting health and wellness. Ask your health care provider about:  The right schedule for you to have regular tests and exams.  Things you can do on your own to prevent diseases and keep yourself healthy. What should I know about diet, weight, and exercise? Eat a healthy diet   Eat a diet that includes plenty of vegetables, fruits, low-fat dairy products, and lean protein.  Do not eat a lot of foods that are high in solid fats, added sugars, or sodium. Maintain a healthy weight Body mass index (BMI) is used to identify weight problems. It estimates body fat based on height and weight. Your health care provider can help determine your BMI and help you achieve or maintain a healthy weight. Get regular exercise Get regular exercise. This is one of the most important things you can do for your health. Most adults should:  Exercise for at least 150 minutes each week. The exercise should increase your heart rate and make you sweat (moderate-intensity exercise).  Do strengthening exercises at least twice a week. This is in addition to the moderate-intensity exercise.  Spend less time sitting. Even light physical activity can be beneficial. Watch cholesterol and blood lipids Have your blood tested for lipids and cholesterol at 49 years of age, then have this test every 5 years. Have your cholesterol levels checked more often if:  Your lipid or cholesterol levels are high.  You are older than 49 years of age.  You are at high risk for heart  disease. What should I know about cancer screening? Depending on your health history and family history, you may need to have cancer screening at various ages. This may include screening for:  Breast cancer.  Cervical cancer.  Colorectal cancer.  Skin cancer.  Lung cancer. What should I know about heart disease, diabetes, and high blood pressure? Blood pressure and heart disease  High blood pressure causes heart disease and increases the risk of stroke. This is more likely to develop in people who have high blood pressure readings, are of African descent, or are overweight.  Have your blood pressure checked: ? Every 3-5 years if you are 29-12 years of age. ? Every year if you are 72 years old or older. Diabetes Have regular diabetes screenings. This checks your fasting blood sugar level. Have the screening done:  Once every three years after age 58 if you are at a normal weight and have a low risk for diabetes.  More often and at a younger age if you are overweight or have a high risk for diabetes. What should I know about preventing infection? Hepatitis B If you have a higher risk for hepatitis B, you should be screened for this virus. Talk with your health care provider to find out if you are at risk for hepatitis B infection. Hepatitis C Testing is recommended for:  Everyone born from [birth year] through 1965.  Anyone with known risk factors for hepatitis C. Sexually transmitted infections (STIs)  Get screened for STIs,  including gonorrhea and chlamydia, if: ? You are sexually active and are younger than 49 years of age. ? You are older than 49 years of age and your health care provider tells you that you are at risk for this type of infection. ? Your sexual activity has changed since you were last screened, and you are at increased risk for chlamydia or gonorrhea. Ask your health care provider if you are at risk.  Ask your health care provider about whether you are at high  risk for HIV. Your health care provider may recommend a prescription medicine to help prevent HIV infection. If you choose to take medicine to prevent HIV, you should first get tested for HIV. You should then be tested every 3 months for as long as you are taking the medicine. Pregnancy  If you are about to stop having your period (premenopausal) and you may become pregnant, seek counseling before you get pregnant.  Take 400 to 800 micrograms (mcg) of folic acid every day if you become pregnant.  Ask for birth control (contraception) if you want to prevent pregnancy. Osteoporosis and menopause Osteoporosis is a disease in which the bones lose minerals and strength with aging. This can result in bone fractures. If you are 58 years old or older, or if you are at risk for osteoporosis and fractures, ask your health care provider if you should:  Be screened for bone loss.  Take a calcium or vitamin D supplement to lower your risk of fractures.  Be given hormone replacement therapy (HRT) to treat symptoms of menopause. Follow these instructions at home: Lifestyle  Do not use any products that contain nicotine or tobacco, such as cigarettes, e-cigarettes, and chewing tobacco. If you need help quitting, ask your health care provider.  Do not use street drugs.  Do not share needles.  Ask your health care provider for help if you need support or information about quitting drugs. Alcohol use  Do not drink alcohol if: ? Your health care provider tells you not to drink. ? You are pregnant, may be pregnant, or are planning to become pregnant.  If you drink alcohol: ? Limit how much you use to 0-1 drink a day. ? Limit intake if you are breastfeeding.  Be aware of how much alcohol is in your drink. In the [place] one drink equals one 12 oz bottle of beer (355 mL), one 5 oz glass of wine (148 mL), or one 1 oz glass of hard liquor (44 mL). General instructions  Schedule regular health, dental,  and eye exams.  Stay current with your vaccines.  Tell your health care provider if: ? You often feel depressed. ? You have ever been abused or do not feel safe at home. Summary  Adopting a healthy lifestyle and getting preventive care are important in promoting health and wellness.  Follow your health care provider's instructions about healthy diet, exercising, and getting tested or screened for diseases.  Follow your health care provider's instructions on monitoring your cholesterol and blood pressure. This information is not intended to replace advice given to you by your health care provider. Make sure you discuss any questions you have with your health care provider. Document Revised: 09/18/2018 Document Reviewed: 09/18/2018 Elsevier Patient Education  2020 [employer].

## 2020-07-14 NOTE — Progress Notes (Signed)
Subjective:    Patient ID: Bridget Peters, female    DOB: [birth year], 49 y.o.   MRN: [id number]  CC: Bridget Peters is a 49 y.o. female who presents today for physical exam and follow up.    HPI: Feels well today No complaints.  Fatigue has improved. Work remains busy, particulary this time of year at [employer] however she feels she is coping well.  HTN- compliant amlodipine.  No cp, sob.   DM- working on weight loss and very pleased with her progress over the last couple of years.  compliant invokana  HLD- declines statin at this time and prefer to work on diet  Hypothyroidism- compliant with synthroid.         Colorectal Cancer Screening: due Breast Cancer Screening: Mammogram UTD Cervical Cancer Screening: UTD. No pelvic pain.         Tetanus - due        Pneumococcal - Complete  Labs: Screening labs today. Exercise: Gets regular exercise, walking.   Alcohol use:  occassional Smoking/tobacco use: Nonsmoker.   No new skin lesions; would like to see dermatology annually as hasnt done so in a couple of years   HISTORY:  Past Medical History:  Diagnosis Date  . Allergy    Seasonal  . Asthma   . Complication of anesthesia    nausea and vomitting  . Depression   . Diabetes mellitus without complication (Bridget Peters)    Pt takes Metformin.  . Family history of adverse reaction to anesthesia    nausea and vomitting  . Hypertension   . Pneumonia   . Shortness of breath   . Sleep apnea   . Thyroid disease     Past Surgical History:  Procedure Laterality Date  . BREAST SURGERY  01/2013   Breast Reduction   . NASAL SINUS SURGERY    . NASAL SINUS SURGERY  02/2014  . REDUCTION MAMMAPLASTY Bilateral 2014  . WISDOM TOOTH EXTRACTION     x4   Family History  Problem Relation Age of Onset  . Hypertension Mother   . Endometrial cancer Mother 80  . Hypertension Father   . Colon polyps Father   . Cancer Maternal Aunt        Breast Cancer  . Breast cancer  Maternal Aunt   . Breast cancer Paternal Aunt        Breast Cancer  . Cancer Maternal Grandmother 36       Bladder Cancer  . Stroke Maternal Grandmother   . Breast cancer Maternal Grandmother 60  . Cancer Maternal Grandfather 15       Esophagus Cancer  . Colon cancer Neg Hx   . Ovarian cancer Neg Hx       ALLERGIES: Shellfish allergy, Iodinated diagnostic agents, Milk-related compounds, Tamiflu [oseltamivir phosphate], Wheat bran, Covid-19 (adenovirus) vaccine, Acetazolamide, and Xolair [omalizumab]  Current Outpatient Medications on File Prior to Visit  Medication Sig Dispense Refill  . albuterol (PROVENTIL) (2.5 MG/3ML) 0.083% nebulizer solution Take 3 mLs (2.5 mg total) by nebulization every 6 (six) hours as needed for wheezing or shortness of breath. 75 mL 12  . albuterol (VENTOLIN HFA) 108 (90 Base) MCG/ACT inhaler INHALE 2 PUFFS INTO THE LUNGS EVERY 4 HOURS AS NEEDED FOR WHEEZING OR SHORTNESS OF BREATH. 18 g 3  . amLODipine (NORVASC) 2.5 MG tablet TAKE 1 TABLET BY MOUTH DAILY. 90 tablet 3  . cetirizine (ZYRTEC ALLERGY) 10 MG tablet Take 1 tablet (10 mg total) by mouth  daily. 30 tablet 6  . EPIPEN 2-PAK 0.3 MG/0.3ML SOAJ injection   11  . fexofenadine (ALLEGRA) 180 MG tablet Take 1 tablet (180 mg total) by mouth daily. 30 tablet 0  . fluticasone (FLONASE) 50 MCG/ACT nasal spray Place 2 sprays into both nostrils daily. 16 g 6  . gabapentin (NEURONTIN) 100 MG capsule Take 1 capsule (100 mg total) by mouth at bedtime. 90 capsule 1  . levothyroxine (SYNTHROID) 75 MCG tablet TAKE 1 TABLET BY MOUTH ONCE DAILY 90 tablet 0  . meloxicam (MOBIC) 7.5 MG tablet Take 1 tablet (7.5 mg total) by mouth daily as needed for pain. 30 tablet 1  . montelukast (SINGULAIR) 10 MG tablet Take 1 tablet (10 mg total) by mouth daily. 90 tablet 3  . naproxen (NAPROSYN) 500 MG tablet Take 1 tablet (500 mg total) by mouth 2 (two) times daily with a meal. 30 tablet 0  . pantoprazole (PROTONIX) 40 MG tablet Take 1  tablet (40 mg total) by mouth daily. 90 tablet 3  . RESTASIS MULTIDOSE 0.05 % ophthalmic emulsion     . Tiotropium Bromide Monohydrate (SPIRIVA RESPIMAT) 1.25 MCG/ACT AERS Inhale 2 puffs into the lungs daily. 4 g 11  . topiramate (TOPAMAX) 100 MG tablet Take 1 tablet twice daily 180 tablet 3   Current Facility-Administered Medications on File Prior to Visit  Medication Dose Route Frequency Provider Last Rate Last Admin  . Mepolizumab SOLR 100 mg  100 mg Subcutaneous Q28 days Bridget Lipps, MD   100 mg at 08/03/17 2536    Social History   Tobacco Use  . Smoking status: Never Smoker  . Smokeless tobacco: Never Used  Substance Use Topics  . Alcohol use: Yes    Alcohol/week: 0.0 standard drinks  . Drug use: No    Review of Systems  Constitutional: Negative for chills, fatigue, fever and unexpected weight change.  HENT: Negative for congestion.   Respiratory: Negative for cough and shortness of breath.   Cardiovascular: Negative for chest pain, palpitations and leg swelling.  Gastrointestinal: Negative for nausea and vomiting.  Musculoskeletal: Negative for arthralgias and myalgias.  Skin: Negative for rash.  Neurological: Negative for headaches.  Hematological: Negative for adenopathy.  Psychiatric/Behavioral: Negative for confusion, sleep disturbance and suicidal ideas.      Objective:    BP 128/80   Pulse 83   Temp 98.3 F (36.8 C)   Ht 5\' 3"  (1.6 m)   Wt 144 lb 3.2 oz (65.4 kg)   SpO2 99%   BMI 25.54 kg/m   BP Readings from Last 3 Encounters:  07/14/20 128/80  05/10/20 132/78  05/10/20 130/86   Wt Readings from Last 3 Encounters:  07/14/20 144 lb 3.2 oz (65.4 kg)  05/10/20 150 lb 12.8 oz (68.4 kg)  05/10/20 150 lb (68 kg)    Physical Exam Vitals reviewed.  Constitutional:      Appearance: She is well-developed.  Eyes:     Conjunctiva/sclera: Conjunctivae normal.  Neck:     Thyroid: No thyroid mass or thyromegaly.  Cardiovascular:     Rate and Rhythm:  Normal rate and regular rhythm.     Pulses: Normal pulses.     Heart sounds: Normal heart sounds.  Pulmonary:     Effort: Pulmonary effort is normal.     Breath sounds: Normal breath sounds. No wheezing, rhonchi or rales.  Chest:     Breasts: Breasts are symmetrical.        Right: No inverted nipple, mass, nipple  discharge, skin change or tenderness.        Left: No inverted nipple, mass, nipple discharge, skin change or tenderness.  Lymphadenopathy:     Head:     Right side of head: No submental, submandibular, tonsillar, preauricular, posterior auricular or occipital adenopathy.     Left side of head: No submental, submandibular, tonsillar, preauricular, posterior auricular or occipital adenopathy.     Cervical: No cervical adenopathy.     Right cervical: No superficial, deep or posterior cervical adenopathy.    Left cervical: No superficial, deep or posterior cervical adenopathy.  Skin:    General: Skin is warm and dry.  Neurological:     Mental Status: She is alert.  Psychiatric:        Speech: Speech normal.        Behavior: Behavior normal.        Thought Content: Thought content normal.        Assessment & Plan:   Problem List Items Addressed This Visit      Cardiovascular and Mediastinum   HTN (hypertension)    Over all controlled. Would like her to be < 120/80 however as patient is actively, aggressively pursuing lifestyle changes, we opted to make no further changes.  Continue amlodipine 2.5mg         Endocrine   Diabetes mellitus type 2, controlled, without complications ([place])    Lab Results  Component Value Date   HGBA1C 6.1 05/10/2020   Impressive  Last a1c. Opted to trial stop invokana. Close follow up.       Hypothyroidism    Lab Results  Component Value Date   TSH 1.16 05/10/2020   Stable, continue synthroid 75 mcg.        Other   Routine physical examination - Primary    Deferred pelvic exam in the absence of complaints and pap is UTD.  Referral for colonoscopy. Congratulated patient on continued weight loss, exercise and encouraged her to achieve BMI < 25 , which she is ever so close.       Relevant Orders   Ambulatory referral to Gastroenterology       I have discontinued Bridget Peters's silver sulfADIAZINE, Invokana, Fluticasone Propionate, and benzonatate. I am also having her maintain her EpiPen 2-Pak, fexofenadine, cetirizine, albuterol, meloxicam, topiramate, amLODipine, Restasis MultiDose, gabapentin, pantoprazole, montelukast, albuterol, Spiriva Respimat, levothyroxine, naproxen, and fluticasone. We will continue to administer Mepolizumab.   No orders of the defined types were placed in this encounter.   Return precautions given.   Risks, benefits, and alternatives of the medications and treatment plan prescribed today were discussed, and patient expressed understanding.   Education regarding symptom management and diagnosis given to patient on AVS.   Continue to follow with Bridget Hawthorne, FNP for routine health maintenance.   Bridget Peters and I agreed with plan.   Mable Paris, FNP

## 2020-07-14 NOTE — Assessment & Plan Note (Signed)
Lab Results  Component Value Date   TSH 1.16 05/10/2020   Stable, continue synthroid 75 mcg.

## 2020-07-14 NOTE — Assessment & Plan Note (Signed)
Lab Results  Component Value Date   HGBA1C 6.1 05/10/2020   Impressive  Last a1c. Opted to trial stop invokana. Close follow up.

## 2020-07-14 NOTE — Assessment & Plan Note (Signed)
Deferred pelvic exam in the absence of complaints and pap is UTD. Referral for colonoscopy. Congratulated patient on continued weight loss, exercise and encouraged her to achieve BMI < 25 , which she is ever so close.

## 2020-07-15 DIAGNOSIS — J4550 Severe persistent asthma, uncomplicated: Secondary | ICD-10-CM | POA: Diagnosis not present

## 2020-07-26 ENCOUNTER — Encounter: Payer: Self-pay | Admitting: *Deleted

## 2020-07-26 ENCOUNTER — Telehealth: Payer: Self-pay | Admitting: Family

## 2020-07-26 NOTE — Telephone Encounter (Signed)
"  Rejection Reason - Patient did not respond" [facility] said on Jul 26, 2020 11:34 AM  Mailed "unable to contact" letter to home and referring provider.

## 2020-07-27 DIAGNOSIS — R091 Pleurisy: Secondary | ICD-10-CM | POA: Diagnosis not present

## 2020-07-27 DIAGNOSIS — J4550 Severe persistent asthma, uncomplicated: Secondary | ICD-10-CM | POA: Diagnosis not present

## 2020-07-27 DIAGNOSIS — J301 Allergic rhinitis due to pollen: Secondary | ICD-10-CM | POA: Diagnosis not present

## 2020-07-27 DIAGNOSIS — T50Z95A Adverse effect of other vaccines and biological substances, initial encounter: Secondary | ICD-10-CM | POA: Diagnosis not present

## 2020-07-27 DIAGNOSIS — J3081 Allergic rhinitis due to animal (cat) (dog) hair and dander: Secondary | ICD-10-CM | POA: Diagnosis not present

## 2020-07-27 DIAGNOSIS — H1013 Acute atopic conjunctivitis, bilateral: Secondary | ICD-10-CM | POA: Diagnosis not present

## 2020-07-27 DIAGNOSIS — Z91018 Allergy to other foods: Secondary | ICD-10-CM | POA: Diagnosis not present

## 2020-07-27 DIAGNOSIS — J3089 Other allergic rhinitis: Secondary | ICD-10-CM | POA: Diagnosis not present

## 2020-07-29 DIAGNOSIS — H1045 Other chronic allergic conjunctivitis: Secondary | ICD-10-CM | POA: Diagnosis not present

## 2020-07-29 DIAGNOSIS — H5203 Hypermetropia, bilateral: Secondary | ICD-10-CM | POA: Diagnosis not present

## 2020-07-29 DIAGNOSIS — D3132 Benign neoplasm of left choroid: Secondary | ICD-10-CM | POA: Diagnosis not present

## 2020-07-29 DIAGNOSIS — E119 Type 2 diabetes mellitus without complications: Secondary | ICD-10-CM | POA: Diagnosis not present

## 2020-07-29 DIAGNOSIS — H524 Presbyopia: Secondary | ICD-10-CM | POA: Diagnosis not present

## 2020-07-29 DIAGNOSIS — H04123 Dry eye syndrome of bilateral lacrimal glands: Secondary | ICD-10-CM | POA: Diagnosis not present

## 2020-07-29 DIAGNOSIS — Z7984 Long term (current) use of oral hypoglycemic drugs: Secondary | ICD-10-CM | POA: Diagnosis not present

## 2020-07-29 DIAGNOSIS — H52223 Regular astigmatism, bilateral: Secondary | ICD-10-CM | POA: Diagnosis not present

## 2020-07-29 DIAGNOSIS — G932 Benign intracranial hypertension: Secondary | ICD-10-CM | POA: Diagnosis not present

## 2020-07-29 LAB — HM DIABETES EYE EXAM

## 2020-08-04 ENCOUNTER — Telehealth (INDEPENDENT_AMBULATORY_CARE_PROVIDER_SITE_OTHER): Payer: Self-pay | Admitting: Gastroenterology

## 2020-08-04 ENCOUNTER — Other Ambulatory Visit: Payer: Self-pay

## 2020-08-04 DIAGNOSIS — Z1211 Encounter for screening for malignant neoplasm of colon: Secondary | ICD-10-CM

## 2020-08-04 MED ORDER — NA SULFATE-K SULFATE-MG SULF 17.5-3.13-1.6 GM/177ML PO SOLN: 1.0000 | Freq: Once | ORAL | 0 refills | Status: DC

## 2020-08-04 NOTE — Progress Notes (Signed)
Gastroenterology Pre-Procedure Review  Request Date: Friday 09/10/20 Requesting Physician: Dr. Allen Norris  PATIENT REVIEW QUESTIONS: The patient responded to the following health history questions as indicated:    1. Are you having any GI issues? yes (Patient states yes.  last colonoscopy was with Dr. Vira Agar about 5 years ago.) 2. Do you have a personal history of Polyps? no 3. Do you have a family history of Colon Cancer or Polyps? Father colon polyps 4. Diabetes Mellitus? Patient states she is no longer a diabetic 5. Joint replacements in the past 12 months?no 6. Major health problems in the past 3 months?no 7. Any artificial heart valves, MVP, or defibrillator?no    MEDICATIONS & ALLERGIES:    Patient reports the following regarding taking any anticoagulation/antiplatelet therapy:   Plavix, Coumadin, Eliquis, Xarelto, Lovenox, Pradaxa, Brilinta, or Effient? no Aspirin? no  Patient confirms/reports the following medications:  Current Outpatient Medications  Medication Sig Dispense Refill  . albuterol (PROVENTIL) (2.5 MG/3ML) 0.083% nebulizer solution Take 3 mLs (2.5 mg total) by nebulization every 6 (six) hours as needed for wheezing or shortness of breath. 75 mL 12  . amLODipine (NORVASC) 2.5 MG tablet TAKE 1 TABLET BY MOUTH DAILY. 90 tablet 3  . cetirizine (ZYRTEC ALLERGY) 10 MG tablet Take 1 tablet (10 mg total) by mouth daily. 30 tablet 6  . EPIPEN 2-PAK 0.3 MG/0.3ML SOAJ injection   11  . fexofenadine (ALLEGRA) 180 MG tablet Take 1 tablet (180 mg total) by mouth daily. 30 tablet 0  . fluticasone (FLONASE) 50 MCG/ACT nasal spray Place 2 sprays into both nostrils daily. 16 g 6  . gabapentin (NEURONTIN) 100 MG capsule Take 1 capsule (100 mg total) by mouth at bedtime. 90 capsule 1  . levothyroxine (SYNTHROID) 75 MCG tablet TAKE 1 TABLET BY MOUTH ONCE DAILY 90 tablet 0  . meloxicam (MOBIC) 7.5 MG tablet Take 1 tablet (7.5 mg total) by mouth daily as needed for pain. 30 tablet 1  .  montelukast (SINGULAIR) 10 MG tablet Take 1 tablet (10 mg total) by mouth daily. 90 tablet 3  . pantoprazole (PROTONIX) 40 MG tablet Take 1 tablet (40 mg total) by mouth daily. 90 tablet 3  . RESTASIS MULTIDOSE 0.05 % ophthalmic emulsion     . albuterol (VENTOLIN HFA) 108 (90 Base) MCG/ACT inhaler INHALE 2 PUFFS INTO THE LUNGS EVERY 4 HOURS AS NEEDED FOR WHEEZING OR SHORTNESS OF BREATH. (Patient not taking: Reported on 08/04/2020) 18 g 3  . naproxen (NAPROSYN) 500 MG tablet Take 1 tablet (500 mg total) by mouth 2 (two) times daily with a meal. (Patient not taking: Reported on 08/04/2020) 30 tablet 0  . Tiotropium Bromide Monohydrate (SPIRIVA RESPIMAT) 1.25 MCG/ACT AERS Inhale 2 puffs into the lungs daily. (Patient not taking: Reported on 08/04/2020) 4 g 11  . topiramate (TOPAMAX) 100 MG tablet Take 1 tablet twice daily (Patient not taking: Reported on 08/04/2020) 180 tablet 3   Current Facility-Administered Medications  Medication Dose Route Frequency Provider Last Rate Last Admin  . Mepolizumab SOLR 100 mg  100 mg Subcutaneous Q28 days Flora Lipps, MD   100 mg at 08/03/17 9678    Patient confirms/reports the following allergies:  Allergies  Allergen Reactions  . Shellfish Allergy Hives and Shortness Of Breath  . Iodinated Diagnostic Agents Hives    Pt developed hives approximately 6 hours after receiving Isovue 300 for CT Scan. She was given 50mG  oral Benadryl and symptoms improved  . Milk-Related Compounds Hives  . Tamiflu [Oseltamivir Phosphate]  Hives  . Wheat Bran Hives  . 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 Acetazolamide Rash  . Xolair [Omalizumab] Itching    No orders of the defined types were placed in this encounter.   AUTHORIZATION INFORMATION Primary Insurance: 1D#: Group #:  Secondary Insurance: 1D#: Group #:  SCHEDULE INFORMATION: Date: Friday 09/10/20 Time: Location:[facility]

## 2020-08-12 DIAGNOSIS — J4550 Severe persistent asthma, uncomplicated: Secondary | ICD-10-CM | POA: Diagnosis not present

## 2020-08-24 ENCOUNTER — Other Ambulatory Visit: Payer: Self-pay

## 2020-08-27 ENCOUNTER — Encounter: Payer: Self-pay | Admitting: Family

## 2020-08-27 ENCOUNTER — Other Ambulatory Visit: Payer: Self-pay

## 2020-08-27 ENCOUNTER — Ambulatory Visit (INDEPENDENT_AMBULATORY_CARE_PROVIDER_SITE_OTHER): Payer: 59 | Admitting: Family

## 2020-08-27 VITALS — BP 124/84 | HR 82 | Temp 97.6°F | Ht 63.0 in | Wt 142.4 lb

## 2020-08-27 DIAGNOSIS — I10 Essential (primary) hypertension: Secondary | ICD-10-CM

## 2020-08-27 DIAGNOSIS — E119 Type 2 diabetes mellitus without complications: Secondary | ICD-10-CM

## 2020-08-27 DIAGNOSIS — E7800 Pure hypercholesterolemia, unspecified: Secondary | ICD-10-CM | POA: Diagnosis not present

## 2020-08-27 LAB — LIPID PANEL
Cholesterol: 288 mg/dL — ABNORMAL HIGH (ref 0–200)
HDL: 86.4 mg/dL (ref >39.00)
LDL Cholesterol: 178 mg/dL — ABNORMAL HIGH (ref 0–99)
NonHDL: 201.54
Total CHOL/HDL Ratio: 3
Triglycerides: 117 mg/dL (ref 0.0–149.0)
VLDL: 23.4 mg/dL (ref 0.0–40.0)

## 2020-08-27 LAB — HEMOGLOBIN A1C: Hgb A1c MFr Bld: 5.8 % (ref 4.6–6.5)

## 2020-08-27 NOTE — Patient Instructions (Signed)
You are an amazing person Bridget Peters. I am rooting for you. Please let me know if you need anything at all.

## 2020-08-27 NOTE — Assessment & Plan Note (Signed)
Diet controlled. Pending a1c ?

## 2020-08-27 NOTE — Assessment & Plan Note (Signed)
Politely declines statin at this time. Pending lipid panel.

## 2020-08-27 NOTE — Assessment & Plan Note (Signed)
Elevated today. Patient will keep blood pressure log at home. Continue amlodipine 2.5mg .

## 2020-08-27 NOTE — Progress Notes (Signed)
Subjective:    Patient ID: Bridget Peters, female    DOB: Jan 06, 1971, 49 y.o.   MRN: [id number]  CC: Bridget Peters is a 49 y.o. female who presents today for follow up.   HPI: Feels blood pressure high due to ongoing stress at work. She didn't sleep well due to stress and knew blood pressure would be elevated today. Typically has good control and is compliant amlodipine No cp, sob. Not taking decongestants DM- no longer on invokana. She has been managing with diet and she is approaching her goal weight.   HLD- LDL 223 3 months ago. She would like to avoid statin if she can.     Colonoscopy is scheduled.   HISTORY:  Past Medical History:  Diagnosis Date  . Allergy    Seasonal  . Asthma   . Complication of anesthesia    nausea and vomitting  . Depression   . Diabetes mellitus without complication (Bridget Peters)    Pt takes Metformin.  . Family history of adverse reaction to anesthesia    nausea and vomitting  . Hypertension   . Pneumonia   . Shortness of breath   . Sleep apnea   . Thyroid disease    Past Surgical History:  Procedure Laterality Date  . BREAST SURGERY  01/2013   Breast Reduction   . NASAL SINUS SURGERY    . NASAL SINUS SURGERY  02/2014  . REDUCTION MAMMAPLASTY Bilateral 2014  . WISDOM TOOTH EXTRACTION     x4   Family History  Problem Relation Age of Onset  . Hypertension Mother   . Endometrial cancer Mother 75  . Hypertension Father   . Colon polyps Father   . Cancer Maternal Aunt        Breast Cancer  . Breast cancer Maternal Aunt   . Breast cancer Paternal Aunt        Breast Cancer  . Cancer Maternal Grandmother 41       Bladder Cancer  . Stroke Maternal Grandmother   . Breast cancer Maternal Grandmother 60  . Cancer Maternal Grandfather 18       Esophagus Cancer  . Colon cancer Neg Hx   . Ovarian cancer Neg Hx     Allergies: Shellfish allergy, Iodinated diagnostic agents, Milk-related compounds, Tamiflu [oseltamivir phosphate], Wheat  bran, Covid-19 (adenovirus) vaccine, Acetazolamide, and Xolair [omalizumab] Current Outpatient Medications on File Prior to Visit  Medication Sig Dispense Refill  . albuterol (PROVENTIL) (2.5 MG/3ML) 0.083% nebulizer solution Take 3 mLs (2.5 mg total) by nebulization every 6 (six) hours as needed for wheezing or shortness of breath. 75 mL 12  . albuterol (VENTOLIN HFA) 108 (90 Base) MCG/ACT inhaler INHALE 2 PUFFS INTO THE LUNGS EVERY 4 HOURS AS NEEDED FOR WHEEZING OR SHORTNESS OF BREATH. 18 g 3  . amLODipine (NORVASC) 2.5 MG tablet TAKE 1 TABLET BY MOUTH DAILY. 90 tablet 3  . cetirizine (ZYRTEC ALLERGY) 10 MG tablet Take 1 tablet (10 mg total) by mouth daily. 30 tablet 6  . EPIPEN 2-PAK 0.3 MG/0.3ML SOAJ injection   11  . fexofenadine (ALLEGRA) 180 MG tablet Take 1 tablet (180 mg total) by mouth daily. 30 tablet 0  . fluticasone (FLONASE) 50 MCG/ACT nasal spray Place 2 sprays into both nostrils daily. 16 g 6  . levothyroxine (SYNTHROID) 75 MCG tablet TAKE 1 TABLET BY MOUTH ONCE DAILY 90 tablet 0  . montelukast (SINGULAIR) 10 MG tablet Take 1 tablet (10 mg total) by mouth daily. [place]  tablet 3  . naproxen (NAPROSYN) 500 MG tablet Take 1 tablet (500 mg total) by mouth 2 (two) times daily with a meal. 30 tablet 0  . pantoprazole (PROTONIX) 40 MG tablet Take 1 tablet (40 mg total) by mouth daily. 90 tablet 3  . RESTASIS MULTIDOSE 0.05 % ophthalmic emulsion     . Tiotropium Bromide Monohydrate (SPIRIVA RESPIMAT) 1.25 MCG/ACT AERS Inhale 2 puffs into the lungs daily. 4 g 11  . topiramate (TOPAMAX) 100 MG tablet Take 1 tablet twice daily 180 tablet 3  . meloxicam (MOBIC) 7.5 MG tablet Take 1 tablet (7.5 mg total) by mouth daily as needed for pain. 30 tablet 1   Current Facility-Administered Medications on File Prior to Visit  Medication Dose Route Frequency Provider Last Rate Last Admin  . Mepolizumab SOLR 100 mg  100 mg Subcutaneous Q28 days Flora Lipps, MD   100 mg at 08/03/17 3151    Social History    Tobacco Use  . Smoking status: Never Smoker  . Smokeless tobacco: Never Used  Substance Use Topics  . Alcohol use: Yes    Alcohol/week: 0.0 standard drinks  . Drug use: No    Review of Systems  Constitutional: Negative for chills and fever.  Respiratory: Negative for cough.   Cardiovascular: Negative for chest pain and palpitations.  Gastrointestinal: Negative for nausea and vomiting.      Objective:    BP 124/84   Pulse 82   Temp 97.6 F (36.4 C)   Ht 5\' 3"  (1.6 m)   Wt 142 lb 6.4 oz (64.6 kg)   SpO2 99%   BMI 25.23 kg/m  BP Readings from Last 3 Encounters:  08/27/20 124/84  07/14/20 128/80  05/10/20 132/78   Wt Readings from Last 3 Encounters:  08/27/20 142 lb 6.4 oz (64.6 kg)  07/14/20 144 lb 3.2 oz (65.4 kg)  05/10/20 150 lb 12.8 oz (68.4 kg)    Physical Exam     Assessment & Plan:   Problem List Items Addressed This Visit      Cardiovascular and Mediastinum   HTN (hypertension) - Primary    Elevated today. Patient will keep blood pressure log at home. Continue amlodipine 2.5mg .         Endocrine   Diabetes mellitus type 2, controlled, without complications ([place])    Diet controlled. Pending a1c .      Relevant Orders   Lipid panel   Hemoglobin A1c     Other   Hypercholesteremia    Politely declines statin at this time. Pending lipid panel.           I have discontinued Bridget Peters's gabapentin. I am also having her maintain her EpiPen 2-Pak, fexofenadine, cetirizine, albuterol, meloxicam, topiramate, amLODipine, Restasis MultiDose, pantoprazole, montelukast, albuterol, Spiriva Respimat, levothyroxine, naproxen, and fluticasone. We will continue to administer Mepolizumab.   No orders of the defined types were placed in this encounter.   Return precautions given.   Risks, benefits, and alternatives of the medications and treatment plan prescribed today were discussed, and patient expressed understanding.   Education  regarding symptom management and diagnosis given to patient on AVS.  Continue to follow with Bridget Hawthorne, FNP for routine health maintenance.   Bridget Peters and I agreed with plan.   Bridget Paris, FNP

## 2020-09-06 ENCOUNTER — Encounter: Payer: Self-pay | Admitting: Gastroenterology

## 2020-09-08 ENCOUNTER — Other Ambulatory Visit: Payer: Self-pay

## 2020-09-08 ENCOUNTER — Ambulatory Visit: Payer: 59 | Admitting: Dermatology

## 2020-09-08 ENCOUNTER — Other Ambulatory Visit
Admission: RE | Admit: 2020-09-08 | Discharge: 2020-09-08 | Disposition: A | Discharge status: Home / Self Care | Payer: 59 | Source: Ambulatory Visit | Attending: Gastroenterology | Admitting: Gastroenterology

## 2020-09-08 DIAGNOSIS — Z01812 Encounter for preprocedural laboratory examination: Secondary | ICD-10-CM | POA: Insufficient documentation

## 2020-09-08 DIAGNOSIS — Z20822 Contact with and (suspected) exposure to covid-19: Secondary | ICD-10-CM | POA: Diagnosis not present

## 2020-09-08 LAB — SARS CORONAVIRUS 2 (TAT 6-24 HRS): SARS Coronavirus 2: NEGATIVE

## 2020-09-09 ENCOUNTER — Other Ambulatory Visit: Payer: Self-pay | Admitting: Family

## 2020-09-09 DIAGNOSIS — J4550 Severe persistent asthma, uncomplicated: Secondary | ICD-10-CM | POA: Diagnosis not present

## 2020-09-09 DIAGNOSIS — E039 Hypothyroidism, unspecified: Secondary | ICD-10-CM

## 2020-09-09 NOTE — Discharge Instructions (Signed)
General Anesthesia, Adult, Care After This sheet gives you information about how to care for yourself after your procedure. Your health care provider may also give you more specific instructions. If you have problems or questions, contact your health care provider. What can I expect after the procedure? After the procedure, the following side effects are common:  Pain or discomfort at the IV site.  Nausea.  Vomiting.  Sore throat.  Trouble concentrating.  Feeling cold or chills.  Weak or tired.  Sleepiness and fatigue.  Soreness and body aches. These side effects can affect parts of the body that were not involved in surgery. Follow these instructions at home:  For at least 24 hours after the procedure:  Have a responsible adult stay with you. It is important to have someone help care for you until you are awake and alert.  Rest as needed.  Do not: ? Participate in activities in which you could fall or become injured. ? Drive. ? Use heavy machinery. ? Drink alcohol. ? Take sleeping pills or medicines that cause drowsiness. ? Make important decisions or sign legal documents. ? Take care of children on your own. Eating and drinking  Follow any instructions from your health care provider about eating or drinking restrictions.  When you feel hungry, start by eating small amounts of foods that are soft and easy to digest (bland), such as toast. Gradually return to your regular diet.  Drink enough fluid to keep your urine pale yellow.  If you vomit, rehydrate by drinking water, juice, or clear broth. General instructions  If you have sleep apnea, surgery and certain medicines can increase your risk for breathing problems. Follow instructions from your health care provider about wearing your sleep device: ? Anytime you are sleeping, including during daytime naps. ? While taking prescription pain medicines, sleeping medicines, or medicines that make you drowsy.  Return to  your normal activities as told by your health care provider. Ask your health care provider what activities are safe for you.  Take over-the-counter and prescription medicines only as told by your health care provider.  If you smoke, do not smoke without supervision.  Keep all follow-up visits as told by your health care provider. This is important. Contact a health care provider if:  You have nausea or vomiting that does not get better with medicine.  You cannot eat or drink without vomiting.  You have pain that does not get better with medicine.  You are unable to pass urine.  You develop a skin rash.  You have a fever.  You have redness around your IV site that gets worse. Get help right away if:  You have difficulty breathing.  You have chest pain.  You have blood in your urine or stool, or you vomit blood. Summary  After the procedure, it is common to have a sore throat or nausea. It is also common to feel tired.  Have a responsible adult stay with you for the first 24 hours after general anesthesia. It is important to have someone help care for you until you are awake and alert.  When you feel hungry, start by eating small amounts of foods that are soft and easy to digest (bland), such as toast. Gradually return to your regular diet.  Drink enough fluid to keep your urine pale yellow.  Return to your normal activities as told by your health care provider. Ask your health care provider what activities are safe for you. This information is not   intended to replace advice given to you by your health care provider. Make sure you discuss any questions you have with your health care provider. Document Revised: 09/28/2017 Document Reviewed: 05/11/2017 Elsevier Patient Education  2020 [employer]  

## 2020-09-10 ENCOUNTER — Encounter
Admission: RE | Disposition: A | Discharge status: Home / Self Care | Payer: Self-pay | Source: Home / Self Care | Attending: Gastroenterology

## 2020-09-10 ENCOUNTER — Ambulatory Visit
Admission: RE | Admit: 2020-09-10 | Discharge: 2020-09-10 | Disposition: A | Discharge status: Home / Self Care | Payer: 59 | Attending: Gastroenterology | Admitting: Gastroenterology

## 2020-09-10 ENCOUNTER — Ambulatory Visit: Payer: 59 | Admitting: Anesthesiology

## 2020-09-10 ENCOUNTER — Encounter: Payer: Self-pay | Admitting: Gastroenterology

## 2020-09-10 ENCOUNTER — Other Ambulatory Visit: Payer: Self-pay

## 2020-09-10 DIAGNOSIS — Z887 Allergy status to serum and vaccine status: Secondary | ICD-10-CM | POA: Insufficient documentation

## 2020-09-10 DIAGNOSIS — Z79899 Other long term (current) drug therapy: Secondary | ICD-10-CM | POA: Insufficient documentation

## 2020-09-10 DIAGNOSIS — E119 Type 2 diabetes mellitus without complications: Secondary | ICD-10-CM | POA: Diagnosis not present

## 2020-09-10 DIAGNOSIS — Z860100 Personal history of colon polyps, unspecified: Secondary | ICD-10-CM

## 2020-09-10 DIAGNOSIS — Z91013 Allergy to seafood: Secondary | ICD-10-CM | POA: Diagnosis not present

## 2020-09-10 DIAGNOSIS — Z91018 Allergy to other foods: Secondary | ICD-10-CM | POA: Diagnosis not present

## 2020-09-10 DIAGNOSIS — K640 First degree hemorrhoids: Secondary | ICD-10-CM | POA: Diagnosis not present

## 2020-09-10 DIAGNOSIS — Z7951 Long term (current) use of inhaled steroids: Secondary | ICD-10-CM | POA: Insufficient documentation

## 2020-09-10 DIAGNOSIS — Z823 Family history of stroke: Secondary | ICD-10-CM | POA: Insufficient documentation

## 2020-09-10 DIAGNOSIS — Z8049 Family history of malignant neoplasm of other genital organs: Secondary | ICD-10-CM | POA: Insufficient documentation

## 2020-09-10 DIAGNOSIS — I10 Essential (primary) hypertension: Secondary | ICD-10-CM | POA: Insufficient documentation

## 2020-09-10 DIAGNOSIS — Z800 Family history of malignant neoplasm of digestive organs: Secondary | ICD-10-CM | POA: Diagnosis not present

## 2020-09-10 DIAGNOSIS — E039 Hypothyroidism, unspecified: Secondary | ICD-10-CM | POA: Diagnosis not present

## 2020-09-10 DIAGNOSIS — Z8371 Family history of colonic polyps: Secondary | ICD-10-CM | POA: Diagnosis not present

## 2020-09-10 DIAGNOSIS — Z91041 Radiographic dye allergy status: Secondary | ICD-10-CM | POA: Insufficient documentation

## 2020-09-10 DIAGNOSIS — K5730 Diverticulosis of large intestine without perforation or abscess without bleeding: Secondary | ICD-10-CM | POA: Diagnosis not present

## 2020-09-10 DIAGNOSIS — J45909 Unspecified asthma, uncomplicated: Secondary | ICD-10-CM | POA: Diagnosis not present

## 2020-09-10 DIAGNOSIS — Z91011 Allergy to milk products: Secondary | ICD-10-CM | POA: Diagnosis not present

## 2020-09-10 DIAGNOSIS — Z1211 Encounter for screening for malignant neoplasm of colon: Secondary | ICD-10-CM | POA: Insufficient documentation

## 2020-09-10 DIAGNOSIS — Z803 Family history of malignant neoplasm of breast: Secondary | ICD-10-CM | POA: Diagnosis not present

## 2020-09-10 DIAGNOSIS — G4730 Sleep apnea, unspecified: Secondary | ICD-10-CM | POA: Diagnosis not present

## 2020-09-10 DIAGNOSIS — Z8249 Family history of ischemic heart disease and other diseases of the circulatory system: Secondary | ICD-10-CM | POA: Insufficient documentation

## 2020-09-10 DIAGNOSIS — Z888 Allergy status to other drugs, medicaments and biological substances status: Secondary | ICD-10-CM | POA: Insufficient documentation

## 2020-09-10 HISTORY — DX: Other specified postprocedural states: Z98.890

## 2020-09-10 HISTORY — PX: COLONOSCOPY WITH PROPOFOL: SHX5780

## 2020-09-10 HISTORY — DX: Hypothyroidism, unspecified: E03.9

## 2020-09-10 HISTORY — DX: Nausea with vomiting, unspecified: R11.2

## 2020-09-10 HISTORY — DX: Other specified postprocedural states: R11.2

## 2020-09-10 SURGERY — COLONOSCOPY WITH PROPOFOL
Anesthesia: General | Site: Rectum

## 2020-09-10 MED ORDER — ACETAMINOPHEN 325 MG PO TABS: 325.0000 mg | ORAL_TABLET | ORAL | Status: DC | PRN

## 2020-09-10 MED ORDER — GLYCOPYRROLATE 0.2 MG/ML IJ SOLN
INTRAMUSCULAR | Status: DC | PRN
  Administered 2020-09-10: .2 mg via INTRAVENOUS

## 2020-09-10 MED ORDER — PROPOFOL 10 MG/ML IV BOLUS
INTRAVENOUS | Status: DC | PRN
  Administered 2020-09-10 (×2): 30 mg via INTRAVENOUS
  Administered 2020-09-10: 40 mg via INTRAVENOUS
  Administered 2020-09-10 (×2): 30 mg via INTRAVENOUS
  Administered 2020-09-10: 150 mg via INTRAVENOUS

## 2020-09-10 MED ORDER — LIDOCAINE HCL (CARDIAC) PF 100 MG/5ML IV SOSY
PREFILLED_SYRINGE | INTRAVENOUS | Status: DC | PRN
  Administered 2020-09-10: 50 mg via INTRAVENOUS

## 2020-09-10 MED ORDER — ONDANSETRON HCL 4 MG/2ML IJ SOLN: 4.0000 mg | Freq: Once | INTRAMUSCULAR | Status: DC | PRN

## 2020-09-10 MED ORDER — STERILE WATER FOR IRRIGATION IR SOLN: Status: DC | PRN

## 2020-09-10 MED ORDER — ACETAMINOPHEN 160 MG/5ML PO SOLN: 325.0000 mg | ORAL | Status: DC | PRN

## 2020-09-10 MED ORDER — SODIUM CHLORIDE 0.9 % IV SOLN: INTRAVENOUS | Status: DC

## 2020-09-10 MED ORDER — LACTATED RINGERS IV SOLN: INTRAVENOUS | Status: DC

## 2020-09-10 SURGICAL SUPPLY — 6 items
GOWN CVR UNV OPN BCK APRN NK (MISCELLANEOUS) ×2 IMPLANT
GOWN ISOL THUMB LOOP REG UNIV (MISCELLANEOUS) ×4
KIT PRC NS LF DISP ENDO (KITS) ×1 IMPLANT
KIT PROCEDURE OLYMPUS (KITS) ×2
MANIFOLD NEPTUNE II (INSTRUMENTS) ×2 IMPLANT
WATER STERILE IRR 250ML POUR (IV SOLUTION) ×2 IMPLANT

## 2020-09-10 NOTE — Anesthesia Postprocedure Evaluation (Signed)
Anesthesia Post Note  Patient: Bridget Peters  Procedure(s) Performed: COLONOSCOPY WITH PROPOFOL (N/A Rectum)     Patient location during evaluation: PACU Anesthesia Type: General Level of consciousness: awake Pain management: pain level controlled Vital Signs Assessment: post-procedure vital signs reviewed and stable Respiratory status: respiratory function stable Cardiovascular status: stable Postop Assessment: no signs of nausea or vomiting Anesthetic complications: no   No complications documented.   Brief vagal episode in PACU with HR drop to 20s. Treated with fluids and one dose of glycopyrrolate with resolution of HR to 50s. No drop in BP. Patient alert and oriented and stable at time of discharge.  Veda Canning

## 2020-09-10 NOTE — Transfer of Care (Signed)
Immediate Anesthesia Transfer of Care Note  Patient: Bridget Peters  Procedure(s) Performed: COLONOSCOPY WITH PROPOFOL (N/A Rectum)  Patient Location: PACU  Anesthesia Type: General  Level of Consciousness: awake, alert  and patient cooperative  Airway and Oxygen Therapy: Patient Spontanous Breathing and Patient connected to supplemental oxygen  Post-op Assessment: Post-op Vital signs reviewed, Patient's Cardiovascular Status Stable, Respiratory Function Stable, Patent Airway and No signs of Nausea or vomiting  Post-op Vital Signs: Reviewed and stable  Complications: No complications documented.

## 2020-09-10 NOTE — Anesthesia Preprocedure Evaluation (Signed)
Anesthesia Evaluation  Patient identified by MRN, date of birth, ID band Patient awake    Reviewed: Allergy & Precautions, NPO status   History of Anesthesia Complications (+) PONV  Airway Mallampati: II  TM Distance: >3 FB     Dental   Pulmonary asthma , sleep apnea ,    breath sounds clear to auscultation       Cardiovascular hypertension,  Rhythm:Regular Rate:Normal     Neuro/Psych  Headaches, PSYCHIATRIC DISORDERS Anxiety Depression    GI/Hepatic   Endo/Other  diabetes, Type 2Hypothyroidism   Renal/GU      Musculoskeletal   Abdominal   Peds  Hematology   Anesthesia Other Findings   Reproductive/Obstetrics                             Anesthesia Physical Anesthesia Plan  ASA: III  Anesthesia Plan: General   Post-op Pain Management:    Induction: Intravenous  PONV Risk Score and Plan: Propofol infusion, TIVA and Treatment may vary due to age or medical condition  Airway Management Planned: Natural Airway and Nasal Cannula  Additional Equipment:   Intra-op Plan:   Post-operative Plan:   Informed Consent: I have reviewed the patients History and Physical, chart, labs and discussed the procedure including the risks, benefits and alternatives for the proposed anesthesia with the patient or authorized representative who has indicated his/her understanding and acceptance.       Plan Discussed with: CRNA  Anesthesia Plan Comments:         Anesthesia Quick Evaluation

## 2020-09-10 NOTE — Anesthesia Procedure Notes (Signed)
Date/Time: 09/10/2020 10:08 AM Performed by: Cameron Ali, CRNA Pre-anesthesia Checklist: Patient identified, Emergency Drugs available, Suction available, Timeout performed and Patient being monitored Patient Re-evaluated:Patient Re-evaluated prior to induction Oxygen Delivery Method: Nasal cannula Placement Confirmation: positive ETCO2

## 2020-09-10 NOTE — Op Note (Signed)
[facility] Patient Name: Bridget Peters Procedure Date: 09/10/2020 10:05 AM MRN: [id number] Account #: [id number] Date of Birth: 04/12/1971 Admit Type: Outpatient Age: 49 Room: [facility] OR ROOM 01 Gender: Female Note Status: Finalized Procedure:             Colonoscopy Indications:           High risk colon cancer surveillance: Personal history                         of colonic polyps Providers:             Lucilla Lame MD, MD Referring MD:          Yvetta Coder. Arnett (Referring MD) Medicines:             Propofol per Anesthesia Complications:         No immediate complications. Procedure:             Pre-Anesthesia Assessment:                        - Prior to the procedure, a History and Physical was                         performed, and patient medications and allergies were                         reviewed. The patient's tolerance of previous                         anesthesia was also reviewed. The risks and benefits                         of the procedure and the sedation options and risks                         were discussed with the patient. All questions were                         answered, and informed consent was obtained. Prior                         Anticoagulants: The patient has taken no previous                         anticoagulant or antiplatelet agents. ASA Grade                         Assessment: II - A patient with mild systemic disease.                         After reviewing the risks and benefits, the patient                         was deemed in satisfactory condition to undergo the                         procedure.  After obtaining informed consent, the colonoscope was                         passed under direct vision. Throughout the procedure,                         the patient's blood pressure, pulse, and oxygen                         saturations were monitored continuously. The was                          introduced through the anus and advanced to the the                         cecum, identified by appendiceal orifice and ileocecal                         valve. The colonoscopy was performed without                         difficulty. The patient tolerated the procedure well.                         The quality of the bowel preparation was excellent. Findings:      The perianal and digital rectal examinations were normal.      Multiple small-mouthed diverticula were found in the sigmoid colon and       descending colon.      Non-bleeding internal hemorrhoids were found during retroflexion. The       hemorrhoids were Grade I (internal hemorrhoids that do not prolapse). Impression:            - Diverticulosis in the sigmoid colon and in the                         descending colon.                        - Non-bleeding internal hemorrhoids.                        - No specimens collected. Recommendation:        - Discharge patient to home.                        - Resume previous diet.                        - Continue present medications.                        - Repeat colonoscopy in 5 years for surveillance. Procedure Code(s):     --- Professional ---                        [phone], Colonoscopy, flexible; diagnostic, including                         collection of specimen(s) by brushing or washing, when  performed (separate procedure) Diagnosis Code(s):     --- Professional ---                        Z86.010, Personal history of colonic polyps CPT copyright 2019 American Medical Association. All rights reserved. The codes documented in this report are preliminary and upon coder review may  be revised to meet current compliance requirements. Lucilla Lame MD, MD 09/10/2020 10:26:38 AM This report has been signed electronically. Number of Addenda: 0 Note Initiated On: 09/10/2020 10:05 AM Scope Withdrawal Time: 0 hours 7 minutes 54 seconds  Total  Procedure Duration: 0 hours 11 minutes 5 seconds  Estimated Blood Loss:  Estimated blood loss: none.      [facility]

## 2020-09-10 NOTE — H&P (Signed)
Lucilla Lame, MD [address] [place], [place] [address] Phone:[phone] Fax : [phone]  Primary Care Physician:  Burnard Hawthorne, FNP Primary Gastroenterologist:  Dr. Allen Norris  Pre-Procedure History & Physical: HPI:  Bridget Peters is a 49 y.o. female is here for an colonoscopy.   Past Medical History:  Diagnosis Date  . Allergy    Seasonal  . Asthma   . Complication of anesthesia    nausea and vomitting  . Depression   . Diabetes mellitus without complication ([place])   . Family history of adverse reaction to anesthesia    nausea and vomitting  . Hypertension   . Hypothyroidism   . Pneumonia   . PONV (postoperative nausea and vomiting)   . Shortness of breath   . Sleep apnea   . Thyroid disease     Past Surgical History:  Procedure Laterality Date  . BREAST SURGERY  01/2013   Breast Reduction   . NASAL SINUS SURGERY    . NASAL SINUS SURGERY  02/2014  . REDUCTION MAMMAPLASTY Bilateral 2014  . WISDOM TOOTH EXTRACTION     x4    Prior to Admission medications   Medication Sig Start Date End Date Taking? Authorizing Provider  albuterol (VENTOLIN HFA) 108 (90 Base) MCG/ACT inhaler INHALE 2 PUFFS INTO THE LUNGS EVERY 4 HOURS AS NEEDED FOR WHEEZING OR SHORTNESS OF BREATH. 05/10/20  Yes Martyn Ehrich, NP  amLODipine (NORVASC) 2.5 MG tablet TAKE 1 TABLET BY MOUTH DAILY. 04/13/20  Yes Arnett, Yvetta Coder, FNP  fexofenadine (ALLEGRA) 180 MG tablet Take 1 tablet (180 mg total) by mouth daily. 12/18/17  Yes Kasa, Maretta Bees, MD  fluticasone (FLONASE) 50 MCG/ACT nasal spray Place 2 sprays into both nostrils daily. 06/06/20  Yes McVey, Gelene Mink, PA-C  levothyroxine (SYNTHROID) 75 MCG tablet TAKE 1 TABLET BY MOUTH ONCE DAILY 09/09/20  Yes Burnard Hawthorne, FNP  meloxicam (MOBIC) 7.5 MG tablet Take 1 tablet (7.5 mg total) by mouth daily as needed for pain. 10/22/19  Yes Arnett, Yvetta Coder, FNP  montelukast (SINGULAIR) 10 MG tablet Take 1 tablet (10 mg  total) by mouth daily. 05/10/20  Yes Martyn Ehrich, NP  naproxen (NAPROSYN) 500 MG tablet Take 1 tablet (500 mg total) by mouth 2 (two) times daily with a meal. 06/06/20  Yes McVey, Gelene Mink, PA-C  pantoprazole (PROTONIX) 40 MG tablet Take 1 tablet (40 mg total) by mouth daily. 05/10/20  Yes Martyn Ehrich, NP  RESTASIS MULTIDOSE 0.05 % ophthalmic emulsion  04/13/20  Yes [provider]  Tiotropium Bromide Monohydrate (SPIRIVA RESPIMAT) 1.25 MCG/ACT AERS Inhale 2 puffs into the lungs daily. 05/17/20  Yes Martyn Ehrich, NP  topiramate (TOPAMAX) 100 MG tablet Take 1 tablet twice daily 01/27/20  Yes Sater, Nanine Means, MD  albuterol (PROVENTIL) (2.5 MG/3ML) 0.083% nebulizer solution Take 3 mLs (2.5 mg total) by nebulization every 6 (six) hours as needed for wheezing or shortness of breath. 11/01/18   Withrow, Elyse Jarvis, FNP  cetirizine (ZYRTEC ALLERGY) 10 MG tablet Take 1 tablet (10 mg total) by mouth daily. 02/19/18   Flora Lipps, MD  EPIPEN 2-PAK 0.3 MG/0.3ML SOAJ injection  05/04/17   [provider]    Allergies as of 08/04/2020 - Review Complete 08/04/2020  Allergen Reaction Noted  . Shellfish allergy Hives and Shortness Of Breath 07/07/2015  . Iodinated diagnostic agents Hives 11/10/2019  . Milk-related compounds Hives 03/09/2017  . Tamiflu [oseltamivir phosphate] Hives 01/06/2019  . Wheat  bran Hives 03/09/2017  . Covid-19 (adenovirus) vaccine  10/22/2019  . Acetazolamide Rash 11/20/2019  . Xolair [omalizumab] Itching 06/13/2016    Family History  Problem Relation Age of Onset  . Hypertension Mother   . Endometrial cancer Mother 77  . Hypertension Father   . Colon polyps Father   . Cancer Maternal Aunt        Breast Cancer  . Breast cancer Maternal Aunt   . Breast cancer Paternal Aunt        Breast Cancer  . Cancer Maternal Grandmother 79       Bladder Cancer  . Stroke Maternal Grandmother   . Breast cancer Maternal Grandmother 60  . Cancer Maternal  Grandfather 30       Esophagus Cancer  . Colon cancer Neg Hx   . Ovarian cancer Neg Hx     Social History   Socioeconomic History  . Marital status: Married    Spouse name: Clare Gandy  . Number of children: Not on file  . Years of education: 52  . Highest education level: Not on file  Occupational History  . 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 Smoking status: Never Smoker  . Smokeless tobacco: Never Used  Substance and Sexual Activity  . Alcohol use: Yes    Alcohol/week: 0.0 standard drinks    Comment: occasionally  . Drug use: No  . Sexual activity: Never    Partners: Male    Birth control/protection: Post-menopausal  Other Topics Concern  . Not on file  Social History Narrative   Married   Garment/textile technologist    Children- None, 2 step-sons    1 [place] terriers    Caffeine- soda 2-3 cans, no coffee/tea   Moved 03/2015 from [place], [place]. [employer])   Right handed    Social Determinants of Health   Financial Resource Strain:   . Difficulty of Paying Living Expenses: Not on file  Food Insecurity:   . Worried About Charity fundraiser in the Last Year: Not on file  . Ran Out of Food in the Last Year: Not on file  Transportation Needs:   . Lack of Transportation (Medical): Not on file  . Lack of Transportation (Non-Medical): Not on file  Physical Activity:   . Days of Exercise per Week: Not on file  . Minutes of Exercise per Session: Not on file  Stress:   . Feeling of Stress : Not on file  Social Connections:   . Frequency of Communication with Friends and Family: Not on file  . Frequency of Social Gatherings with Friends and Family: Not on file  . Attends Religious Services: Not on file  . Active Member of Clubs or Organizations: Not on file  . Attends Archivist Meetings: Not on file  . Marital Status: Not on file  Intimate Partner Violence:   . Fear of Current or Ex-Partner: Not on file  . Emotionally Abused:  Not on file  . Physically Abused: Not on file  . Sexually Abused: Not on file    Review of Systems: See HPI, otherwise negative ROS  Physical Exam: BP 138/64   Pulse 65   Temp 97.6 F (36.4 C) (Temporal)   Resp 16   Ht 5\' 2"  (1.575 m)   Wt 65.3 kg   SpO2 100%   BMI 26.34 kg/m  General:   Alert,  pleasant and cooperative in NAD Head:  Normocephalic and atraumatic. Neck:  Supple;  no masses or thyromegaly. Lungs:  Clear throughout to auscultation.    Heart:  Regular rate and rhythm. Abdomen:  Soft, nontender and nondistended. Normal bowel sounds, without guarding, and without rebound.   Neurologic:  Alert and  oriented x4;  grossly normal neurologically.  Impression/Plan: Bridget Peters is here for an colonoscopy to be performed for a history of adenomatous polyps in 2018  Risks, benefits, limitations, and alternatives regarding  colonoscopy have been reviewed with the patient.  Questions have been answered.  All parties agreeable.   Lucilla Lame, MD  09/10/2020, 9:10 AM

## 2020-09-13 ENCOUNTER — Encounter: Payer: Self-pay | Admitting: Gastroenterology

## 2020-10-13 ENCOUNTER — Encounter: Payer: Self-pay | Admitting: Family

## 2020-10-14 ENCOUNTER — Other Ambulatory Visit: Payer: Self-pay | Admitting: Family

## 2020-10-14 MED ORDER — SERTRALINE HCL 50 MG PO TABS: 50.0000 mg | ORAL_TABLET | Freq: Every day | ORAL | 3 refills | Status: DC

## 2020-10-20 DIAGNOSIS — J4550 Severe persistent asthma, uncomplicated: Secondary | ICD-10-CM | POA: Diagnosis not present

## 2020-11-10 ENCOUNTER — Other Ambulatory Visit (HOSPITAL_COMMUNITY): Payer: Self-pay | Admitting: Allergy and Immunology

## 2020-12-13 ENCOUNTER — Other Ambulatory Visit: Payer: Self-pay

## 2020-12-13 ENCOUNTER — Encounter: Payer: Self-pay | Admitting: Family

## 2020-12-13 DIAGNOSIS — E039 Hypothyroidism, unspecified: Secondary | ICD-10-CM

## 2020-12-13 MED ORDER — LEVOTHYROXINE SODIUM 75 MCG PO TABS: 75.0000 ug | ORAL_TABLET | Freq: Every day | ORAL | 0 refills | Status: DC

## 2020-12-14 ENCOUNTER — Other Ambulatory Visit: Payer: Self-pay | Admitting: Family

## 2020-12-14 DIAGNOSIS — M542 Cervicalgia: Secondary | ICD-10-CM

## 2020-12-14 MED ORDER — GABAPENTIN 100 MG PO CAPS: 100.0000 mg | ORAL_CAPSULE | Freq: Every day | ORAL | 3 refills | Status: DC

## 2021-01-11 ENCOUNTER — Other Ambulatory Visit: Payer: Self-pay

## 2021-01-19 ENCOUNTER — Other Ambulatory Visit: Payer: Self-pay

## 2021-02-07 ENCOUNTER — Telehealth: Payer: 59 | Admitting: Family

## 2021-02-07 DIAGNOSIS — L237 Allergic contact dermatitis due to plants, except food: Secondary | ICD-10-CM

## 2021-02-07 MED ORDER — PREDNISONE 10 MG PO TABS: ORAL_TABLET | ORAL | 0 refills | Status: DC

## 2021-02-07 MED ORDER — TRIAMCINOLONE ACETONIDE 0.5 % EX OINT: 1.0000 "application " | TOPICAL_OINTMENT | Freq: Two times a day (BID) | CUTANEOUS | 0 refills | Status: DC

## 2021-02-07 NOTE — Progress Notes (Signed)
E-Visit for Poison Ivy  We are sorry that you are not feeing well.  Here is how we plan to help!  Based on what you have shared with me it looks like you have had an allergic reaction to the oily resin from a group of plants.  This resin is very sticky, so it easily attaches to your skin, clothing, tools equipment, and pet's fur.    This blistering rash is often called poison ivy rash although it can come from contact with the leaves, stems and roots of poison ivy, poison oak and poison sumac.  The oily resin contains urushiol (u-ROO-she-ol) that produces a skin rash on exposed skin.  The severity of the rash depends on the amount of urushiol that gets on your skin.  A section of skin with more urushiol on it may develop a rash sooner.  The rash usually develops 12-48 hours after exposure and can last two to three weeks.  Your skin must come in direct contact with the plant's oil to be affected.  Blister fluid doesn't spread the rash.  However, if you come into contact with a piece of clothing or pet fur that has urushiol on it, the rash may spread out.  You can also transfer the oil to other parts of your body with your fingers.  Often the rash looks like a straight line because of the way the plant brushes against your skin.  Since your rash is widespread or has resulted in a large number of blisters, I have prescribed an oral corticosteroid.  Please follow these recommendations:  I have sent a prednisone dose pack to your chosen pharmacy. Be sure to follow the instructions carefully and complete the entire prescription. You may use Benadryl or Caladryl topical lotions to sooth the itch and remember cool, not hot, showers and baths can help relieve the itching!  Place cool, wet compresses on the affected area for 15-30 minutes several times a day.  You may also take oral antihistamines, such as diphenhydramine (Benadryl, others), which may also help you sleep better.  Watch your skin for any purulent  (pus) drainage or red streaking from the site.  If this occurs, contact your provider.  You may require an antibiotic for a skin infection.  Make sure that the clothes you were wearing as well as any towels or sheets that may have come in contact with the oil (urushiol) are washed in detergent and hot water.       I have developed the following plan to treat your condition I am prescribing a two week course of steroids (37 tablets of 10 mg prednisone).  Days 1-4 take 4 tablets (40 mg) daily  Days 5-8 take 3 tablets (30 mg) daily, Days 9-11 take 2 tablets (20 mg) daily, Days 12-14 take 1 tablet (10 mg) daily.    I have also sent in a steroid cream that you will use twice a day.   What can you do to prevent this rash?  Avoid the plants.  Learn how to identify poison ivy, poison oak and poison sumac in all seasons.  When hiking or engaging in other activities that might expose you to these plants, try to stay on cleared pathways.  If camping, make sure you pitch your tent in an area free of these plants.  Keep pets from running through wooded areas so that urushiol doesn't accidentally stick to their fur, which you may touch.  Remove or kill the plants.  In your   yard, you can get rid of poison ivy by applying an herbicide or pulling it out of the ground, including the roots, while wearing heavy gloves.  Afterward remove the gloves and thoroughly wash them and your hands.  Don't burn poison ivy or related plants because the urushiol can be carried by smoke.  Wear protective clothing.  If needed, protect your skin by wearing socks, boots, pants, long sleeves and vinyl gloves.  Wash your skin right away.  Washing off the oil with soap and water within 30 minutes of exposure may reduce your chances of getting a poison ivy rash.  Even washing after an hour or so can help reduce the severity of the rash.  If you walk through some poison ivy and then later touch your shoes, you may get some urushiol on your  hands, which may then transfer to your face or body by touching or rubbing.  If the contaminated object isn't cleaned, the urushiol on it can still cause a skin reaction years later.    Be careful not to reuse towels after you have washed your skin.  Also carefully wash clothing in detergent and hot water to remove all traces of the oil.  Handle contaminated clothing carefully so you don't transfer the urushiol to yourself, furniture, rugs or appliances.  Remember that pets can carry the oil on their fur and paws.  If you think your pet may be contaminated with urushiol, put on some long rubber gloves and give your pet a bath.  Finally, be careful not to burn these plants as the smoke can contain traces of the oil.  Inhaling the smoke may result in difficulty breathing. If that occurred you should see a physician as soon as possible.  See your doctor right away if:   The reaction is severe or widespread  You inhaled the smoke from burning poison ivy and are having difficulty breathing  Your skin continues to swell  The rash affects your eyes, mouth or genitals  Blisters are oozing pus  You develop a fever greater than 100 F (37.8 C)  The rash doesn't get better within a few weeks.  If you scratch the poison ivy rash, bacteria under your fingernails may cause the skin to become infected.  See your doctor if pus starts oozing from the blisters.  Treatment generally includes antibiotics.  Poison ivy treatments are usually limited to self-care methods.  And the rash typically goes away on its own in two to three weeks.     If the rash is widespread or results in a large number of blisters, your doctor may prescribe an oral corticosteroid, such as prednisone.  If a bacterial infection has developed at the rash site, your doctor may give you a prescription for an oral antibiotic.  MAKE SURE YOU   Understand these instructions.  Will watch your condition.  Will get help right away if  you are not doing well or get worse.  Thank you for choosing an e-visit. Your e-visit answers were reviewed by a board certified advanced clinical practitioner to complete your personal care plan. Depending upon the condition, your plan could have included both over the counter or prescription medications.  Please review your pharmacy choice. If there is a problem you may use MyChart messaging to have the prescription routed to another pharmacy.   Your safety is important to us. If you have drug allergies check your prescription carefully.  You can use MyChart to ask questions about today's   visit, request a non-urgent call back, or ask for a work or school excuse for 24 hours related to this e-Visit. If it has been greater than 24 hours you will need to follow up with your provider, or enter a new e-Visit to address those concerns.   You will get an email in the next two days asking about your experience. I hope that your e-visit has been valuable and will speed your recovery Thank you for choosing an e-visit.     Approximately 5 minutes was spent documenting and reviewing patient's chart.   

## 2021-03-05 ENCOUNTER — Other Ambulatory Visit: Payer: Self-pay | Admitting: Family

## 2021-03-05 DIAGNOSIS — E039 Hypothyroidism, unspecified: Secondary | ICD-10-CM

## 2021-04-05 ENCOUNTER — Other Ambulatory Visit: Payer: Self-pay | Admitting: Family

## 2021-06-04 ENCOUNTER — Other Ambulatory Visit: Payer: Self-pay | Admitting: Family

## 2021-06-04 DIAGNOSIS — E039 Hypothyroidism, unspecified: Secondary | ICD-10-CM

## 2021-06-05 ENCOUNTER — Other Ambulatory Visit: Payer: Self-pay | Admitting: Primary Care

## 2021-06-07 ENCOUNTER — Telehealth: Payer: Self-pay | Admitting: Family

## 2021-06-07 NOTE — Telephone Encounter (Signed)
Left message for patient to return call to office. 

## 2021-06-07 NOTE — Telephone Encounter (Signed)
Patient states she received a call from our office. States it was a female saying she was calling from [employer]. Patient states that they did not leave a name or reason.   Did not see any new encounters in Patient chart. Please advise

## 2021-06-08 ENCOUNTER — Other Ambulatory Visit: Payer: Self-pay | Admitting: Family

## 2021-06-08 ENCOUNTER — Other Ambulatory Visit: Payer: Self-pay

## 2021-06-08 ENCOUNTER — Telehealth: Payer: Self-pay | Admitting: Internal Medicine

## 2021-06-08 DIAGNOSIS — E039 Hypothyroidism, unspecified: Secondary | ICD-10-CM

## 2021-06-08 MED ORDER — TOPIRAMATE 100 MG PO TABS: ORAL_TABLET | Freq: Two times a day (BID) | ORAL | 3 refills | Status: DC

## 2021-06-08 NOTE — Telephone Encounter (Signed)
Spoke to patient, who is requesting Rx epipen. Patient received nucala injections through allergy. Advised patient to contact allergy for refills.  Patient voiced her understanding.  Nothing further needed.

## 2021-06-08 NOTE — Telephone Encounter (Signed)
See message to call back under refill request. Did you contact patient to schedule a follow up appointment for refills?

## 2021-06-08 NOTE — Telephone Encounter (Signed)
See refill request note

## 2021-06-08 NOTE — Telephone Encounter (Signed)
Patient ha snot been seen a in a year but has scheduled follow up okay to fill Levothyroxine for 30 days?

## 2021-06-09 ENCOUNTER — Other Ambulatory Visit: Payer: Self-pay

## 2021-06-09 DIAGNOSIS — E039 Hypothyroidism, unspecified: Secondary | ICD-10-CM

## 2021-06-09 MED ORDER — LEVOTHYROXINE SODIUM 75 MCG PO TABS: 75.0000 ug | ORAL_TABLET | Freq: Every day | ORAL | 0 refills | Status: DC

## 2021-06-09 NOTE — Telephone Encounter (Signed)
LMTCB. I let patient know that we needed to get her scheduled with Joycelyn Schmid. Refilled for 30 days ONLY.

## 2021-06-10 MED ORDER — LEVOTHYROXINE SODIUM 75 MCG PO TABS: 75.0000 ug | ORAL_TABLET | Freq: Every day | ORAL | 0 refills | Status: DC

## 2021-06-30 NOTE — Telephone Encounter (Signed)
Call patient Due for mammogram.  Please schedule for her

## 2021-07-01 ENCOUNTER — Encounter: Payer: Self-pay | Admitting: Primary Care

## 2021-07-01 ENCOUNTER — Other Ambulatory Visit: Payer: Self-pay

## 2021-07-01 ENCOUNTER — Ambulatory Visit (INDEPENDENT_AMBULATORY_CARE_PROVIDER_SITE_OTHER): Payer: BC Managed Care – PPO | Admitting: Primary Care

## 2021-07-01 DIAGNOSIS — G4733 Obstructive sleep apnea (adult) (pediatric): Secondary | ICD-10-CM

## 2021-07-01 DIAGNOSIS — R059 Cough, unspecified: Secondary | ICD-10-CM

## 2021-07-01 DIAGNOSIS — J4540 Moderate persistent asthma, uncomplicated: Secondary | ICD-10-CM | POA: Diagnosis not present

## 2021-07-01 DIAGNOSIS — J4520 Mild intermittent asthma, uncomplicated: Secondary | ICD-10-CM

## 2021-07-01 DIAGNOSIS — J0100 Acute maxillary sinusitis, unspecified: Secondary | ICD-10-CM

## 2021-07-01 MED ORDER — FLUTICASONE PROPIONATE 50 MCG/ACT NA SUSP
2.0000 | Freq: Every day | NASAL | 11 refills | Status: DC
Start: 2021-07-01 — End: 2022-03-22

## 2021-07-01 MED ORDER — ALBUTEROL SULFATE HFA 108 (90 BASE) MCG/ACT IN AERS: INHALATION_SPRAY | RESPIRATORY_TRACT | 3 refills | Status: DC

## 2021-07-01 MED ORDER — SPIRIVA RESPIMAT 1.25 MCG/ACT IN AERS
2.0000 | INHALATION_SPRAY | Freq: Every day | RESPIRATORY_TRACT | 11 refills | Status: DC
Start: 2021-07-01 — End: 2022-04-19

## 2021-07-01 MED ORDER — CETIRIZINE HCL 10 MG PO TABS
10.0000 mg | ORAL_TABLET | Freq: Every day | ORAL | 11 refills | Status: DC
Start: 2021-07-01 — End: 2022-07-17

## 2021-07-01 MED ORDER — FEXOFENADINE HCL 180 MG PO TABS
180.0000 mg | ORAL_TABLET | Freq: Every day | ORAL | 11 refills | Status: DC
Start: 2021-07-01 — End: 2022-07-17

## 2021-07-01 NOTE — Progress Notes (Signed)
@Patient  ID: Bridget Peters, female    DOB: [birth year], 50 y.o.   MRN: [id number]  Chief Complaint  Patient presents with   Follow-up    Pt states no concerns    Referring provider: Burnard Hawthorne, FNP  HPI: 50 year old female, never smoked. PMH significant for moderate persistent asthma, OSA, sinusitis, HTN, aortic atherosclerosis, type 2 diabetes, hypothyroidism. Patient of Dr. Mortimer Fries, last seen on 07/23/20. Maintained on Spiriva respimat 1.63mcg, Singualir, Allergra, Flonase and prn ventolin.   Previous [facility] pulmonary encounter:  05/10/2020 Patient presents today for for regular follow-up. She is doing well, needs refill of her medication. She is receiving Nucala injections through allergy/immunology. She has had no recent asthma exacerbations. She was prescribed prednisone for poison oak rash. Cough, wheezing and shortness of breath are well controlled on present treatment. She uses Ventolin as needed approx once a month, symptoms usually worse in the fall. She developed trouble with memory, speaking and balance after receiving her first covid vaccine. He works at [employer], received a letter from her PCP hopefully exempting her for second vaccine.   07/01/2021- Interim hx  Patient presents today for annual follow-up/asthma. She is doing well, no acute complaints. She experiences intermittent wheezing and chest tightness at night, relieved with Albuterol rescue inhaler. She has had no recent exacerbations requiring oral prednisone. She is maintained on Spiriva 1.75mcg two puffs daily and continues on Nucula home injections, last dose was September 11th. She also takes Singulair 10mg  at bedtime, Allegra, Zyrtec and flonase as directed. ACT score 21.     Allergies  Allergen Reactions   Shellfish Allergy Hives and Shortness Of Breath   Iodinated Diagnostic Agents Hives    Pt developed hives approximately 6 hours after receiving Isovue 300 for CT Scan. She was given 50mG  oral Benadryl  and symptoms improved   Milk-Related Compounds Hives   Tamiflu [Oseltamivir Phosphate] Hives   Wheat Bran Hives   Covid-19 (Adenovirus) Vaccine     Pfizer   Acetazolamide Rash   Xolair [Omalizumab] Itching    Immunization History  Administered Date(s) Administered   Influenza Split 07/03/2016   Influenza, Seasonal, Injecte, Preservative Fre 08/05/2015   Influenza,inj,Quad PF,6+ Mos 07/26/2020   PFIZER(Purple Top)SARS-COV-2 Vaccination 10/13/2019   Pneumococcal Conjugate-13 04/24/2017   Pneumococcal Polysaccharide-23 02/13/2018   Pneumococcal-Unspecified 02/12/2012   Tdap 10/11/2009    Past Medical History:  Diagnosis Date   Allergy    Seasonal   Asthma    Complication of anesthesia    nausea and vomitting   Depression    Diabetes mellitus without complication ([place])    Family history of adverse reaction to anesthesia    nausea and vomitting   Hypertension    Hypothyroidism    Pneumonia    PONV (postoperative nausea and vomiting)    Shortness of breath    Sleep apnea    Thyroid disease     Tobacco History: Social History   Tobacco Use  Smoking Status Never  Smokeless Tobacco Never   Counseling given: Not Answered   Outpatient Medications Prior to Visit  Medication Sig Dispense Refill   albuterol (PROVENTIL) (2.5 MG/3ML) 0.083% nebulizer solution Take 3 mLs (2.5 mg total) by nebulization every 6 (six) hours as needed for wheezing or shortness of breath. 75 mL 12   amLODipine (NORVASC) 2.5 MG tablet TAKE 1 TABLET BY MOUTH EVERY DAY 90 tablet 0   EPIPEN 2-PAK 0.3 MG/0.3ML SOAJ injection   11   gabapentin (NEURONTIN) 100  MG capsule Take 1 capsule (100 mg total) by mouth at bedtime. 90 capsule 3   levothyroxine (SYNTHROID) 75 MCG tablet Take 1 tablet (75 mcg total) by mouth daily. 90 tablet 0   montelukast (SINGULAIR) 10 MG tablet TAKE 1 TABLET BY MOUTH EVERY DAY 30 tablet 2   naproxen (NAPROSYN) 500 MG tablet Take 1 tablet (500 mg total) by mouth 2 (two) times  daily with a meal. 30 tablet 0   NUCALA 100 MG/ML SOAJ INJECT 1 SYRINGE SUBCUTANEOUSLY EVERY FOUR WEEKS 1 mL 11   pantoprazole (PROTONIX) 40 MG tablet Take 1 tablet (40 mg total) by mouth daily. 90 tablet 3   predniSONE (DELTASONE) 10 MG tablet Days 1-4 take 4 tablets (40 mg) daily  Days 5-8 take 3 tablets (30 mg) daily, Days 9-11 take 2 tablets (20 mg) daily, Days 12-14 take 1 tablet (10 mg) daily. 37 tablet 0   RESTASIS MULTIDOSE 0.05 % ophthalmic emulsion      sertraline (ZOLOFT) 50 MG tablet Take 1 tablet (50 mg total) by mouth at bedtime. 90 tablet 3   topiramate (TOPAMAX) 100 MG tablet TAKE 1 TABLET BY MOUTH TWICE DAILY 180 tablet 3   albuterol (VENTOLIN HFA) 108 (90 Base) MCG/ACT inhaler INHALE 2 PUFFS INTO THE LUNGS EVERY 4 HOURS AS NEEDED FOR WHEEZING OR SHORTNESS OF BREATH. 18 g 3   cetirizine (ZYRTEC ALLERGY) 10 MG tablet Take 1 tablet (10 mg total) by mouth daily. 30 tablet 6   fexofenadine (ALLEGRA) 180 MG tablet Take 1 tablet (180 mg total) by mouth daily. 30 tablet 0   fluticasone (FLONASE) 50 MCG/ACT nasal spray Place 2 sprays into both nostrils daily. 16 g 6   Tiotropium Bromide Monohydrate (SPIRIVA RESPIMAT) 1.25 MCG/ACT AERS Inhale 2 puffs into the lungs daily. 4 g 11   triamcinolone ointment (KENALOG) 0.5 % Apply 1 application topically 2 (two) times daily. 30 g 0   meloxicam (MOBIC) 7.5 MG tablet Take 1 tablet (7.5 mg total) by mouth daily as needed for pain. 30 tablet 1   Facility-Administered Medications Prior to Visit  Medication Dose Route Frequency Provider Last Rate Last Admin   Mepolizumab SOLR 100 mg  100 mg Subcutaneous Q28 days Flora Lipps, MD   100 mg at 08/03/17 3500      Review of Systems  Review of Systems   Physical Exam  BP (!) 152/88 (BP Location: Left Arm, Patient Position: Sitting, Cuff Size: Normal)   Pulse 72   Temp (!) 97.3 F (36.3 C) (Oral)   Ht 5\' 2"  (1.575 m)   Wt 153 lb 12.8 oz (69.8 kg)   SpO2 99%   BMI 28.13 kg/m  Physical Exam    Lab Results:  CBC    Component Value Date/Time   WBC 5.5 05/10/2020 1241   RBC 4.98 05/10/2020 1241   HGB 14.9 05/10/2020 1241   HCT 43.8 05/10/2020 1241   PLT 274.0 05/10/2020 1241   MCV 88.1 05/10/2020 1241   MCH 29.4 03/25/2020 0837   MCHC 34.0 05/10/2020 1241   RDW 13.8 05/10/2020 1241   LYMPHSABS 1.8 05/10/2020 1241   MONOABS 0.4 05/10/2020 1241   EOSABS 0.0 05/10/2020 1241   BASOSABS 0.0 05/10/2020 1241    BMET    Component Value Date/Time   NA 138 05/10/2020 1241   K 3.8 05/10/2020 1241   CL 106 05/10/2020 1241   CO2 22 05/10/2020 1241   GLUCOSE 108 (H) 05/10/2020 1241   BUN 18 05/10/2020 1241  CREATININE 0.71 05/10/2020 1241   CREATININE 0.90 05/14/2017 1220   CALCIUM 9.6 05/10/2020 1241   GFRNONAA >60 06/06/2018 0721   GFRAA >60 06/06/2018 0721    BNP No results found for: BNP  ProBNP No results found for: PROBNP  Imaging: No results found.   Assessment & Plan:   Asthma, moderate persistent - Stable interval; No recent exacerbations requiring oral prednisone. Uses SABA at night 1-2 times a week for intermittent wheezing/chest tightness. ACT 21.  - Continue Nucala home injections q 4 weeks  - Continue Spiriva 1.93mcg two puffs once daily in the morning - Use Ventolin rescue 2 puffs every 6 hours as needed for breakthrough shortness of breath/wheezing - Continue Singulair, Flonase, Allegra and Zyrtec as directed  - Prednisone taper on hand if needed  - Follow-up 1 year with Dr. Mortimer Fries or Eustaquio Maize NP    OSA (obstructive sleep apnea) - Moderate sleep apnea, patient did not want to pursue CPAP therapy. No complaints of fatigue or interrupted sleep today. Continue to maintained healthy weight range and encourage side sleeping position. Oral appliance or Inspire device are options in the future if needed.    Martyn Ehrich, NP 07/04/2021

## 2021-07-01 NOTE — Patient Instructions (Addendum)
Recommendations: - Continue Nucala as directed  - Continue Spiriva 1.41mcg- two puffs once daily in the morning - Use Ventolin rescue 2 puffs every 6 hours as needed for breakthrough shortness of breath/wheezing - Continue Singulair, Flonase, Allegra and Zyrtec  - Prednisone on hand if needed   Follow-up: - 1 year with Dr. Mortimer Fries or Eustaquio Maize NP     Asthma, Adult Asthma is a long-term (chronic) condition that causes recurrent episodes in which the airways become tight and narrow. The airways are the passages that lead from the nose and mouth down into the lungs. Asthma episodes, also called asthma attacks, can cause coughing, wheezing, shortness of breath, and chest pain. The airways can also fill with mucus. During an attack, it can be difficult to breathe. Asthma attacks can range from minor to life threatening. Asthma cannot be cured, but medicines and lifestyle changes can help control it and treat acute attacks. What are the causes? This condition is believed to be caused by inherited (genetic) and environmental factors, but its exact cause is not known. There are many things that can bring on an asthma attack or make asthma symptoms worse (triggers). Asthma triggers are different for each person. Common triggers include: Mold. Dust. Cigarette smoke. Cockroaches. Things that can cause allergy symptoms (allergens), such as animal dander or pollen from trees or grass. Air pollutants such as household cleaners, wood smoke, smog, or Advertising account planner. Cold air, weather changes, and winds (which increase molds and pollen in the air). Strong emotional expressions such as crying or laughing hard. Stress. Certain medicines (such as aspirin) or types of medicines (such as beta-blockers). Sulfites in foods and drinks. Foods and drinks that may contain sulfites include dried fruit, potato chips, and sparkling grape juice. Infections or inflammatory conditions such as the flu, a cold, or inflammation of  the nasal membranes (rhinitis). Gastroesophageal reflux disease (GERD). Exercise or strenuous activity. What are the signs or symptoms? Symptoms of this condition may occur right after asthma is triggered or many hours later. Symptoms include: Wheezing. This can sound like whistling when you breathe. Excessive nighttime or early morning coughing. Frequent or severe coughing with a common cold. Chest tightness. Shortness of breath. Tiredness (fatigue) with minimal activity. How is this diagnosed? This condition is diagnosed based on: Your medical history. A physical exam. Tests, which may include: Lung function studies and pulmonary studies (spirometry). These tests can evaluate the flow of air in your lungs. Allergy tests. Imaging tests, such as X-rays. How is this treated? There is no cure for this condition, but treatment can help control your symptoms. Treatment for asthma usually involves: Identifying and avoiding your asthma triggers. Using medicines to control your symptoms. Generally, two types of medicines are used to treat asthma: Controller medicines. These help prevent asthma symptoms from occurring. They are usually taken every day. Fast-acting reliever or rescue medicines. These quickly relieve asthma symptoms by widening the narrow and tight airways. They are used as needed and provide short-term relief. Using supplemental oxygen. This may be needed during a severe episode. Using other medicines, such as: Allergy medicines, such as antihistamines, if your asthma attacks are triggered by allergens. Immune medicines (immunomodulators). These are medicines that help control the immune system. Creating an asthma action plan. An asthma action plan is a written plan for managing and treating your asthma attacks. This plan includes: A list of your asthma triggers and how to avoid them. Information about when medicines should be taken and when their dosage should  be  changed. Instructions about using a device called a peak flow meter. A peak flow meter measures how well the lungs are working and the severity of your asthma. It helps you monitor your condition. Follow these instructions at home: Controlling your home environment Control your home environment in the following ways to help avoid triggers and prevent asthma attacks: Change your heating and air conditioning filter regularly. Limit your use of fireplaces and wood stoves. Get rid of pests (such as roaches and mice) and their droppings. Throw away plants if you see mold on them. Clean floors and dust surfaces regularly. Use unscented cleaning products. Try to have someone else vacuum for you regularly. Stay out of rooms while they are being vacuumed and for a short while afterward. If you vacuum, use a dust mask from a hardware store, a double-layered or microfilter vacuum cleaner bag, or a vacuum cleaner with a HEPA filter. Replace carpet with wood, tile, or vinyl flooring. Carpet can trap dander and dust. Use allergy-proof pillows, mattress covers, and box spring covers. Keep your bedroom a trigger-free room. Avoid pets and keep windows closed when allergens are in the air. Wash beddings every week in hot water and dry them in a dryer. Use blankets that are made of polyester or cotton. Clean bathrooms and kitchens with bleach. If possible, have someone repaint the walls in these rooms with mold-resistant paint. Stay out of the rooms that are being cleaned and painted. Wash your hands often with soap and water. If soap and water are not available, use hand sanitizer. Do not allow anyone to smoke in your home. General instructions Take over-the-counter and prescription medicines only as told by your health care provider. Speak with your health care provider if you have questions about how or when to take the medicines. Make note if you are requiring more frequent dosages. Do not use any products  that contain nicotine or tobacco, such as cigarettes and e-cigarettes. If you need help quitting, ask your health care provider. Also, avoid being exposed to secondhand smoke. Use a peak flow meter as told by your health care provider. Record and keep track of the readings. Understand and use the asthma action plan to help minimize, or stop an asthma attack, without needing to seek medical care. Make sure you stay up to date on your yearly vaccinations as told by your health care provider. This may include vaccines for the flu and pneumonia. Avoid outdoor activities when allergen counts are high and when air quality is low. Wear a ski mask that covers your nose and mouth during outdoor winter activities. Exercise indoors on cold days if you can. Warm up before exercising, and take time for a cool-down period after exercise. Keep all follow-up visits as told by your health care provider. This is important. Where to find more information For information about asthma, turn to the Centers for Disease Control and Prevention at http://www.clark.net/ For air quality information, turn to [employer] at https://www.miller-reyes.info/ Contact a health care provider if: You have wheezing, shortness of breath, or a cough even while you are taking medicine to prevent attacks. The mucus you cough up (sputum) is thicker than usual. Your sputum changes from clear or white to yellow, green, gray, or bloody. Your medicines are causing side effects, such as a rash, itching, swelling, or trouble breathing. You need to use a reliever medicine more than 2-3 times a week. Your peak flow reading is still at 50-79% of your personal best after  following your action plan for 1 hour. You have a fever. Get help right away if: You are getting worse and do not respond to treatment during an asthma attack. You are short of breath when at rest or when doing very little physical activity. You have difficulty eating, drinking, or talking. You have  chest pain or tightness. You develop a fast heartbeat or palpitations. You have a bluish color to your lips or fingernails. You are light-headed or dizzy, or you faint. Your peak flow reading is less than 50% of your personal best. You feel too tired to breathe normally. Summary Asthma is a long-term (chronic) condition that causes recurrent episodes in which the airways become tight and narrow. These episodes can cause coughing, wheezing, shortness of breath, and chest pain. Asthma cannot be cured, but medicines and lifestyle changes can help control it and treat acute attacks. Make sure you understand how to avoid triggers and how and when to use your medicines. Asthma attacks can range from minor to life threatening. Get help right away if you have an asthma attack and do not respond to treatment with your usual rescue medicines. This information is not intended to replace advice given to you by your health care provider. Make sure you discuss any questions you have with your health care provider. Document Revised: 06/25/2020 Document Reviewed: 01/28/2020 Elsevier Patient Education  2022 [employer].

## 2021-07-04 NOTE — Assessment & Plan Note (Addendum)
-   Stable interval; No recent exacerbations requiring oral prednisone. Uses SABA at night 1-2 times a week for intermittent wheezing/chest tightness. ACT 21.  - Continue Nucala home injections q 4 weeks  - Continue Spiriva 1.43mcg two puffs once daily in the morning - Use Ventolin rescue 2 puffs every 6 hours as needed for breakthrough shortness of breath/wheezing - Continue Singulair, Flonase, Allegra and Zyrtec as directed  - Prednisone taper on hand if needed  - Follow-up 1 year with Dr. Mortimer Fries or Eustaquio Maize NP

## 2021-07-04 NOTE — Telephone Encounter (Signed)
I have added to note when patient comes in Wednesday to schedule for so I know will times.

## 2021-07-04 NOTE — Assessment & Plan Note (Signed)
-   Moderate sleep apnea, patient did not want to pursue CPAP therapy. No complaints of fatigue or interrupted sleep today. Continue to maintained healthy weight range and encourage side sleeping position. Oral appliance or Inspire device are options in the future if needed.

## 2021-07-05 ENCOUNTER — Other Ambulatory Visit: Payer: Self-pay | Admitting: Family

## 2021-07-06 ENCOUNTER — Other Ambulatory Visit: Payer: Self-pay

## 2021-07-06 ENCOUNTER — Ambulatory Visit (INDEPENDENT_AMBULATORY_CARE_PROVIDER_SITE_OTHER): Payer: BC Managed Care – PPO | Admitting: Family

## 2021-07-06 ENCOUNTER — Encounter: Payer: Self-pay | Admitting: Family

## 2021-07-06 VITALS — BP 122/81 | HR 71 | Temp 97.8°F | Ht 62.0 in | Wt 155.4 lb

## 2021-07-06 DIAGNOSIS — I10 Essential (primary) hypertension: Secondary | ICD-10-CM

## 2021-07-06 DIAGNOSIS — E119 Type 2 diabetes mellitus without complications: Secondary | ICD-10-CM

## 2021-07-06 DIAGNOSIS — R5383 Other fatigue: Secondary | ICD-10-CM

## 2021-07-06 DIAGNOSIS — E039 Hypothyroidism, unspecified: Secondary | ICD-10-CM

## 2021-07-06 DIAGNOSIS — E7800 Pure hypercholesterolemia, unspecified: Secondary | ICD-10-CM | POA: Diagnosis not present

## 2021-07-06 DIAGNOSIS — Z23 Encounter for immunization: Secondary | ICD-10-CM | POA: Diagnosis not present

## 2021-07-06 NOTE — Progress Notes (Signed)
Subjective:    Patient ID: Bridget Peters, female    DOB: [birth year], 50 y.o.   MRN: [id number]  CC: Bridget Peters is a 50 y.o. female who presents today for follow up.   HPI: Feels well today.  No complaints.  She has a new job and is so much happier.   DM-she is currently not on any medication for diabetes. She has lost 35lbs.  Anxiety depression-compliant with Zoloft 50 mg and feels well controlled. No si/hi.   hypertension-compliant with amlodipine 2.5 mg. No cp.   Hypothyroidism-compliant with Synthroid 75 MCG Asthma-compliant with Zyrtec, Flonase, Nucala, Singulair. Following with Bridget Peters , last seen 07/01/21  HISTORY:  Past Medical History:  Diagnosis Date   Allergy    Seasonal   Asthma    Complication of anesthesia    nausea and vomitting   Depression    Diabetes mellitus without complication ([place])    Family history of adverse reaction to anesthesia    nausea and vomitting   Hypertension    Hypothyroidism    Pneumonia    PONV (postoperative nausea and vomiting)    Shortness of breath    Sleep apnea    Thyroid disease    Past Surgical History:  Procedure Laterality Date   BREAST SURGERY  01/2013   Breast Reduction    COLONOSCOPY WITH PROPOFOL N/A 09/10/2020   Procedure: COLONOSCOPY WITH PROPOFOL;  Surgeon: Lucilla Lame, MD;  Location: [facility];  Service: Endoscopy;  Laterality: N/A;  priority 4   NASAL SINUS SURGERY     NASAL SINUS SURGERY  02/2014   REDUCTION MAMMAPLASTY Bilateral 2014   WISDOM TOOTH EXTRACTION     x4   Family History  Problem Relation Age of Onset   Hypertension Mother    Endometrial cancer Mother 29   Hypertension Father    Colon polyps Father    Cancer Maternal Aunt        Breast Cancer   Breast cancer Maternal Aunt    Breast cancer Paternal Aunt        Breast Cancer   Cancer Maternal Grandmother 85       Bladder Cancer   Stroke Maternal Grandmother    Breast cancer Maternal Grandmother 85   Cancer  Maternal Grandfather 68       Esophagus Cancer   Colon cancer Neg Hx    Ovarian cancer Neg Hx     Allergies: Shellfish allergy, Iodinated diagnostic agents, Milk-related compounds, Tamiflu [oseltamivir phosphate], Wheat bran, Covid-19 (adenovirus) vaccine, Acetazolamide, and Xolair [omalizumab] Current Outpatient Medications on File Prior to Visit  Medication Sig Dispense Refill   albuterol (PROVENTIL) (2.5 MG/3ML) 0.083% nebulizer solution Take 3 mLs (2.5 mg total) by nebulization every 6 (six) hours as needed for wheezing or shortness of breath. 75 mL 12   albuterol (VENTOLIN HFA) 108 (90 Base) MCG/ACT inhaler INHALE 2 PUFFS INTO THE LUNGS EVERY 4 HOURS AS NEEDED FOR WHEEZING OR SHORTNESS OF BREATH. 18 g 3   amLODipine (NORVASC) 2.5 MG tablet TAKE 1 TABLET BY MOUTH EVERY DAY 90 tablet 0   cetirizine (ZYRTEC ALLERGY) 10 MG tablet Take 1 tablet (10 mg total) by mouth daily. 30 tablet 11   EPIPEN 2-PAK 0.3 MG/0.3ML SOAJ injection   11   fexofenadine (ALLEGRA) 180 MG tablet Take 1 tablet (180 mg total) by mouth daily. 30 tablet 11   fluticasone (FLONASE) 50 MCG/ACT nasal spray Place 2 sprays into both nostrils daily. 16 g 11  gabapentin (NEURONTIN) 100 MG capsule Take 1 capsule (100 mg total) by mouth at bedtime. 90 capsule 3   levothyroxine (SYNTHROID) 75 MCG tablet Take 1 tablet (75 mcg total) by mouth daily. 90 tablet 0   montelukast (SINGULAIR) 10 MG tablet TAKE 1 TABLET BY MOUTH EVERY DAY 30 tablet 2   naproxen (NAPROSYN) 500 MG tablet Take 1 tablet (500 mg total) by mouth 2 (two) times daily with a meal. 30 tablet 0   NUCALA 100 MG/ML SOAJ INJECT 1 SYRINGE SUBCUTANEOUSLY EVERY FOUR WEEKS 1 mL 11   pantoprazole (PROTONIX) 40 MG tablet Take 1 tablet (40 mg total) by mouth daily. 90 tablet 3   RESTASIS MULTIDOSE 0.05 % ophthalmic emulsion      sertraline (ZOLOFT) 50 MG tablet Take 1 tablet (50 mg total) by mouth at bedtime. 90 tablet 3   Tiotropium Bromide Monohydrate (SPIRIVA RESPIMAT)  1.25 MCG/ACT AERS Inhale 2 puffs into the lungs daily. 4 g 11   topiramate (TOPAMAX) 100 MG tablet TAKE 1 TABLET BY MOUTH TWICE DAILY 180 tablet 3   Current Facility-Administered Medications on File Prior to Visit  Medication Dose Route Frequency Provider Last Rate Last Admin   Mepolizumab SOLR 100 mg  100 mg Subcutaneous Q28 days Bridget Lipps, MD   100 mg at 08/03/17 2440    Social History   Tobacco Use   Smoking status: Never   Smokeless tobacco: Never  Substance Use Topics   Alcohol use: Yes    Alcohol/week: 0.0 standard drinks    Comment: occasionally   Drug use: No    Review of Systems  Constitutional:  Negative for chills and fever.  Respiratory:  Negative for cough.   Cardiovascular:  Negative for chest pain and palpitations.  Gastrointestinal:  Negative for nausea and vomiting.     Objective:    BP 122/81 (BP Location: Left Arm, Patient Position: Sitting, Cuff Size: Normal)   Pulse 71   Temp 97.8 F (36.6 C) (Oral)   Ht 5\' 2"  (1.575 m)   Wt 155 lb 6.4 oz (70.5 kg)   SpO2 98%   BMI 28.42 kg/m  BP Readings from Last 3 Encounters:  07/06/21 122/81  07/01/21 (!) 152/88  09/10/20 (!) 108/93   Wt Readings from Last 3 Encounters:  07/06/21 155 lb 6.4 oz (70.5 kg)  07/01/21 153 lb 12.8 oz (69.8 kg)  09/10/20 144 lb (65.3 kg)    Physical Exam Vitals reviewed.  Constitutional:      Appearance: She is well-developed.  Eyes:     Conjunctiva/sclera: Conjunctivae normal.  Cardiovascular:     Rate and Rhythm: Normal rate and regular rhythm.     Pulses: Normal pulses.     Heart sounds: Normal heart sounds.  Pulmonary:     Effort: Pulmonary effort is normal.     Breath sounds: Normal breath sounds. No wheezing, rhonchi or rales.  Skin:    General: Skin is warm and dry.  Neurological:     Mental Status: She is alert.  Psychiatric:        Speech: Speech normal.        Behavior: Behavior normal.        Thought Content: Thought content normal.        Assessment & Plan:   Problem List Items Addressed This Visit       Cardiovascular and Mediastinum   HTN (hypertension)    Excellent control.  Continue amlodipine 2.5 mg      Relevant Orders  Comprehensive metabolic panel     Endocrine   Diabetes mellitus type 2, controlled, without complications ([place])    Lab Results  Component Value Date   HGBA1C 5.8 08/27/2020  Excellent control with diet.  Patient continues to lose weight.  Congratulated her on her hard work.       Relevant Orders   Hemoglobin A1c   Urine Microalbumin w/creat. ratio   Hypothyroidism - Primary    Anticipate euthyroid.  Pending TSH.  Continue Synthroid 75 MCG      Relevant Orders   TSH     Other   Fatigue   Relevant Orders   VITAMIN D 25 Hydroxy (Vit-D Deficiency, Fractures)   CBC with Differential/Platelet   Hypercholesteremia   Relevant Orders   Lipid panel     I have discontinued Bridget Peters's predniSONE. I am also having her maintain her EpiPen 2-Pak, albuterol, Restasis MultiDose, pantoprazole, naproxen, sertraline, gabapentin, Nucala, montelukast, topiramate, levothyroxine, Spiriva Respimat, fexofenadine, fluticasone, cetirizine, albuterol, and amLODipine. We will continue to administer mepolizumab.   No orders of the defined types were placed in this encounter.   Return precautions given.   Risks, benefits, and alternatives of the medications and treatment plan prescribed today were discussed, and patient expressed understanding.   Education regarding symptom management and diagnosis given to patient on AVS.  Continue to follow with Bridget Hawthorne, Bridget Peters for routine health maintenance.   Bridget Peters and I agreed with plan.   Bridget Paris, Bridget Peters

## 2021-07-06 NOTE — Assessment & Plan Note (Signed)
Excellent control.  Continue amlodipine 2.5 mg

## 2021-07-06 NOTE — Assessment & Plan Note (Signed)
Well-controlled, continue Zoloft 50 mg

## 2021-07-06 NOTE — Assessment & Plan Note (Signed)
Anticipate euthyroid.  Pending TSH.  Continue Synthroid 75 MCG

## 2021-07-06 NOTE — Patient Instructions (Signed)
Nice to see you!   

## 2021-07-06 NOTE — Assessment & Plan Note (Signed)
Lab Results  Component Value Date   HGBA1C 5.8 08/27/2020   Excellent control with diet.  Patient continues to lose weight.  Congratulated her on her hard work.

## 2021-07-06 NOTE — Assessment & Plan Note (Signed)
Chronic, stable.  She is following with allergy, pulmonology.  Continue current regimen.  Will follow

## 2021-07-18 ENCOUNTER — Telehealth: Payer: BC Managed Care – PPO | Admitting: Physician Assistant

## 2021-07-18 ENCOUNTER — Other Ambulatory Visit: Payer: Self-pay | Admitting: Family

## 2021-07-18 DIAGNOSIS — Z20822 Contact with and (suspected) exposure to covid-19: Secondary | ICD-10-CM | POA: Diagnosis not present

## 2021-07-18 DIAGNOSIS — L237 Allergic contact dermatitis due to plants, except food: Secondary | ICD-10-CM

## 2021-07-18 DIAGNOSIS — J4540 Moderate persistent asthma, uncomplicated: Secondary | ICD-10-CM

## 2021-07-18 MED ORDER — PREDNISONE 20 MG PO TABS: 20.0000 mg | ORAL_TABLET | Freq: Every day | ORAL | 0 refills | Status: DC

## 2021-07-18 MED ORDER — AZITHROMYCIN 250 MG PO TABS: ORAL_TABLET | ORAL | 0 refills | Status: AC

## 2021-07-18 NOTE — Telephone Encounter (Signed)
Dr. Kasa, please advise. Thanks °

## 2021-07-18 NOTE — Telephone Encounter (Signed)
Per Dr. Mortimer Fries via epic secure chat-- 20 mg daily for 10 days, z pak and advise Covid PCR testing

## 2021-07-18 NOTE — Telephone Encounter (Signed)
Routing to Dr. Kasa as an FYI ?

## 2021-07-18 NOTE — Progress Notes (Signed)
E-Visit for Corona Virus Screening  Your current symptoms could be consistent with the coronavirus.  Many health care providers can now test patients at their office but not all are.  [facility] has multiple testing sites. For information on our [place] testing locations and hours go to [email or web address]  We are enrolling you in our [place] for [place] . Daily you will receive a questionnaire within the [place] website. Our COVID 19 response team will be monitoring your responses daily.  Testing Information: The COVID-19 Community Testing sites are testing BY APPOINTMENT ONLY.  You can schedule online at [email or web address]  If you do not have access to a smart phone or computer you may call [phone] for an appointment.   Additional testing sites in the Community:  For [facility] Testing sites in [place]  FaceUpdate.uy  For Pop-up testing sites in [place]  BowlDirectory.co.uk  For [facility] [email or web address]  For [facility] testing in [place] and [employer] [email or web address]  For [facility] testing in [place]   [email or web address]  For  more information about community testing call [phone]   Please quarantine yourself while awaiting your test results. Please stay home for a minimum of 10 days from the first day of illness with improving symptoms and you have had 24 hours of no fever (without the use of Tylenol (Acetaminophen) Motrin (Ibuprofen) or any fever reducing medication).  Also - Do not get tested prior to returning to work because once you have had a positive test the test can stay positive for  more than a month in some cases.   You should wear a mask or cloth face covering over your nose and mouth if you must be around other people or animals, including pets (even at home). Try to stay at least 6 feet away from other people. This will protect the people around you.  Please continue good preventive care measures, including:  frequent hand-washing, avoid touching your face, cover coughs/sneezes, stay out of crowds and keep a 6 foot distance from others.  COVID-19 is a respiratory illness with symptoms that are similar to the flu. Symptoms are typically mild to moderate, but there have been cases of severe illness and death due to the virus.   The following symptoms may appear 2-14 days after exposure: Fever Cough Shortness of breath or difficulty breathing Chills Repeated shaking with chills Muscle pain Headache Sore throat New loss of taste or smell Fatigue Congestion or runny nose Nausea or vomiting Diarrhea  Go to the nearest hospital ED for assessment if fever/cough/breathlessness are severe or illness seems like a threat to life.  It is vitally important that if you feel that you have an infection such as this virus or any other virus that you stay home and away from places where you may spread it to others.  You should avoid contact with people age 9 and older.   I see where Dr. Mortimer Fries is sending in prednisone 20mg  for 10 days and a Zpak. He also recommends for covid testing. He recommended a PCR test, which is the one done through a lab and sent off. You could also test with at home rapid test as well. If you do test positive for Covid, please do a video visit as you would be a candidate for antiviral treatment.  You may also take acetaminophen (Tylenol) as needed for fever.  Reduce your risk of any infection by using the same precautions used for avoiding the common cold  or flu:  Wash your hands often with soap and warm water for at least 20 seconds.  If soap and water are  not readily available, use an alcohol-based hand sanitizer with at least 60% alcohol.  If coughing or sneezing, cover your mouth and nose by coughing or sneezing into the elbow areas of your shirt or coat, into a tissue or into your sleeve (not your hands). Avoid shaking hands with others and consider head nods or verbal greetings only. Avoid touching your eyes, nose, or mouth with unwashed hands.  Avoid close contact with people who are sick. Avoid places or events with large numbers of people in one location, like concerts or sporting events. Carefully consider travel plans you have or are making. If you are planning any travel outside or inside the [place], visit the CDC's Travelers' Health webpage for the latest health notices. If you have some symptoms but not all symptoms, continue to monitor at home and seek medical attention if your symptoms worsen. If you are having a medical emergency, call 911.  HOME CARE Only take medications as instructed by your medical team. Drink plenty of fluids and get plenty of rest. A steam or ultrasonic humidifier can help if you have congestion.   GET HELP RIGHT AWAY IF YOU HAVE EMERGENCY WARNING SIGNS** FOR COVID-19. If you or someone is showing any of these signs seek emergency medical care immediately. Call 911 or proceed to your closest emergency facility if: You develop worsening high fever. Trouble breathing Bluish lips or face Persistent pain or pressure in the chest New confusion Inability to wake or stay awake You cough up blood. Your symptoms become more severe  **This list is not all possible symptoms. Contact your medical provider for any symptoms that are sever or concerning to you.  MAKE SURE YOU  Understand these instructions. Will watch your condition. Will get help right away if you are not doing well or get worse.  Your e-visit answers were reviewed by a board certified advanced clinical practitioner to complete your personal care  plan.  Depending on the condition, your plan could have included both over the counter or prescription medications.  If there is a problem please reply once you have received a response from your provider.  Your safety is important to [place].  If you have drug allergies check your prescription carefully.    You can use MyChart to ask questions about today's visit, request a non-urgent call back, or ask for a work or school excuse for 24 hours related to this e-Visit. If it has been greater than 24 hours you will need to follow up with your provider, or enter a new e-Visit to address those concerns. You will get an e-mail in the next two days asking about your experience.  I hope that your e-visit has been valuable and will speed your recovery. Thank you for using e-visits.  I provided 6 minutes of non face-to-face time during this encounter for chart review and documentation.

## 2021-07-26 ENCOUNTER — Telehealth: Payer: BC Managed Care – PPO | Admitting: Physician Assistant

## 2021-07-26 DIAGNOSIS — R058 Other specified cough: Secondary | ICD-10-CM

## 2021-07-26 NOTE — Progress Notes (Signed)
Based on what you shared with me, I feel your condition warrants further evaluation and I recommend that you be seen in person as you need a detailed heart and lung examination and assessment of oxygen status to make sure no further workup (chest x-ray, etc) is needed and proper treatment can be given.    NOTE: There will be NO CHARGE for this eVisit   If you are having a true medical emergency please call 911.      For an urgent face to face visit, [place] has six urgent care centers for your convenience:     [facility] [place] [facility] [place] Get Driving Directions [phone] [address] [place], [place] [address]    [facility]) Get Driving Directions [phone] [place], [place] [address]  [facility] [place] ([place]) Get Driving Directions [phone] [address] [place],  [place]  [address]  [facility] Get Driving Directions [phone] [address] [place], [place], [place] [address]   [facility] Get Driving Directions  [phone] [address] [place], [place] [address]   [facility] [place] Get Driving Directions [phone] [address], [place],  [address]  Your MyChart E-visit questionnaire answers were reviewed by a board certified advanced clinical practitioner to complete your personal care plan based on your specific symptoms.  Thank you for using e-Visits.

## 2021-07-29 ENCOUNTER — Encounter: Payer: Self-pay | Admitting: Family

## 2021-08-01 ENCOUNTER — Other Ambulatory Visit: Payer: Self-pay

## 2021-08-01 ENCOUNTER — Ambulatory Visit
Admission: EM | Admit: 2021-08-01 | Discharge: 2021-08-01 | Disposition: A | Discharge status: Home / Self Care | Payer: BC Managed Care – PPO | Attending: Emergency Medicine | Admitting: Emergency Medicine

## 2021-08-01 ENCOUNTER — Ambulatory Visit: Payer: BC Managed Care – PPO

## 2021-08-01 ENCOUNTER — Ambulatory Visit (INDEPENDENT_AMBULATORY_CARE_PROVIDER_SITE_OTHER): Payer: BC Managed Care – PPO

## 2021-08-01 ENCOUNTER — Encounter: Payer: Self-pay | Admitting: Emergency Medicine

## 2021-08-01 DIAGNOSIS — R051 Acute cough: Secondary | ICD-10-CM

## 2021-08-01 DIAGNOSIS — R062 Wheezing: Secondary | ICD-10-CM

## 2021-08-01 DIAGNOSIS — R0602 Shortness of breath: Secondary | ICD-10-CM | POA: Diagnosis not present

## 2021-08-01 DIAGNOSIS — R059 Cough, unspecified: Secondary | ICD-10-CM

## 2021-08-01 MED ORDER — HYDROCOD POLST-CPM POLST ER 10-8 MG/5ML PO SUER: 5.0000 mL | Freq: Two times a day (BID) | ORAL | 0 refills | Status: AC | PRN

## 2021-08-01 MED ORDER — BENZONATATE 100 MG PO CAPS: 100.0000 mg | ORAL_CAPSULE | Freq: Three times a day (TID) | ORAL | 0 refills | Status: AC | PRN

## 2021-08-01 NOTE — Discharge Instructions (Signed)
You can take 5 mL of Tussidex twice daily for the next 5 days. Take 2 Tessalon Perles with Mucinex DM at night before bed. Please rest for the next 3 days.

## 2021-08-01 NOTE — Telephone Encounter (Signed)
Please advise 

## 2021-08-01 NOTE — ED Triage Notes (Signed)
Pt presents with cough and fever x 2 weeks. Pt states she was treated with prednisone and Zpak and is not better.

## 2021-08-01 NOTE — Telephone Encounter (Signed)
Left message to return call. Mychart message sent to call in to office for available virtual appointment. Okay to schedule with any available provider if calling back in.

## 2021-08-01 NOTE — ED Provider Notes (Signed)
[facility]  ____________________________________________  Time seen: Approximately 10:41 AM  I have reviewed the triage vital signs and the nursing notes.   HISTORY  Chief Complaint Cough and Fever   Historian Patient     HPI Bridget Peters is a 50 y.o. female presents to the urgent care with cough for the past 2 weeks and low-grade fever for the past 2 to 3 days.  Patient reports that she recently finished azithromycin and prednisone and is not feeling better.  No current chest pain, chest tightness or shortness of breath.   Past Medical History:  Diagnosis Date   Allergy    Seasonal   Asthma    Complication of anesthesia    nausea and vomitting   Depression    Diabetes mellitus without complication ([place])    Family history of adverse reaction to anesthesia    nausea and vomitting   Hypertension    Hypothyroidism    Pneumonia    PONV (postoperative nausea and vomiting)    Shortness of breath    Sleep apnea    Thyroid disease      Immunizations up to date:  Yes.     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   Other headache syndrome 12/09/2019   Nonintractable headache 11/03/2019   Numbness 11/03/2019   Neck pain 11/03/2019   Right knee pain 10/22/2019   PMB (postmenopausal bleeding) 11/04/2018   Routine physical examination 05/20/2018   Vaginal bleeding 05/20/2018   Aortic atherosclerosis ([place]) 04/15/2018    Osteopenia 05/14/2017   OSA (obstructive sleep apnea) 04/24/2017   Elevated hemoglobin ([place]) 03/09/2017   Fatty liver 01/17/2017   Fatigue 01/01/2017   HTN (hypertension) 01/01/2017   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   Anxiety and depression 07/11/2016   Allergic reaction to vaccine 06/13/2016   Hypercholesteremia 04/26/2016   Screening mammogram, encounter for 07/14/2015   Hypothyroidism 07/14/2015   Diabetes mellitus type 2, controlled, without complications ([place]) 47/65/4650   Multiple environmental allergies 07/14/2015   Asthma, moderate persistent 07/14/2015    Past Surgical History:  Procedure Laterality Date   BREAST SURGERY  01/2013   Breast Reduction    COLONOSCOPY WITH PROPOFOL N/A 09/10/2020   Procedure: COLONOSCOPY WITH PROPOFOL;  Surgeon: Lucilla Lame, MD;  Location: [facility];  Service: Endoscopy;  Laterality: N/A;  priority 4   NASAL SINUS SURGERY     NASAL SINUS SURGERY  02/2014   REDUCTION MAMMAPLASTY Bilateral 2014   WISDOM TOOTH EXTRACTION     x4    Prior to Admission medications   Medication Sig Start Date End Date Taking? Authorizing Provider  albuterol (PROVENTIL) (2.5 MG/3ML) 0.083% nebulizer solution Take 3 mLs (2.5 mg total) by nebulization every 6 (six) hours as needed for wheezing or shortness of breath. 11/01/18  Yes Withrow, Elyse Jarvis,  FNP  albuterol (VENTOLIN HFA) 108 (90 Base) MCG/ACT inhaler INHALE 2 PUFFS INTO THE LUNGS EVERY 4 HOURS AS NEEDED FOR WHEEZING OR SHORTNESS OF BREATH. 07/01/21  Yes Martyn Ehrich, NP  amLODipine (NORVASC) 2.5 MG tablet TAKE 1 TABLET BY MOUTH EVERY DAY 07/05/21  Yes Arnett, Yvetta Coder, FNP  benzonatate (TESSALON) 100 MG capsule Take 1 capsule (100 mg total) by mouth 3 (three) times daily as needed for up to 5 days for cough. 08/01/21 08/06/21 Yes Vallarie Mare M, PA-C  cetirizine (ZYRTEC ALLERGY) 10 MG tablet Take 1 tablet (10 mg total) by mouth daily. 07/01/21  Yes Martyn Ehrich, NP   chlorpheniramine-HYDROcodone [facility] PENNKINETIC ER) 10-8 MG/5ML SUER Take 5 mLs by mouth every 12 (twelve) hours as needed for up to 5 days for cough. 08/01/21 08/06/21 Yes Vallarie Mare M, PA-C  EPIPEN 2-PAK 0.3 MG/0.3ML SOAJ injection  05/04/17  Yes [provider]  fexofenadine (ALLEGRA) 180 MG tablet Take 1 tablet (180 mg total) by mouth daily. 07/01/21  Yes Martyn Ehrich, NP  fluticasone (FLONASE) 50 MCG/ACT nasal spray Place 2 sprays into both nostrils daily. 07/01/21  Yes Martyn Ehrich, NP  gabapentin (NEURONTIN) 100 MG capsule Take 1 capsule (100 mg total) by mouth at bedtime. 12/14/20  Yes Burnard Hawthorne, FNP  levothyroxine (SYNTHROID) 75 MCG tablet Take 1 tablet (75 mcg total) by mouth daily. 06/10/21  Yes Burnard Hawthorne, FNP  montelukast (SINGULAIR) 10 MG tablet TAKE 1 TABLET BY MOUTH EVERY DAY 06/05/21  Yes Martyn Ehrich, NP  naproxen (NAPROSYN) 500 MG tablet Take 1 tablet (500 mg total) by mouth 2 (two) times daily with a meal. 06/06/20  Yes McVey, Gelene Mink, PA-C  NUCALA 100 MG/ML SOAJ INJECT 1 SYRINGE SUBCUTANEOUSLY EVERY FOUR WEEKS 11/10/20 11/10/21 Yes Pablo Lawrence  pantoprazole (PROTONIX) 40 MG tablet Take 1 tablet (40 mg total) by mouth daily. 05/10/20  Yes Martyn Ehrich, NP  RESTASIS MULTIDOSE 0.05 % ophthalmic emulsion  04/13/20  Yes [provider]  sertraline (ZOLOFT) 50 MG tablet Take 1 tablet (50 mg total) by mouth at bedtime. 10/14/20  Yes Arnett, Yvetta Coder, FNP  Tiotropium Bromide Monohydrate (SPIRIVA RESPIMAT) 1.25 MCG/ACT AERS Inhale 2 puffs into the lungs daily. 07/01/21  Yes Martyn Ehrich, NP  topiramate (TOPAMAX) 100 MG tablet TAKE 1 TABLET BY MOUTH TWICE DAILY 06/08/21 06/08/22 Yes Dohmeier, Asencion Partridge, MD  predniSONE (DELTASONE) 20 MG tablet Take 1 tablet (20 mg total) by mouth daily with breakfast. 07/18/21   Flora Lipps, MD    Allergies Shellfish allergy, Iodinated diagnostic agents, Milk-related compounds, Tamiflu  [oseltamivir phosphate], Wheat bran, Covid-19 (adenovirus) vaccine, Acetazolamide, and Xolair [omalizumab]  Family History  Problem Relation Age of Onset   Hypertension Mother    Endometrial cancer Mother 16   Hypertension Father    Colon polyps Father    Cancer Maternal Aunt        Breast Cancer   Breast cancer Maternal Aunt    Breast cancer Paternal Aunt        Breast Cancer   Cancer Maternal Grandmother 44       Bladder Cancer   Stroke Maternal Grandmother    Breast cancer Maternal Grandmother 30   Cancer Maternal Grandfather 68       Esophagus Cancer   Colon cancer Neg Hx    Ovarian cancer Neg Hx     Social History Social History   Tobacco Use   Smoking status: Never  Smokeless tobacco: Never  Vaping Use   Vaping Use: Never used  Substance Use Topics   Alcohol use: Yes    Alcohol/week: 0.0 standard drinks    Comment: occasionally   Drug use: No     Review of Systems  Constitutional: Patient has low grade fever.  Eyes:  No discharge ENT: No upper respiratory complaints. Respiratory: Patient has cough.  Gastrointestinal:   No nausea, no vomiting.  No diarrhea.  No constipation. Musculoskeletal: Negative for musculoskeletal pain. Skin: Negative for rash, abrasions, lacerations, ecchymosis.    ____________________________________________   PHYSICAL EXAM:  VITAL SIGNS: ED Triage Vitals  Enc Vitals Group     BP 08/01/21 1029 135/85     Pulse Rate 08/01/21 1029 86     Resp 08/01/21 1029 16     Temp 08/01/21 1029 98.2 F (36.8 C)     Temp Source 08/01/21 1029 Oral     SpO2 08/01/21 1029 95 %     Weight --      Height --      Head Circumference --      Peak Flow --      Pain Score 08/01/21 1030 4     Pain Loc --      Pain Edu? --      Excl. in [place]? --      Constitutional: Alert and oriented. Patient is lying supine. Eyes: Conjunctivae are normal. PERRL. EOMI. Head: Atraumatic. ENT:      Ears: Tympanic membranes are mildly injected with mild  effusion bilaterally.       Nose: No congestion/rhinnorhea.      Mouth/Throat: Mucous membranes are moist. Posterior pharynx is mildly erythematous.  Hematological/Lymphatic/Immunilogical: No cervical lymphadenopathy.  Cardiovascular: Normal rate, regular rhythm. Normal S1 and S2.  Good peripheral circulation. Respiratory: Normal respiratory effort without tachypnea or retractions. Lungs CTAB. Good air entry to the bases with no decreased or absent breath sounds. Gastrointestinal: Bowel sounds 4 quadrants. Soft and nontender to palpation. No guarding or rigidity. No palpable masses. No distention. No CVA tenderness. Musculoskeletal: Full range of motion to all extremities. No gross deformities appreciated. Neurologic:  Normal speech and language. No gross focal neurologic deficits are appreciated.  Skin:  Skin is warm, dry and intact. No rash noted. Psychiatric: Mood and affect are normal. Speech and behavior are normal. Patient exhibits appropriate insight and judgement.  ____________________________________________   LABS (all labs ordered are listed, but only abnormal results are displayed)  Labs Reviewed - No data to display ____________________________________________  EKG   ____________________________________________  RADIOLOGY Unk Pinto, personally viewed and evaluated these images (plain radiographs) as part of my medical decision making, as well as reviewing the written report by the radiologist.    DG Chest 2 View  Result Date: 08/01/2021 CLINICAL DATA:  Cough, shortness of breath and wheezing EXAM: CHEST - 2 VIEW COMPARISON:  None. FINDINGS: The lungs are clear and negative for focal airspace consolidation, pulmonary edema or suspicious pulmonary nodule. No pleural effusion or pneumothorax. Cardiac and mediastinal contours are within normal limits. No acute fracture or lytic or blastic osseous lesions. The visualized upper abdominal bowel gas pattern is  unremarkable. IMPRESSION: No active cardiopulmonary disease. Electronically Signed   By: Jacqulynn Cadet M.D.   On: 08/01/2021 11:07    ____________________________________________    PROCEDURES  Procedure(s) performed:     Procedures     Medications - No data to display   ____________________________________________   INITIAL IMPRESSION / ASSESSMENT AND  PLAN / ED COURSE  Pertinent labs & imaging results that were available during my care of the patient were reviewed by me and considered in my medical decision making (see chart for details).      Assessment and Plan: Cough:  50 year old female presents to the urgent care with cough that occurred for the past 2 weeks.  Patient had sporadic cough initially for the first 10 days and was started on azithromycin prednisone and she reports that she took those medications as directed.  Patient states that over the past 2 to 3 days her cough has worsened in intensity and she has had low-grade fever.  Suspect new viral upper respiratory tract infection.  No consolidations, opacities or infiltrates on chest x-ray.  We will treat patient with Tussionex, Tessalon Perles and Mucinex DM and a work note for the next 3 days.  Patient has already tested negative for COVID-19 and does not wish additional testing.    ____________________________________________  FINAL CLINICAL IMPRESSION(S) / ED DIAGNOSES  Final diagnoses:  Acute cough      NEW MEDICATIONS STARTED DURING THIS VISIT:  ED Discharge Orders          Ordered    chlorpheniramine-HYDROcodone (TUSSIONEX PENNKINETIC ER) 10-8 MG/5ML SUER  Every 12 hours PRN        08/01/21 1115    benzonatate (TESSALON) 100 MG capsule  3 times daily PRN        08/01/21 1115                This chart was dictated using voice recognition software/Dragon. Despite best efforts to proofread, errors can occur which can change the meaning. Any change was purely unintentional.      Lannie Fields, PA-C 08/01/21 1120

## 2021-08-03 MED ORDER — PREDNISONE 20 MG PO TABS: 20.0000 mg | ORAL_TABLET | Freq: Every day | ORAL | 0 refills | Status: DC

## 2021-08-03 NOTE — Telephone Encounter (Signed)
Spoke with pt  Complains of cough, wheeze 2 weeks. No chest tightness, CP, leg swelling.  SOB during cough if 'coughs a lot'. Resolves with rest.  Can walk about 10 steps and then will feel sob.   This feels normal for her when she has a flare.  Home covid test negative, no PCR test.   Fever 101 ,3 days ago , since resolved. Sa02 at home 91% when walking and sitting. HR 80.   She is not wearing Cipap as couldn't tolerate cipap and felt like 'couldnt breathe'.   Compliant with spirvia , nebulizer every couple of hours, nucula ( through allergy), zyrtec.   Tessalon perles not helpful. Taken tussionex qhs.   No dvt, pe.  No cancer diagnosis recent hospitalization, mobilization or surgery  CXR no acute findings 08/02/21 Echocardiogram 02/3645 normal systolic function.  Estimated ejection fraction is 60 to 65%.  Mild regurgitation mitral valve.grade 1 diastolic dysfunction.  Plan:  Low risk wells criteria for PE. She declines CT angiogram to evaluate for PE.  Consider covid, flu test at alpha diagnosits.   Advised if SOB worsens,  sa02 if less < 90 , advised if less than 90% to go to ED.   Consider another sleep study with pulmonology, or oral appliance.   Restart prednisone 20mg  x 10 days and she will contact pulmonology for follow up on going for asthma

## 2021-08-03 NOTE — Telephone Encounter (Signed)
LMTCB

## 2021-08-08 ENCOUNTER — Encounter: Payer: Self-pay | Admitting: Family

## 2021-08-08 NOTE — Telephone Encounter (Signed)
Given the persistence of the cough and her describe severity I would suggest reevaluation.  This could be with a virtual visit with any available provider or a MyChart virtual visit.

## 2021-08-08 NOTE — Telephone Encounter (Signed)
Patient was seen by urgent care. Please advise does Patient need to be re-evaluated for continued cough?

## 2021-08-11 DIAGNOSIS — D3132 Benign neoplasm of left choroid: Secondary | ICD-10-CM | POA: Diagnosis not present

## 2021-08-11 DIAGNOSIS — G932 Benign intracranial hypertension: Secondary | ICD-10-CM | POA: Diagnosis not present

## 2021-08-11 DIAGNOSIS — Z7984 Long term (current) use of oral hypoglycemic drugs: Secondary | ICD-10-CM | POA: Diagnosis not present

## 2021-08-11 DIAGNOSIS — E119 Type 2 diabetes mellitus without complications: Secondary | ICD-10-CM | POA: Diagnosis not present

## 2021-08-11 LAB — HM DIABETES EYE EXAM

## 2021-08-31 ENCOUNTER — Other Ambulatory Visit (INDEPENDENT_AMBULATORY_CARE_PROVIDER_SITE_OTHER): Payer: BC Managed Care – PPO

## 2021-08-31 ENCOUNTER — Other Ambulatory Visit: Payer: Self-pay

## 2021-08-31 DIAGNOSIS — I10 Essential (primary) hypertension: Secondary | ICD-10-CM | POA: Diagnosis not present

## 2021-08-31 DIAGNOSIS — E7800 Pure hypercholesterolemia, unspecified: Secondary | ICD-10-CM | POA: Diagnosis not present

## 2021-08-31 DIAGNOSIS — E119 Type 2 diabetes mellitus without complications: Secondary | ICD-10-CM

## 2021-08-31 DIAGNOSIS — R5383 Other fatigue: Secondary | ICD-10-CM

## 2021-08-31 DIAGNOSIS — E039 Hypothyroidism, unspecified: Secondary | ICD-10-CM | POA: Diagnosis not present

## 2021-08-31 LAB — COMPREHENSIVE METABOLIC PANEL
ALT: 25 U/L (ref 0–35)
AST: 21 U/L (ref 0–37)
Albumin: 4.5 g/dL (ref 3.5–5.2)
Alkaline Phosphatase: 79 U/L (ref 39–117)
BUN: 20 mg/dL (ref 6–23)
CO2: 25 mEq/L (ref 19–32)
Calcium: 8.7 mg/dL (ref 8.4–10.5)
Chloride: 110 mEq/L (ref 96–112)
Creatinine, Ser: 0.75 mg/dL (ref 0.40–1.20)
GFR: 92.7 mL/min (ref >60.00)
Glucose, Bld: 129 mg/dL — ABNORMAL HIGH (ref 70–99)
Potassium: 4 mEq/L (ref 3.5–5.1)
Sodium: 141 mEq/L (ref 135–145)
Total Bilirubin: 0.4 mg/dL (ref 0.2–1.2)
Total Protein: 6.5 g/dL (ref 6.0–8.3)

## 2021-08-31 LAB — LIPID PANEL
Cholesterol: 233 mg/dL — ABNORMAL HIGH (ref 0–200)
HDL: 76 mg/dL (ref >39.00)
LDL Cholesterol: 134 mg/dL — ABNORMAL HIGH (ref 0–99)
NonHDL: 157.2
Total CHOL/HDL Ratio: 3
Triglycerides: 116 mg/dL (ref 0.0–149.0)
VLDL: 23.2 mg/dL (ref 0.0–40.0)

## 2021-08-31 LAB — CBC WITH DIFFERENTIAL/PLATELET
Basophils Absolute: 0 10*3/uL (ref 0.0–0.1)
Basophils Relative: 0.2 % (ref 0.0–3.0)
Eosinophils Absolute: 0 10*3/uL (ref 0.0–0.7)
Eosinophils Relative: 0.8 % (ref 0.0–5.0)
HCT: 38.9 % (ref 36.0–46.0)
Hemoglobin: 13.2 g/dL (ref 12.0–15.0)
Lymphocytes Relative: 35.7 % (ref 12.0–46.0)
Lymphs Abs: 1.7 10*3/uL (ref 0.7–4.0)
MCHC: 33.9 g/dL (ref 30.0–36.0)
MCV: 86.7 fl (ref 78.0–100.0)
Monocytes Absolute: 0.4 10*3/uL (ref 0.1–1.0)
Monocytes Relative: 8.6 % (ref 3.0–12.0)
Neutro Abs: 2.6 10*3/uL (ref 1.4–7.7)
Neutrophils Relative %: 54.7 % (ref 43.0–77.0)
Platelets: 210 10*3/uL (ref 150.0–400.0)
RBC: 4.49 Mil/uL (ref 3.87–5.11)
RDW: 13.1 % (ref 11.5–15.5)
WBC: 4.7 10*3/uL (ref 4.0–10.5)

## 2021-08-31 LAB — MICROALBUMIN / CREATININE URINE RATIO
Creatinine,U: 33.9 mg/dL
Microalb Creat Ratio: 2.1 mg/g (ref 0.0–30.0)
Microalb, Ur: <0.7 mg/dL (ref 0.0–1.9)

## 2021-08-31 LAB — VITAMIN D 25 HYDROXY (VIT D DEFICIENCY, FRACTURES): VITD: 36.46 ng/mL (ref 30.00–100.00)

## 2021-08-31 LAB — TSH: TSH: 1.76 u[IU]/mL (ref 0.35–5.50)

## 2021-08-31 LAB — HEMOGLOBIN A1C: Hgb A1c MFr Bld: 6.1 % (ref 4.6–6.5)

## 2021-09-05 ENCOUNTER — Encounter: Payer: BC Managed Care – PPO | Admitting: Family

## 2021-09-14 ENCOUNTER — Other Ambulatory Visit: Payer: Self-pay | Admitting: Primary Care

## 2021-10-01 ENCOUNTER — Other Ambulatory Visit: Payer: Self-pay | Admitting: Family

## 2021-10-01 DIAGNOSIS — E039 Hypothyroidism, unspecified: Secondary | ICD-10-CM

## 2021-10-21 ENCOUNTER — Encounter: Payer: BC Managed Care – PPO | Admitting: Family

## 2021-10-22 ENCOUNTER — Other Ambulatory Visit: Payer: Self-pay | Admitting: Family

## 2021-11-16 ENCOUNTER — Other Ambulatory Visit: Payer: Self-pay

## 2021-11-16 ENCOUNTER — Ambulatory Visit (INDEPENDENT_AMBULATORY_CARE_PROVIDER_SITE_OTHER): Payer: BC Managed Care – PPO | Admitting: Family

## 2021-11-16 ENCOUNTER — Encounter: Payer: Self-pay | Admitting: Family

## 2021-11-16 ENCOUNTER — Other Ambulatory Visit (HOSPITAL_COMMUNITY)
Admission: RE | Admit: 2021-11-16 | Discharge: 2021-11-16 | Disposition: A | Discharge status: Home / Self Care | Payer: BC Managed Care – PPO | Source: Ambulatory Visit | Attending: Family | Admitting: Family

## 2021-11-16 VITALS — BP 136/84 | HR 77 | Temp 97.7°F | Ht 62.0 in | Wt 153.4 lb

## 2021-11-16 DIAGNOSIS — Z0000 Encounter for general adult medical examination without abnormal findings: Secondary | ICD-10-CM | POA: Diagnosis not present

## 2021-11-16 DIAGNOSIS — I10 Essential (primary) hypertension: Secondary | ICD-10-CM

## 2021-11-16 DIAGNOSIS — Z23 Encounter for immunization: Secondary | ICD-10-CM | POA: Diagnosis not present

## 2021-11-16 DIAGNOSIS — F419 Anxiety disorder, unspecified: Secondary | ICD-10-CM | POA: Diagnosis not present

## 2021-11-16 DIAGNOSIS — F32A Depression, unspecified: Secondary | ICD-10-CM

## 2021-11-16 MED ORDER — BUPROPION HCL ER (XL) 150 MG PO TB24: 150.0000 mg | ORAL_TABLET | Freq: Every day | ORAL | 1 refills | Status: DC

## 2021-11-16 NOTE — Addendum Note (Signed)
Addended by: Cheri Rous E on: 11/16/2021 11:19 AM   Modules accepted: Orders

## 2021-11-16 NOTE — Assessment & Plan Note (Signed)
Chronic, stable. BP at home under excellent control. Continue amlodipine 2.5mg  and she will monitor BP at home.

## 2021-11-16 NOTE — Addendum Note (Signed)
Addended by: Cheri Rous E on: 11/16/2021 11:24 AM   Modules accepted: Orders

## 2021-11-16 NOTE — Patient Instructions (Addendum)
Please fax mammogram to our office.    As discussed, please go ahead and wean from zoloft 50mg  by taking every other day for one week, then stop.  After, you may start Wellbutrin 150 mg taken in the morning.   We discussed today starting medication called Wellbutrin.  As also discussed, you must limit alcohol on this medication as alcohol and Wellbutrin together may increase your risk for seizure.  You may drink no more than 1 alcoholic beverage on this medication.  A standard drink is 12 ounces of regular beer, which is usually about 5% alcohol OR 5 ounces of wine, which is typically about 12% alcohol OR   1.5 ounces of distilled spirits, which is about 40% alcohol   Monitor for increased anxiety, irritability.  Health Maintenance for Postmenopausal Women Menopause is a normal process in which your ability to get pregnant comes to an end. This process happens slowly over many months or years, usually between the ages of 72 and 46. Menopause is complete when you have missed your menstrual period for 12 months. It is important to talk with your health care provider about some of the most common conditions that affect women after menopause (postmenopausal women). These include heart disease, cancer, and bone loss (osteoporosis). Adopting a healthy lifestyle and getting preventive care can help to promote your health and wellness. The actions you take can also lower your chances of developing some of these common conditions. What are the signs and symptoms of menopause? During menopause, you may have the following symptoms: Hot flashes. These can be moderate or severe. Night sweats. Decrease in sex drive. Mood swings. Headaches. Tiredness (fatigue). Irritability. Memory problems. Problems falling asleep or staying asleep. Talk with your health care provider about treatment options for your symptoms. Do I need hormone replacement therapy? Hormone replacement therapy is effective in  treating symptoms that are caused by menopause, such as hot flashes and night sweats. Hormone replacement carries certain risks, especially as you become older. If you are thinking about using estrogen or estrogen with progestin, discuss the benefits and risks with your health care provider. How can I reduce my risk for heart disease and stroke? The risk of heart disease, heart attack, and stroke increases as you age. One of the causes may be a change in the body's hormones during menopause. This can affect how your body uses dietary fats, triglycerides, and cholesterol. Heart attack and stroke are medical emergencies. There are many things that you can do to help prevent heart disease and stroke. Watch your blood pressure High blood pressure causes heart disease and increases the risk of stroke. This is more likely to develop in people who have high blood pressure readings or are overweight. Have your blood pressure checked: Every 3-5 years if you are 1-57 years of age. Every year if you are 26 years old or older. Eat a healthy diet  Eat a diet that includes plenty of vegetables, fruits, low-fat dairy products, and lean protein. Do not eat a lot of foods that are high in solid fats, added sugars, or sodium. Get regular exercise Get regular exercise. This is one of the most important things you can do for your health. Most adults should: Try to exercise for at least 150 minutes each week. The exercise should increase your heart rate and make you sweat (moderate-intensity exercise). Try to do strengthening exercises at least twice each week. Do these in addition to the moderate-intensity exercise. Spend less time sitting. Even light  physical activity can be beneficial. Other tips Work with your health care provider to achieve or maintain a healthy weight. Do not use any products that contain nicotine or tobacco. These products include cigarettes, chewing tobacco, and vaping devices, such as  e-cigarettes. If you need help quitting, ask your health care provider. Know your numbers. Ask your health care provider to check your cholesterol and your blood sugar (glucose). Continue to have your blood tested as directed by your health care provider. Do I need screening for cancer? Depending on your health history and family history, you may need to have cancer screenings at different stages of your life. This may include screening for: Breast cancer. Cervical cancer. Lung cancer. Colorectal cancer. What is my risk for osteoporosis? After menopause, you may be at increased risk for osteoporosis. Osteoporosis is a condition in which bone destruction happens more quickly than new bone creation. To help prevent osteoporosis or the bone fractures that can happen because of osteoporosis, you may take the following actions: If you are 28-30 years old, get at least 1,000 mg of calcium and at least 600 international units (IU) of vitamin D per day. If you are older than age 72 but younger than age 52, get at least 1,200 mg of calcium and at least 600 international units (IU) of vitamin D per day. If you are older than age 28, get at least 1,200 mg of calcium and at least 800 international units (IU) of vitamin D per day. Smoking and drinking excessive alcohol increase the risk of osteoporosis. Eat foods that are rich in calcium and vitamin D, and do weight-bearing exercises several times each week as directed by your health care provider. How does menopause affect my mental health? Depression may occur at any age, but it is more common as you become older. Common symptoms of depression include: Feeling depressed. Changes in sleep patterns. Changes in appetite or eating patterns. Feeling an overall lack of motivation or enjoyment of activities that you previously enjoyed. Frequent crying spells. Talk with your health care provider if you think that you are experiencing any of these  symptoms. General instructions See your health care provider for regular wellness exams and vaccines. This may include: Scheduling regular health, dental, and eye exams. Getting and maintaining your vaccines. These include: Influenza vaccine. Get this vaccine each year before the flu season begins. Pneumonia vaccine. Shingles vaccine. Tetanus, diphtheria, and pertussis (Tdap) booster vaccine. Your health care provider may also recommend other immunizations. Tell your health care provider if you have ever been abused or do not feel safe at home. Summary Menopause is a normal process in which your ability to get pregnant comes to an end. This condition causes hot flashes, night sweats, decreased interest in sex, mood swings, headaches, or lack of sleep. Treatment for this condition may include hormone replacement therapy. Take actions to keep yourself healthy, including exercising regularly, eating a healthy diet, watching your weight, and checking your blood pressure and blood sugar levels. Get screened for cancer and depression. Make sure that you are up to date with all your vaccines. This information is not intended to replace advice given to you by your health care provider. Make sure you discuss any questions you have with your health care provider. Document Revised: 02/14/2021 Document Reviewed: 02/14/2021 Elsevier Patient Education  [place].

## 2021-11-16 NOTE — Assessment & Plan Note (Signed)
Patient declines clinical breast exam.  She will fax over her mammogram report which is up-to-date as of September of last year.  She declines referral to dermatology for skin exam.

## 2021-11-16 NOTE — Assessment & Plan Note (Signed)
Symptoms overall very well controlled.  Long discussion as it relates to decreased libido and unfortunately no medication for that.  We did discuss off label that Wellbutrin may be more energizing for her and this may impact libido.Counseled on no more than 1-2 alcohol beverages while on wellbutrin.   Patient preferred to come off Zoloft as did not feel she needed for anxiety or depression.  She is very interested in trial of Wellbutrin. Start wellbutrin 150mg . Close follow up.

## 2021-11-16 NOTE — Progress Notes (Signed)
Subjective:    Patient ID: Bridget Peters, female    DOB: 04/28/1971, 51 y.o.   MRN: [id number]  CC: [name] is a 51 y.o. female who presents today for physical exam and follow up    HPI: Overall feels well today.    Hypertension-compliant with amlodipine 2.5 mg. BP at home 110/60. No cp.   Anxiety and depression-compliant with Zoloft 50 mg. She has no longer thinks she needs medication for anxiety. She is happy at work. Denies depression. She complains of decreased libido and asks if medication that may help. No ho seizure, anorexia or bulmia. No h/o alcohol abuse     H/o prediabetes  No new skin lesions or h/o skin cancer  Colorectal Cancer Screening: UTD , 09/10/2020, repeat in 5 years Breast Cancer Screening: Mammogram UTD reported  Cervical Cancer Screening: due Bone Health screening/DEXA for 65+: No increased fracture risk. Defer screening at this time.  Lung Cancer Screening: Doesn't have 20 year pack year history and age > 74 years yo 13 years       Tetanus - due        Pneumococcal - Complete  Labs: Screening labs done prior Exercise: Gets regular exercise.   Alcohol use:  occasional Smoking/tobacco use: Nonsmoker.     HISTORY:  Past Medical History:  Diagnosis Date   Allergy    Seasonal   Asthma    Complication of anesthesia    nausea and vomitting   Depression    Diabetes mellitus without complication ([place])    Family history of adverse reaction to anesthesia    nausea and vomitting   Hypertension    Hypothyroidism    Pneumonia    PONV (postoperative nausea and vomiting)    Shortness of breath    Sleep apnea    Thyroid disease     Past Surgical History:  Procedure Laterality Date   BREAST SURGERY  01/2013   Breast Reduction    COLONOSCOPY WITH PROPOFOL N/A 09/10/2020   Procedure: COLONOSCOPY WITH PROPOFOL;  Surgeon: Lucilla Lame, MD;  Location: [facility];  Service: Endoscopy;  Laterality: N/A;  priority 4   NASAL SINUS  SURGERY     NASAL SINUS SURGERY  02/2014   REDUCTION MAMMAPLASTY Bilateral 2014   WISDOM TOOTH EXTRACTION     x4   Family History  Problem Relation Age of Onset   Hypertension Mother    Endometrial cancer Mother 22   Hypertension Father    Colon polyps Father    Cancer Maternal Aunt        Breast Cancer   Breast cancer Maternal Aunt    Breast cancer Paternal Aunt        Breast Cancer   Cancer Maternal Grandmother 45       Bladder Cancer   Stroke Maternal Grandmother    Breast cancer Maternal Grandmother 42   Cancer Maternal Grandfather 68       Esophagus Cancer   Colon cancer Neg Hx    Ovarian cancer Neg Hx       ALLERGIES: Shellfish allergy, Iodinated contrast media, Milk-related compounds, Tamiflu [oseltamivir phosphate], Wheat bran, Covid-19 (adenovirus) vaccine, Acetazolamide, and Xolair [omalizumab]  Current Outpatient Medications on File Prior to Visit  Medication Sig Dispense Refill   albuterol (PROVENTIL) (2.5 MG/3ML) 0.083% nebulizer solution Take 3 mLs (2.5 mg total) by nebulization every 6 (six) hours as needed for wheezing or shortness of breath. 75 mL 12   albuterol (VENTOLIN HFA) 108 (  90 Base) MCG/ACT inhaler INHALE 2 PUFFS INTO THE LUNGS EVERY 4 HOURS AS NEEDED FOR WHEEZING OR SHORTNESS OF BREATH. 18 g 3   amLODipine (NORVASC) 2.5 MG tablet TAKE 1 TABLET BY MOUTH EVERY DAY 90 tablet 1   cetirizine (ZYRTEC ALLERGY) 10 MG tablet Take 1 tablet (10 mg total) by mouth daily. 30 tablet 11   EPIPEN 2-PAK 0.3 MG/0.3ML SOAJ injection   11   fexofenadine (ALLEGRA) 180 MG tablet Take 1 tablet (180 mg total) by mouth daily. 30 tablet 11   fluticasone (FLONASE) 50 MCG/ACT nasal spray Place 2 sprays into both nostrils daily. 16 g 11   gabapentin (NEURONTIN) 100 MG capsule Take 1 capsule (100 mg total) by mouth at bedtime. 90 capsule 3   levothyroxine (SYNTHROID) 75 MCG tablet TAKE 1 TABLET BY MOUTH EVERY DAY 90 tablet 1   montelukast (SINGULAIR) 10 MG tablet TAKE 1 TABLET BY  MOUTH EVERY DAY 30 tablet 5   naproxen (NAPROSYN) 500 MG tablet Take 1 tablet (500 mg total) by mouth 2 (two) times daily with a meal. 30 tablet 0   predniSONE (DELTASONE) 20 MG tablet Take 1 tablet (20 mg total) by mouth daily with breakfast. 10 tablet 0   Tiotropium Bromide Monohydrate (SPIRIVA RESPIMAT) 1.25 MCG/ACT AERS Inhale 2 puffs into the lungs daily. 4 g 11   topiramate (TOPAMAX) 100 MG tablet TAKE 1 TABLET BY MOUTH TWICE DAILY 180 tablet 3   Current Facility-Administered Medications on File Prior to Visit  Medication Dose Route Frequency Provider Last Rate Last Admin   Mepolizumab SOLR 100 mg  100 mg Subcutaneous Q28 days Flora Lipps, MD   100 mg at 08/03/17 4431    Social History   Tobacco Use   Smoking status: Never   Smokeless tobacco: Never  Vaping Use   Vaping Use: Never used  Substance Use Topics   Alcohol use: Yes    Alcohol/week: 0.0 standard drinks    Comment: occasionally   Drug use: No    Review of Systems  Constitutional:  Negative for chills, fever and unexpected weight change.  HENT:  Negative for congestion.   Respiratory:  Negative for cough.   Cardiovascular:  Negative for chest pain, palpitations and leg swelling.  Gastrointestinal:  Negative for nausea and vomiting.  Musculoskeletal:  Negative for arthralgias and myalgias.  Skin:  Negative for rash.  Neurological:  Negative for headaches.  Hematological:  Negative for adenopathy.  Psychiatric/Behavioral:  Negative for confusion. The patient is not nervous/anxious.      Objective:    BP 136/84 (BP Location: Left Arm, Patient Position: Sitting, Cuff Size: Normal)    Pulse 77    Temp 97.7 F (36.5 C) (Oral)    Ht 5\' 2"  (1.575 m)    Wt 153 lb 6.4 oz (69.6 kg)    SpO2 97%    BMI 28.06 kg/m   BP Readings from Last 3 Encounters:  11/16/21 136/84  08/01/21 135/85  07/06/21 122/81   Wt Readings from Last 3 Encounters:  11/16/21 153 lb 6.4 oz (69.6 kg)  07/06/21 155 lb 6.4 oz (70.5 kg)  07/01/21  153 lb 12.8 oz (69.8 kg)    Physical Exam Vitals reviewed.  Constitutional:      Appearance: Normal appearance. She is well-developed.  Eyes:     Conjunctiva/sclera: Conjunctivae normal.  Neck:     Thyroid: No thyroid mass or thyromegaly.  Cardiovascular:     Rate and Rhythm: Normal rate and regular rhythm.  Pulses: Normal pulses.     Heart sounds: Normal heart sounds.  Pulmonary:     Effort: Pulmonary effort is normal.     Breath sounds: Normal breath sounds. No wheezing, rhonchi or rales.  Abdominal:     General: Bowel sounds are normal. There is no distension.     Palpations: Abdomen is soft. Abdomen is not rigid. There is no fluid wave or mass.     Tenderness: There is no abdominal tenderness. There is no guarding or rebound.  Lymphadenopathy:     Head:     Right side of head: No submental, submandibular, tonsillar, preauricular, posterior auricular or occipital adenopathy.     Left side of head: No submental, submandibular, tonsillar, preauricular, posterior auricular or occipital adenopathy.     Cervical: No cervical adenopathy.  Skin:    General: Skin is warm and dry.  Neurological:     Mental Status: She is alert.  Psychiatric:        Speech: Speech normal.        Behavior: Behavior normal.        Thought Content: Thought content normal.       Assessment & Plan:   Problem List Items Addressed This Visit       Cardiovascular and Mediastinum   HTN (hypertension)    Chronic, stable. BP at home under excellent control. Continue amlodipine 2.5mg  and she will monitor BP at home.         Other   Anxiety and depression - Primary    Symptoms overall very well controlled.  Long discussion as it relates to decreased libido and unfortunately no medication for that.  We did discuss off label that Wellbutrin may be more energizing for her and this may impact libido.Counseled on no more than 1-2 alcohol beverages while on wellbutrin.   Patient preferred to come off  Zoloft as did not feel she needed for anxiety or depression.  She is very interested in trial of Wellbutrin. Start wellbutrin 150mg . Close follow up.       Relevant Medications   buPROPion (WELLBUTRIN XL) 150 MG 24 hr tablet   Routine physical examination    Patient declines clinical breast exam.  She will fax over her mammogram report which is up-to-date as of September of last year.  She declines referral to dermatology for skin exam.        I have discontinued Vici E. Damas's Restasis MultiDose, pantoprazole, and sertraline. I am also having her start on buPROPion. Additionally, I am having her maintain her EpiPen 2-Pak, albuterol, naproxen, gabapentin, topiramate, Spiriva Respimat, fexofenadine, fluticasone, cetirizine, albuterol, predniSONE, montelukast, levothyroxine, and amLODipine. We will continue to administer mepolizumab.   Meds ordered this encounter  Medications   buPROPion (WELLBUTRIN XL) 150 MG 24 hr tablet    Sig: Take 1 tablet (150 mg total) by mouth daily.    Dispense:  90 tablet    Refill:  1    Order Specific Question:   Supervising Provider    Answer:   Crecencio Mc [[id number]]    Return precautions given.   Risks, benefits, and alternatives of the medications and treatment plan prescribed today were discussed, and patient expressed understanding.   Education regarding symptom management and diagnosis given to patient on AVS.   Continue to follow with Burnard Hawthorne, FNP for routine health maintenance.   Rainy E Mancusi and I agreed with plan.   Mable Paris, FNP

## 2021-11-17 LAB — CYTOLOGY - PAP
Comment: NEGATIVE
Diagnosis: NEGATIVE
High risk HPV: NEGATIVE

## 2021-12-01 ENCOUNTER — Other Ambulatory Visit: Payer: Self-pay | Admitting: Family

## 2021-12-01 DIAGNOSIS — M542 Cervicalgia: Secondary | ICD-10-CM

## 2022-01-03 ENCOUNTER — Encounter: Payer: Self-pay | Admitting: Family

## 2022-01-04 ENCOUNTER — Telehealth: Payer: Self-pay

## 2022-01-04 NOTE — Telephone Encounter (Signed)
LMTCB to schedule appt to come into office to be seen ?

## 2022-01-04 NOTE — Telephone Encounter (Signed)
LMTCB office to schedule appt to come in to be seen ?

## 2022-01-17 ENCOUNTER — Telehealth: Payer: BC Managed Care – PPO | Admitting: Family

## 2022-01-26 ENCOUNTER — Telehealth: Payer: Self-pay

## 2022-01-26 NOTE — Telephone Encounter (Signed)
Yes we can send in prescription

## 2022-01-26 NOTE — Telephone Encounter (Signed)
Patient would like our office to take over Nucala injections. PA is needed.  ?Pharmacy team, does patient need to complete new enrollment form since our office did not originally  prescribe this medication? ? ? ?

## 2022-01-26 NOTE — Telephone Encounter (Signed)
Patient appears to be commercially insured and therefore does not need to fill out any additional patient assistance paperwork assuming she still has access to a copay card (unless she's never needed patient assistance to begin with). We'll begin a benefits investigation and let her know if there is anything additionally that we need. ?

## 2022-01-26 NOTE — Telephone Encounter (Signed)
Patient would like our office to take over Nucala injections. PA is needed and she has been unable to contact prescribing provider for PA. ? ?Beth, please advise. Thanks ?

## 2022-01-29 ENCOUNTER — Other Ambulatory Visit: Payer: Self-pay | Admitting: Family

## 2022-01-29 DIAGNOSIS — E039 Hypothyroidism, unspecified: Secondary | ICD-10-CM

## 2022-02-01 NOTE — Telephone Encounter (Signed)
Pharmacy team, is there an update.  ?

## 2022-02-02 ENCOUNTER — Other Ambulatory Visit (HOSPITAL_COMMUNITY): Payer: Self-pay

## 2022-02-02 ENCOUNTER — Telehealth: Payer: Self-pay

## 2022-02-02 DIAGNOSIS — J4540 Moderate persistent asthma, uncomplicated: Secondary | ICD-10-CM

## 2022-02-02 MED ORDER — NUCALA 100 MG/ML ~~LOC~~ SOSY: 100.0000 mg | PREFILLED_SYRINGE | SUBCUTANEOUS | 5 refills | Status: DC

## 2022-02-02 NOTE — Telephone Encounter (Signed)
Received notification that PA is approved, however CMM does not offer any additional info other than the fact it was approved. ? ? ?Will await approval letter for approval dates. ?

## 2022-02-02 NOTE — Telephone Encounter (Signed)
Submitted a Prior Authorization request to [facility] for [place] via CoverMyMeds. Will update once we receive a response. ? ? ?Key: [id number] ?

## 2022-02-02 NOTE — Telephone Encounter (Signed)
Please see 02/02/2022 phone note.  ?Will close encounter.  ?

## 2022-02-02 NOTE — Telephone Encounter (Signed)
Noted. Thanks ? ?Lm for patient to provide update.  ?

## 2022-02-02 NOTE — Telephone Encounter (Signed)
Received notification from [facility]  regarding a prior authorization for [place]. Authorization has been APPROVED from 02/02/2022 to 02/01/2023. Approval letter sent to scan center. ? ?Patient must continue to fill through [facility]: [phone] ? ?Authorization # [id number] ? ? ?ATC and LVM for pt providing update, will send message through MyChart as well. Nothing further needed at this time. ?

## 2022-02-02 NOTE — Telephone Encounter (Signed)
ATC patient to determine if she is taking prefilled syringe. ? ?Sent rx to [facility] for Nucala PFS since medication dispense report shows PFS. ? ?Knox Saliva, PharmD, MPH, BCPS ?Clinical Pharmacist (Rheumatology and Pulmonology) ?

## 2022-02-20 ENCOUNTER — Telehealth: Payer: BC Managed Care – PPO | Admitting: Physician Assistant

## 2022-02-20 DIAGNOSIS — J4541 Moderate persistent asthma with (acute) exacerbation: Secondary | ICD-10-CM | POA: Diagnosis not present

## 2022-02-20 MED ORDER — BENZONATATE 100 MG PO CAPS
100.0000 mg | ORAL_CAPSULE | Freq: Three times a day (TID) | ORAL | 0 refills | Status: DC | PRN
Start: 2022-02-20 — End: 2022-03-22

## 2022-02-20 MED ORDER — DOXYCYCLINE HYCLATE 100 MG PO TABS: 100.0000 mg | ORAL_TABLET | Freq: Two times a day (BID) | ORAL | 0 refills | Status: DC

## 2022-02-20 MED ORDER — PREDNISONE 20 MG PO TABS: 40.0000 mg | ORAL_TABLET | Freq: Every day | ORAL | 0 refills | Status: AC

## 2022-02-20 NOTE — Progress Notes (Signed)

## 2022-02-20 NOTE — Progress Notes (Signed)
I have spent 5 minutes in review of e-visit questionnaire, review and updating patient chart, medical decision making and response to patient.   Valorie Mcgrory Cody Lincoln Kleiner, PA-C    

## 2022-02-23 ENCOUNTER — Other Ambulatory Visit: Payer: Self-pay | Admitting: Internal Medicine

## 2022-03-14 DIAGNOSIS — H1013 Acute atopic conjunctivitis, bilateral: Secondary | ICD-10-CM | POA: Diagnosis not present

## 2022-03-14 DIAGNOSIS — J4550 Severe persistent asthma, uncomplicated: Secondary | ICD-10-CM | POA: Diagnosis not present

## 2022-03-14 DIAGNOSIS — T50Z95A Adverse effect of other vaccines and biological substances, initial encounter: Secondary | ICD-10-CM | POA: Diagnosis not present

## 2022-03-14 DIAGNOSIS — J329 Chronic sinusitis, unspecified: Secondary | ICD-10-CM | POA: Diagnosis not present

## 2022-03-22 ENCOUNTER — Telehealth: Payer: BC Managed Care – PPO | Admitting: Physician Assistant

## 2022-03-22 DIAGNOSIS — J069 Acute upper respiratory infection, unspecified: Secondary | ICD-10-CM

## 2022-03-22 MED ORDER — BENZONATATE 100 MG PO CAPS: 100.0000 mg | ORAL_CAPSULE | Freq: Three times a day (TID) | ORAL | 0 refills | Status: DC | PRN

## 2022-03-22 MED ORDER — FLUTICASONE PROPIONATE 50 MCG/ACT NA SUSP: 2.0000 | Freq: Every day | NASAL | 0 refills | Status: DC

## 2022-03-22 MED ORDER — NAPROXEN 500 MG PO TABS: 500.0000 mg | ORAL_TABLET | Freq: Two times a day (BID) | ORAL | 0 refills | Status: DC

## 2022-03-22 MED ORDER — ALBUTEROL SULFATE HFA 108 (90 BASE) MCG/ACT IN AERS: 2.0000 | INHALATION_SPRAY | Freq: Four times a day (QID) | RESPIRATORY_TRACT | 0 refills | Status: DC | PRN

## 2022-03-22 NOTE — Progress Notes (Signed)
E-Visit for Corona Virus Screening  Your current symptoms could be consistent with the coronavirus.  Many health care providers can now test patients at their office but not all are.  [place] has multiple testing sites. For information on our [place] testing locations and hours go to [email or web address]  We are enrolling you in our [place] for [place] . Daily you will receive a questionnaire within the [place] website. Our COVID 19 response team will be monitoring your responses daily.  Testing Information: The COVID-19 Community Testing sites are testing BY APPOINTMENT ONLY.  You can schedule online at [email or web address]  If you do not have access to a smart phone or computer you may call [phone] for an appointment.   Additional testing sites in the Community:  For CVS Testing sites in [place]  FaceUpdate.uy  For Pop-up testing sites in [place]  BowlDirectory.co.[email or web address]  For [facility] [email or web address]  For [facility] testing in [place] and [employer] [email or web address]  For [facility] testing in [place]   [email or web address]  For  more information about community testing call [phone]   Please quarantine yourself while awaiting your test results. Please stay home for a minimum of 10 days from the first day of illness with improving symptoms and you have had 24 hours of no fever (without the use of Tylenol (Acetaminophen) Motrin (Ibuprofen) or any fever reducing medication).  Also - Do not get tested prior to returning to work because once you have had a positive test the test can stay positive for  more than a month in some cases.   You should wear a mask or cloth face covering over your nose and mouth if you must be around other people or animals, including pets (even at home). Try to stay at least 6 feet away from other people. This will protect the people around you.  Please continue good preventive care measures, including:  frequent hand-washing, avoid touching your face, cover coughs/sneezes, stay out of crowds and keep a 6 foot distance from others.  COVID-19 is a respiratory illness with symptoms that are similar to the flu. Symptoms are typically mild to moderate, but there have been cases of severe illness and death due to the virus.   The following symptoms may appear 2-14 days after exposure: Fever Cough Shortness of breath or difficulty breathing Chills Repeated shaking with chills Muscle pain Headache Sore throat New loss of taste or smell Fatigue Congestion or runny nose Nausea or vomiting Diarrhea  Go to the nearest hospital ED for assessment if fever/cough/breathlessness are severe or illness seems like a threat to life.  It is vitally important that if you feel that you have an infection such as this virus or any other virus that you stay home and away from places where you may spread it to others.  You should avoid contact with people age 75 and older.   You can use medication such as prescription cough medication called Tessalon Perles 100 mg. You may take 1-2 capsules every 8 hours as needed for cough,  prescription inhaler called Albuterol MDI 90 mcg /actuation 2 puffs every 4 hours as needed for shortness of breath, wheezing, cough, prescription anti-inflammatory called Naprosyn 500 mg. Take twice daily as needed for fever or body aches for 2 weeks, and prescription for Fluticasone nasal spray 2 sprays in each nostril one time per day  You may also take acetaminophen (Tylenol) as needed for fever.  Reduce your risk of any  infection by using the same precautions  used for avoiding the common cold or flu:  Wash your hands often with soap and warm water for at least 20 seconds.  If soap and water are not readily available, use an alcohol-based hand sanitizer with at least 60% alcohol.  If coughing or sneezing, cover your mouth and nose by coughing or sneezing into the elbow areas of your shirt or coat, into a tissue or into your sleeve (not your hands). Avoid shaking hands with others and consider head nods or verbal greetings only. Avoid touching your eyes, nose, or mouth with unwashed hands.  Avoid close contact with people who are sick. Avoid places or events with large numbers of people in one location, like concerts or sporting events. Carefully consider travel plans you have or are making. If you are planning any travel outside or inside the [place], visit the CDC's Travelers' Health webpage for the latest health notices. If you have some symptoms but not all symptoms, continue to monitor at home and seek medical attention if your symptoms worsen. If you are having a medical emergency, call 911.  HOME CARE Only take medications as instructed by your medical team. Drink plenty of fluids and get plenty of rest. A steam or ultrasonic humidifier can help if you have congestion.   GET HELP RIGHT AWAY IF YOU HAVE EMERGENCY WARNING SIGNS** FOR COVID-19. If you or someone is showing any of these signs seek emergency medical care immediately. Call 911 or proceed to your closest emergency facility if: You develop worsening high fever. Trouble breathing Bluish lips or face Persistent pain or pressure in the chest New confusion Inability to wake or stay awake You cough up blood. Your symptoms become more severe  **This list is not all possible symptoms. Contact your medical provider for any symptoms that are sever or concerning to you.  MAKE SURE YOU  Understand these instructions. Will watch your condition. Will get help right away if you are not doing  well or get worse.  Your e-visit answers were reviewed by a board certified advanced clinical practitioner to complete your personal care plan.  Depending on the condition, your plan could have included both over the counter or prescription medications.  If there is a problem please reply once you have received a response from your provider.  Your safety is important to [place].  If you have drug allergies check your prescription carefully.    You can use MyChart to ask questions about today's visit, request a non-urgent call back, or ask for a work or school excuse for 24 hours related to this e-Visit. If it has been greater than 24 hours you will need to follow up with your provider, or enter a new e-Visit to address those concerns. You will get an e-mail in the next two days asking about your experience.  I hope that your e-visit has been valuable and will speed your recovery. Thank you for using e-visits.   I provided 5 minutes of non face-to-face time during this encounter for chart review and documentation.

## 2022-03-24 ENCOUNTER — Telehealth: Payer: BC Managed Care – PPO | Admitting: Physician Assistant

## 2022-03-24 DIAGNOSIS — J069 Acute upper respiratory infection, unspecified: Secondary | ICD-10-CM

## 2022-03-24 DIAGNOSIS — R509 Fever, unspecified: Secondary | ICD-10-CM

## 2022-03-24 NOTE — Progress Notes (Signed)
Because of having a high fever with multiple symptoms, and a history of pneumonia, and being on Nucala which can suppress your immune system, I feel your condition warrants further evaluation and I recommend that you be seen in a face to face visit. It is warranted to have a thorough exam to rule out pneumonia and ear infection.    NOTE: There will be NO CHARGE for this eVisit   If you are having a true medical emergency please call 911.      For an urgent face to face visit, [place] has seven urgent care centers for your convenience:     [facility] [place] [facility] [place] Get Driving Directions [phone] [address] [place], [place] [address]    [facility]) Get Driving Directions [phone] [place], [place] [address]  [facility] [place] [facility]) Get Driving Directions [phone] [address] [place],  [place]  [address]  [facility] - at [place] Get Driving Directions  [phone] [place],  [place] [address]   [facility] Get Driving Directions [phone] [address] [place], [place], [place] [address]   [facility] Get Driving Directions  [phone] [address], [place] [address]   [facility] at [place] Get Driving Directions [phone] [address], [place],  [address]  Your MyChart E-visit questionnaire answers were reviewed by a board certified advanced clinical practitioner to complete your personal care plan based on your specific symptoms.  Thank you for using e-Visits.    I provided 5 minutes of non face-to-face time during this encounter for chart review and documentation.

## 2022-04-03 ENCOUNTER — Telehealth: Payer: BC Managed Care – PPO | Admitting: Emergency Medicine

## 2022-04-03 DIAGNOSIS — J019 Acute sinusitis, unspecified: Secondary | ICD-10-CM | POA: Diagnosis not present

## 2022-04-03 MED ORDER — AMOXICILLIN-POT CLAVULANATE 875-125 MG PO TABS: 1.0000 | ORAL_TABLET | Freq: Two times a day (BID) | ORAL | 0 refills | Status: DC

## 2022-04-12 ENCOUNTER — Encounter: Payer: Self-pay | Admitting: Family

## 2022-04-12 DIAGNOSIS — J4550 Severe persistent asthma, uncomplicated: Secondary | ICD-10-CM | POA: Diagnosis not present

## 2022-04-13 NOTE — Telephone Encounter (Signed)
LVM to call back to office to schedule appt

## 2022-04-19 ENCOUNTER — Ambulatory Visit: Payer: BC Managed Care – PPO | Admitting: Family

## 2022-04-19 ENCOUNTER — Encounter: Payer: Self-pay | Admitting: Family

## 2022-04-19 ENCOUNTER — Telehealth: Payer: Self-pay | Admitting: Pharmacist

## 2022-04-19 VITALS — BP 132/80 | HR 70 | Temp 97.6°F | Ht 62.0 in | Wt 152.0 lb

## 2022-04-19 DIAGNOSIS — I10 Essential (primary) hypertension: Secondary | ICD-10-CM | POA: Diagnosis not present

## 2022-04-19 DIAGNOSIS — H6691 Otitis media, unspecified, right ear: Secondary | ICD-10-CM | POA: Insufficient documentation

## 2022-04-19 DIAGNOSIS — H6501 Acute serous otitis media, right ear: Secondary | ICD-10-CM | POA: Diagnosis not present

## 2022-04-19 DIAGNOSIS — Z0000 Encounter for general adult medical examination without abnormal findings: Secondary | ICD-10-CM

## 2022-04-19 MED ORDER — AMOXICILLIN-POT CLAVULANATE 875-125 MG PO TABS: 1.0000 | ORAL_TABLET | Freq: Two times a day (BID) | ORAL | 0 refills | Status: AC

## 2022-04-19 MED ORDER — HYDROCOD POLI-CHLORPHE POLI ER 10-8 MG/5ML PO SUER: 5.0000 mL | Freq: Every evening | ORAL | 0 refills | Status: DC | PRN

## 2022-04-19 NOTE — Patient Instructions (Signed)
Start Augmentin. Ensure to take probiotics while on antibiotics and also for 2 weeks after completion. This can either be by eating yogurt daily or taking a probiotic supplement over the counter such as Culturelle.It is important to re-colonize the gut with good bacteria and also to prevent any diarrheal infections associated with antibiotic use.   Please take cough medication, Tussionex, at night only as needed. As we discussed, I do not recommend dosing throughout the day as coughing is a protective mechanism . It also helps to break up thick mucous.  Do not take cough suppressants with alcohol as can lead to trouble breathing. Advise caution if taking cough suppressant and operating machinery ( i.e driving a car) as you may feel very tired.   Let me know how you are doing

## 2022-04-19 NOTE — Progress Notes (Signed)
Subjective:    Patient ID: [name], female    DOB: 1971/09/10, 51 y.o.   MRN: [id number]  CC: Jazzlynn E Hillmann is a 51 y.o. female who presents today for an acute visit.    HPI: Complains of right ear pain x 6 weeks, waxed and waned.  Throbbing.  Improved after augmentin , completed 04/09/22.  Endorses cough, nasal congestion, sinus pressure No fever, sob, wheezing, ear drainage.    She is no longer on flonase as not effective. She has started antihistamine nasal spray ( she doesn't recall name) from allergist.   Compliant with allegra, singular, nucala.   E visit 02/20/22 started on doxycycline, prednisone, 03/22/22 given nebulizer , 04/02/22 given augmentin 7 day course.   HTN-Compliant with amlodipine 2.5 mg. At home 130/60-70 HISTORY:  Past Medical History:  Diagnosis Date   Allergy    Seasonal   Asthma    Complication of anesthesia    nausea and vomitting   Depression    Diabetes mellitus without complication ([place])    Family history of adverse reaction to anesthesia    nausea and vomitting   Hypertension    Hypothyroidism    Pneumonia    PONV (postoperative nausea and vomiting)    Shortness of breath    Sleep apnea    Thyroid disease    Past Surgical History:  Procedure Laterality Date   BREAST SURGERY  01/2013   Breast Reduction    COLONOSCOPY WITH PROPOFOL N/A 09/10/2020   Procedure: COLONOSCOPY WITH PROPOFOL;  Surgeon: Lucilla Lame, MD;  Location: [facility];  Service: Endoscopy;  Laterality: N/A;  priority 4   NASAL SINUS SURGERY     NASAL SINUS SURGERY  02/2014   REDUCTION MAMMAPLASTY Bilateral 2014   WISDOM TOOTH EXTRACTION     x4   Family History  Problem Relation Age of Onset   Hypertension Mother    Endometrial cancer Mother 72   Hypertension Father    Colon polyps Father    Cancer Maternal Aunt        Breast Cancer   Breast cancer Maternal Aunt    Breast cancer Paternal Aunt        Breast Cancer   Cancer Maternal  Grandmother 77       Bladder Cancer   Stroke Maternal Grandmother    Breast cancer Maternal Grandmother 54   Cancer Maternal Grandfather 68       Esophagus Cancer   Colon cancer Neg Hx    Ovarian cancer Neg Hx     Allergies: Shellfish allergy, Iodinated contrast media, Milk-related compounds, Tamiflu [oseltamivir phosphate], Wheat bran, Covid-19 (adenovirus) vaccine, Acetazolamide, and Xolair [omalizumab] Current Outpatient Medications on File Prior to Visit  Medication Sig Dispense Refill   albuterol (PROVENTIL) (2.5 MG/3ML) 0.083% nebulizer solution Take 3 mLs (2.5 mg total) by nebulization every 6 (six) hours as needed for wheezing or shortness of breath. 75 mL 12   albuterol (VENTOLIN HFA) 108 (90 Base) MCG/ACT inhaler Inhale 2 puffs into the lungs every 6 (six) hours as needed for wheezing or shortness of breath. 8 g 0   amLODipine (NORVASC) 2.5 MG tablet TAKE 1 TABLET BY MOUTH EVERY DAY 90 tablet 1   buPROPion (WELLBUTRIN XL) 150 MG 24 hr tablet Take 1 tablet (150 mg total) by mouth daily. 90 tablet 1   cetirizine (ZYRTEC ALLERGY) 10 MG tablet Take 1 tablet (10 mg total) by mouth daily. 30 tablet 11   EPIPEN 2-PAK 0.3 MG/0.3ML  SOAJ injection   11   fexofenadine (ALLEGRA) 180 MG tablet Take 1 tablet (180 mg total) by mouth daily. 30 tablet 11   Fluticasone-Umeclidin-Vilant 100-62.5-25 MCG/ACT AEPB Inhale 1 Inhalation into the lungs daily.     gabapentin (NEURONTIN) 100 MG capsule TAKE 1 CAPSULE BY MOUTH AT BEDTIME. 90 capsule 3   levothyroxine (SYNTHROID) 75 MCG tablet TAKE 1 TABLET BY MOUTH EVERY DAY 90 tablet 1   mepolizumab (NUCALA) 100 MG/ML SOSY Inject 100 mg into the skin every 28 (twenty-eight) days. 1 mL 5   montelukast (SINGULAIR) 10 MG tablet TAKE 1 TABLET BY MOUTH EVERY DAY 30 tablet 5   naproxen (NAPROSYN) 500 MG tablet Take 1 tablet (500 mg total) by mouth 2 (two) times daily with a meal. 30 tablet 0   topiramate (TOPAMAX) 100 MG tablet TAKE 1 TABLET BY MOUTH TWICE DAILY  180 tablet 3   Current Facility-Administered Medications on File Prior to Visit  Medication Dose Route Frequency Provider Last Rate Last Admin   Mepolizumab SOLR 100 mg  100 mg Subcutaneous Q28 days Flora Lipps, MD   100 mg at 08/03/17 0865    Social History   Tobacco Use   Smoking status: Never   Smokeless tobacco: Never  Vaping Use   Vaping Use: Never used  Substance Use Topics   Alcohol use: Yes    Alcohol/week: 0.0 standard drinks of alcohol    Comment: occasionally   Drug use: No    Review of Systems  Constitutional:  Negative for chills and fever.  HENT:  Positive for congestion and ear pain. Negative for ear discharge, sinus pressure and sinus pain.   Respiratory:  Positive for cough. Negative for shortness of breath and wheezing.   Cardiovascular:  Negative for chest pain and palpitations.  Gastrointestinal:  Negative for nausea and vomiting.      Objective:    BP 132/80   Pulse 70   Temp 97.6 F (36.4 C) (Temporal)   Ht '5\' 2"'$  (1.575 m)   Wt 152 lb (68.9 kg)   SpO2 97%   BMI 27.80 kg/m  Wt Readings from Last 3 Encounters:  04/19/22 152 lb (68.9 kg)  11/16/21 153 lb 6.4 oz (69.6 kg)  07/06/21 155 lb 6.4 oz (70.5 kg)     Physical Exam Vitals reviewed.  Constitutional:      Appearance: She is well-developed.  HENT:     Head: Normocephalic and atraumatic.     Right Ear: Hearing, ear canal and external ear normal. No decreased hearing noted. No drainage, swelling or tenderness. No middle ear effusion. No foreign body. Tympanic membrane is erythematous. Tympanic membrane is not bulging.     Left Ear: Hearing, tympanic membrane, ear canal and external ear normal. No decreased hearing noted. No drainage, swelling or tenderness.  No middle ear effusion. No foreign body. Tympanic membrane is not erythematous or bulging.     Nose: Nose normal. No rhinorrhea.     Right Sinus: No maxillary sinus tenderness or frontal sinus tenderness.     Left Sinus: No maxillary  sinus tenderness or frontal sinus tenderness.     Mouth/Throat:     Pharynx: Uvula midline. No oropharyngeal exudate or posterior oropharyngeal erythema.     Tonsils: No tonsillar abscesses.  Eyes:     Conjunctiva/sclera: Conjunctivae normal.  Cardiovascular:     Rate and Rhythm: Regular rhythm.     Pulses: Normal pulses.     Heart sounds: Normal heart sounds.  Pulmonary:  Effort: Pulmonary effort is normal.     Breath sounds: Normal breath sounds. No wheezing, rhonchi or rales.  Lymphadenopathy:     Head:     Right side of head: No submental, submandibular, tonsillar, preauricular, posterior auricular or occipital adenopathy.     Left side of head: No submental, submandibular, tonsillar, preauricular, posterior auricular or occipital adenopathy.     Cervical: No cervical adenopathy.  Skin:    General: Skin is warm and dry.  Neurological:     Mental Status: She is alert.  Psychiatric:        Speech: Speech normal.        Behavior: Behavior normal.        Thought Content: Thought content normal.        Assessment & Plan:   Problem List Items Addressed This Visit       Cardiovascular and Mediastinum   HTN (hypertension)    Improved as rested in room.  Advised to continue to monitor blood pressure at home and call me with any escalations.  Continue amlodipine 2.5 mg for now        Nervous and Auditory   Right otitis media - Primary    Slight erythematous tympanic membrane.  She is afebrile and overall well-appearing; she had significant prevent from previous course of Augmentin.  Agreed to extend course of Augmentin today.  She may require prednisone if symptoms do not completely resolve.  She will continue antihistamine nasal spray, Singulair, allegra as managed by allergy.       Relevant Medications   amoxicillin-clavulanate (AUGMENTIN) 875-125 MG tablet   chlorpheniramine-HYDROcodone (TUSSIONEX PENNKINETIC ER) 10-8 MG/5ML     Other   Routine physical examination        I have discontinued Sereen E. Eastwood's Spiriva Respimat, benzonatate, fluticasone, and amoxicillin-clavulanate. I am also having her start on amoxicillin-clavulanate and chlorpheniramine-HYDROcodone. Additionally, I am having her maintain her EpiPen 2-Pak, albuterol, topiramate, fexofenadine, cetirizine, buPROPion, gabapentin, levothyroxine, amLODipine, Nucala, montelukast, albuterol, naproxen, and Fluticasone-Umeclidin-Vilant. We will continue to administer mepolizumab.   Meds ordered this encounter  Medications   amoxicillin-clavulanate (AUGMENTIN) 875-125 MG tablet    Sig: Take 1 tablet by mouth 2 (two) times daily for 7 days.    Dispense:  14 tablet    Refill:  0    Order Specific Question:   Supervising Provider    Answer:   Crecencio Mc [[id number]]   chlorpheniramine-HYDROcodone (TUSSIONEX PENNKINETIC ER) 10-8 MG/5ML    Sig: Take 5 mLs by mouth at bedtime as needed for cough.    Dispense:  70 mL    Refill:  0    Order Specific Question:   Supervising Provider    Answer:   Crecencio Mc [[id number]]    Return precautions given.   Risks, benefits, and alternatives of the medications and treatment plan prescribed today were discussed, and patient expressed understanding.   Education regarding symptom management and diagnosis given to patient on AVS.  Continue to follow with Burnard Hawthorne, FNP for routine health maintenance.   Paolina E Gilberg and I agreed with plan.   Mable Paris, FNP

## 2022-04-19 NOTE — Assessment & Plan Note (Signed)
Improved as rested in room.  Advised to continue to monitor blood pressure at home and call me with any escalations.  Continue amlodipine 2.5 mg for now

## 2022-04-19 NOTE — Assessment & Plan Note (Addendum)
Slight erythematous tympanic membrane.  She is afebrile and overall well-appearing; she had significant prevent from previous course of Augmentin.  Agreed to extend course of Augmentin today.  She may require prednisone if symptoms do not completely resolve.  She will continue antihistamine nasal spray, Singulair, allegra as managed by allergy.

## 2022-04-19 NOTE — Telephone Encounter (Signed)
Received fax from [facility] for shipment authorization. Patient self-administers. Sent MyChart message to pt to advise her to reach out to pharmacy to set up refill if she is due  Knox Saliva, PharmD, MPH, BCPS, CPP Clinical Pharmacist (Rheumatology and Pulmonology)

## 2022-04-20 ENCOUNTER — Other Ambulatory Visit: Payer: Self-pay

## 2022-04-20 ENCOUNTER — Encounter: Payer: Self-pay | Admitting: Family

## 2022-05-15 ENCOUNTER — Other Ambulatory Visit: Payer: Self-pay | Admitting: Neurology

## 2022-05-15 IMAGING — RF DG SPINAL PUNCT LUMBAR DIAG WITH FL CT GUIDANCE
1 series · 1 of 1 positions shown · non-contrast
Comparison: none

CLINICAL DATA: About tissues.  Abnormal optic disc.

[Series 1: fluoro_iodine 2fps_bw · 0.17mm/px · 1 of 1 slices shown]
[im 1/1]
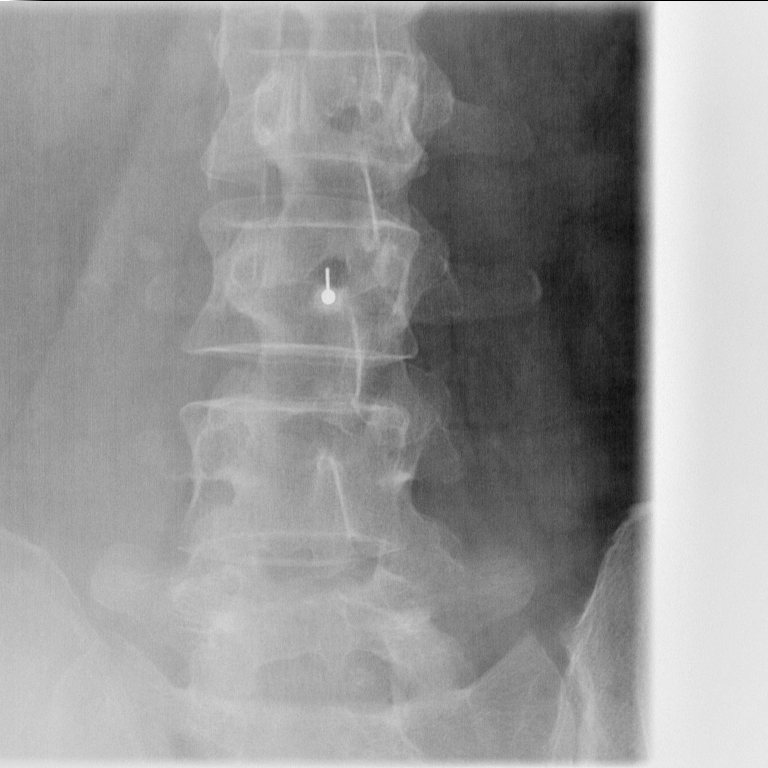

[1 of 1 positions shown; findings below may reference images not displayed]

EXAM:
DIAGNOSTIC LUMBAR PUNCTURE UNDER FLUOROSCOPIC GUIDANCE

FLUOROSCOPY TIME:  Fluoroscopy Time:  1 minutes 6 seconds

Number of Acquired Spot Images: 1

PROCEDURE:
Informed consent was obtained from the patient prior to the
procedure, including potential complications of headache, allergy,
pain, possible need for a spinal patch. With the patient prone, the
lower back was prepped with Betadine. 1% Lidocaine was used for
local anesthesia. Lumbar puncture was performed at the L3-L4 level
using a 22 gauge needle with return of clear CSF with an opening
pressure of 20 cm water. No CSF requested for labs. The patient
tolerated the procedure well and there were no apparent
complications.
IMPRESSION: Successful fluoroscopically directed lumbar puncture. Opening
pressure was 20 cm of water.

## 2022-05-17 ENCOUNTER — Other Ambulatory Visit: Payer: Self-pay | Admitting: Family

## 2022-05-17 DIAGNOSIS — F419 Anxiety disorder, unspecified: Secondary | ICD-10-CM

## 2022-05-17 DIAGNOSIS — H6501 Acute serous otitis media, right ear: Secondary | ICD-10-CM

## 2022-05-17 NOTE — Telephone Encounter (Signed)
Refill sent to pharmacy.   

## 2022-05-24 NOTE — Progress Notes (Signed)
Chief Complaint  Patient presents with   Follow-up    Rm 1, alone. Here fo yearly f/u. Pt reports increase in numbness in r hand last couple months.     HISTORY OF PRESENT ILLNESS:  05/29/22 ALL:  Bridget Peters is a 52 y.o. female here today for follow up for history of IIH and paresthesias. She was last seen 01/2020. LP was normal. She was advised to continue topiramate '100mg'$  BID but called 03/2020 reporting more brain fog. Dr Felecia Shelling suggested weaning dose. She has not been seen since.   She presents today reporting worsening numbness of right hand over the past 2-3 months. She describes a tingling feeling, like her hand is going to sleep. She notices this more when arm is in elevated position but can occur intermittently throughout the day. She has noticed some weakness of right grip. She is right handed. She first noticed right arm and right leg numbness after Covid vaccination in 2021. She works full time as Advertising copywriter.   She reports that headaches are stable. She has continued topiramate '100mg'$  daily (rx written for BID). She feels that headaches are well managed. Eye exam in 10/2021. She received new prescription glasses but states no optic nerve edema. She does report some blurred vision recently but no vision loss or TVO. She has lost 30-40lbs since diagnosis.   HISTORY (copied from Dr Garth Bigness previous note)  Bridget Peters is a 51 year old woman with numbness, visual changes, headaches and other symptoms   Update 01/27/2020: At the last visit, we started Topiramate 50 mg po bid.  She has tingling from the knees down and in the right hand/arm.  The left arm feels heavy but is not tingling.     She has noted issues with word finding over the past 2 weeks.     Headaches were doing better initially after starting topiramate (and also after the LP) but not more recently.      LP showed OP=250 11/10/2019 and she felt better after the LP.    She feels current symptoms are  similar to how she felt prior to the LP.   At that time, she also noted a mental fog with some word finding errors.   REVIEW OF SYSTEMS: Out of a complete 14 system review of symptoms, the patient complains only of the following symptoms, numbness, tingling and weakness of right hand and all other reviewed systems are negative.   ALLERGIES: Allergies  Allergen Reactions   Shellfish Allergy Hives and Shortness Of Breath   Iodinated Contrast Media Hives    Pt developed hives approximately 6 hours after receiving Isovue 300 for CT Scan. She was given '50mG'$  oral Benadryl and symptoms improved   Milk-Related Compounds Hives   Tamiflu [Oseltamivir Phosphate] Hives   Wheat Bran Hives   Covid-19 (Adenovirus) Vaccine     Pfizer   Acetazolamide Rash   Xolair [Omalizumab] Itching     HOME MEDICATIONS: Outpatient Medications Prior to Visit  Medication Sig Dispense Refill   albuterol (PROVENTIL) (2.5 MG/3ML) 0.083% nebulizer solution Take 3 mLs (2.5 mg total) by nebulization every 6 (six) hours as needed for wheezing or shortness of breath. 75 mL 12   amLODipine (NORVASC) 2.5 MG tablet TAKE 1 TABLET BY MOUTH EVERY DAY 90 tablet 1   buPROPion (WELLBUTRIN XL) 150 MG 24 hr tablet TAKE 1 TABLET BY MOUTH EVERY DAY 90 tablet 1   cetirizine (ZYRTEC ALLERGY) 10 MG tablet Take 1 tablet (10 mg total)  by mouth daily. 30 tablet 11   EPIPEN 2-PAK 0.3 MG/0.3ML SOAJ injection   11   fexofenadine (ALLEGRA) 180 MG tablet Take 1 tablet (180 mg total) by mouth daily. 30 tablet 11   Fluticasone-Umeclidin-Vilant 100-62.5-25 MCG/ACT AEPB Inhale 1 Inhalation into the lungs daily.     gabapentin (NEURONTIN) 100 MG capsule TAKE 1 CAPSULE BY MOUTH AT BEDTIME. 90 capsule 3   levothyroxine (SYNTHROID) 75 MCG tablet TAKE 1 TABLET BY MOUTH EVERY DAY 90 tablet 1   mepolizumab (NUCALA) 100 MG/ML SOSY Inject 100 mg into the skin every 28 (twenty-eight) days. 1 mL 5   montelukast (SINGULAIR) 10 MG tablet TAKE 1 TABLET BY MOUTH  EVERY DAY 30 tablet 5   Olopatadine-Mometasone 665-25 MCG/ACT SUSP Place 2 sprays into the nose daily.     topiramate (TOPAMAX) 100 MG tablet TAKE 1 TABLET BY MOUTH TWICE DAILY 180 tablet 3   albuterol (VENTOLIN HFA) 108 (90 Base) MCG/ACT inhaler Inhale 2 puffs into the lungs every 6 (six) hours as needed for wheezing or shortness of breath. 8 g 0   chlorpheniramine-HYDROcodone (TUSSIONEX PENNKINETIC ER) 10-8 MG/5ML Take 5 mLs by mouth at bedtime as needed for cough. 70 mL 0   naproxen (NAPROSYN) 500 MG tablet Take 1 tablet (500 mg total) by mouth 2 (two) times daily with a meal. 30 tablet 0   Facility-Administered Medications Prior to Visit  Medication Dose Route Frequency Provider Last Rate Last Admin   Mepolizumab SOLR 100 mg  100 mg Subcutaneous Q28 days Flora Lipps, MD   100 mg at 08/03/17 2035     PAST MEDICAL HISTORY: Past Medical History:  Diagnosis Date   Allergy    Seasonal   Asthma    Complication of anesthesia    nausea and vomitting   Depression    Diabetes mellitus without complication ([place])    Family history of adverse reaction to anesthesia    nausea and vomitting   Hypertension    Hypothyroidism    Pneumonia    PONV (postoperative nausea and vomiting)    Shortness of breath    Sleep apnea    Thyroid disease      PAST SURGICAL HISTORY: Past Surgical History:  Procedure Laterality Date   BREAST SURGERY  01/2013   Breast Reduction    COLONOSCOPY WITH PROPOFOL N/A 09/10/2020   Procedure: COLONOSCOPY WITH PROPOFOL;  Surgeon: Lucilla Lame, MD;  Location: [facility];  Service: Endoscopy;  Laterality: N/A;  priority 4   NASAL SINUS SURGERY     NASAL SINUS SURGERY  02/2014   REDUCTION MAMMAPLASTY Bilateral 2014   WISDOM TOOTH EXTRACTION     x4     FAMILY HISTORY: Family History  Problem Relation Age of Onset   Hypertension Mother    Endometrial cancer Mother 36   Hypertension Father    Colon polyps Father    Cancer Maternal Aunt        Breast  Cancer   Breast cancer Maternal Aunt    Breast cancer Paternal Aunt        Breast Cancer   Cancer Maternal Grandmother 37       Bladder Cancer   Stroke Maternal Grandmother    Breast cancer Maternal Grandmother 24   Cancer Maternal Grandfather 68       Esophagus Cancer   Colon cancer Neg Hx    Ovarian cancer Neg Hx      SOCIAL HISTORY: Social History   Socioeconomic History   Marital  status: Married    Spouse name: Clare Gandy   Number of children: Not on file   Years of education: 18   Highest education level: Not on file  Occupational History   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 [employer])   Right handed    Social Determinants of Health   Financial Resource Strain: Not on file  Food Insecurity: Not on file  Transportation Needs: Not on file  Physical Activity: Not on file  Stress: Not on file  Social Connections: Not on file  Intimate Partner Violence: Not on file     PHYSICAL EXAM  Vitals:   05/29/22 0800  BP: (!) 159/80  Pulse: 85  Weight: 155 lb (70.3 kg)  Height: '5\' 2"'$  (1.575 m)   Body mass index is 28.35 kg/m.  Generalized: Well developed, in no acute distress  Cardiology: normal rate and rhythm, no murmur auscultated  Respiratory: clear to auscultation bilaterally    Neurological examination  Mentation: Alert oriented to time, place, history taking. Follows all commands  speech and language fluent Cranial nerve II-XII: Pupils were equal round reactive to light. Extraocular movements were full, visual field were full on confrontational test. Facial sensation and strength were normal. Uvula tongue midline. Head turning and shoulder shrug  were normal and symmetric. Motor: The motor testing reveals 5 over 5 strength of all 4 extremities. Good symmetric motor tone is noted throughout.  Sensory: Sensory testing is intact to soft touch on all 4 extremities with exception of decreased sensation to soft touch and pinprick testing of right hand in median nerve distribution. No evidence of extinction is noted.  Coordination: Cerebellar testing reveals good finger-nose-finger  Gait and station: Gait is normal.    DIAGNOSTIC DATA (LABS, IMAGING, TESTING) - I reviewed patient records, labs, notes, testing and imaging myself where available.  Lab Results  Component Value Date   WBC 4.7 08/31/2021   HGB 13.2 08/31/2021   HCT 38.9 08/31/2021   MCV 86.7 08/31/2021   PLT 210.0 08/31/2021      Component Value Date/Time   NA 141 08/31/2021 0733   K 4.0 08/31/2021 0733   CL 110 08/31/2021 0733   CO2 25 08/31/2021 0733   GLUCOSE 129 (H) 08/31/2021 0733   BUN 20 08/31/2021 0733   CREATININE 0.75 08/31/2021 0733   CREATININE 0.90 05/14/2017 1220   CALCIUM 8.7 08/31/2021 0733   PROT 6.5 08/31/2021 0733   ALBUMIN 4.5 08/31/2021 0733   ALBUMIN 4.8 11/10/2019 1324   AST 21 08/31/2021 0733   ALT 25 08/31/2021 0733   ALKPHOS 79 08/31/2021 0733   BILITOT 0.4 08/31/2021 0733   GFRNONAA >60 06/06/2018 0721   GFRAA >60 06/06/2018 0721   Lab Results  Component Value Date   CHOL 233 (H) 08/31/2021   HDL 76.00  08/31/2021   LDLCALC 134 (H) 08/31/2021   LDLDIRECT 184.0 03/21/2019   TRIG 116.0 08/31/2021   CHOLHDL 3 08/31/2021   Lab Results  Component Value Date   HGBA1C 6.1 08/31/2021   Lab Results  Component Value Date   VITAMINB12 701 05/10/2020   Lab Results   Component Value Date   TSH 1.76 08/31/2021        No data to display               No data to display           ASSESSMENT AND PLAN  51 y.o. year old female  has a past medical history of Allergy, Asthma, Complication of anesthesia, Depression, Diabetes mellitus without complication ([place]), Family history of adverse reaction to anesthesia, Hypertension, Hypothyroidism, Pneumonia, PONV (postoperative nausea and vomiting), Shortness of breath, Sleep apnea, and Thyroid disease. here with    Optic disc edema  Numbness  Numbness and tingling of right hand - Plan: NCV with EMG(electromyography)  Shaelee E Gillham is doing well from a headache standpoint. No changes in vision and eye exam was reportedly unremarkable. She has lost 30-40lbs. She will continue topiramate '100mg'$  daily. May consider weaning in the future if headaches remain stable and no other cause of paresthesia of right hand noted. Neuro exam demonstrates numbness in the median nerve distribution. I have encouraged her to wear a wrist brace as often as possible. We will order NCS/EMG to assess. May consider referral to hand specialist as needed. We discussed increased dose of gabapentin, however, it makes her sleepy. Healthy lifestyle habits encouraged. She will follow up with PCP as directed. She will return to see me in 6 months, sooner if needed. She verbalizes understanding and agreement with this plan.   Orders Placed This Encounter  Procedures   NCV with EMG(electromyography)    Standing Status:   Future    Standing Expiration Date:   05/30/2023     Meds ordered this encounter  Medications   topiramate (TOPAMAX) 100 MG tablet    Sig: Take 1 tablet (100 mg total) by mouth daily.    Dispense:  90 tablet    Refill:  3    Order Specific Question:   Supervising Provider    Answer:   Melvenia Beam [[id number]]     Debbora Presto, MSN, FNP-C 05/29/2022, 8:32 AM  [facility] [address],  [place], [place] [address] [phone]

## 2022-05-24 NOTE — Patient Instructions (Signed)
Below is our plan:  We will set NCS/EMG. Try wearing wrist brace as often as possible. Consider referral to hand specialists pending nerve conduction results. Consider increasing gabapentin to '100mg'$  twice daily but could make you sleepy. Continue topiramate '100mg'$  daily for now Continue close follow up with your eye doctor.   Please make sure you are staying well hydrated. I recommend 50-60 ounces daily. Well balanced diet and regular exercise encouraged. Consistent sleep schedule with 6-8 hours recommended.   Please continue follow up with care team as directed.   Follow up with me in 6 months   You may receive a survey regarding today's visit. I encourage you to leave honest feed back as I do use this information to improve patient care. Thank you for seeing me today!

## 2022-05-29 ENCOUNTER — Ambulatory Visit (INDEPENDENT_AMBULATORY_CARE_PROVIDER_SITE_OTHER): Payer: BC Managed Care – PPO | Admitting: Family Medicine

## 2022-05-29 ENCOUNTER — Encounter: Payer: Self-pay | Admitting: Family Medicine

## 2022-05-29 VITALS — BP 159/80 | HR 85 | Ht 62.0 in | Wt 155.0 lb

## 2022-05-29 DIAGNOSIS — R200 Anesthesia of skin: Secondary | ICD-10-CM

## 2022-05-29 DIAGNOSIS — H4710 Unspecified papilledema: Secondary | ICD-10-CM

## 2022-05-29 DIAGNOSIS — R202 Paresthesia of skin: Secondary | ICD-10-CM

## 2022-05-29 MED ORDER — TOPIRAMATE 100 MG PO TABS: 100.0000 mg | ORAL_TABLET | Freq: Every day | ORAL | 3 refills | Status: DC

## 2022-05-31 ENCOUNTER — Telehealth: Payer: Self-pay | Admitting: Family Medicine

## 2022-05-31 NOTE — Telephone Encounter (Signed)
Order for NCV/EMG sent to [facility] for scheduling

## 2022-06-29 ENCOUNTER — Encounter: Payer: Self-pay | Admitting: Family Medicine

## 2022-07-13 DIAGNOSIS — G5601 Carpal tunnel syndrome, right upper limb: Secondary | ICD-10-CM | POA: Diagnosis not present

## 2022-07-15 ENCOUNTER — Other Ambulatory Visit: Payer: Self-pay | Admitting: Primary Care

## 2022-07-16 ENCOUNTER — Other Ambulatory Visit: Payer: Self-pay | Admitting: Primary Care

## 2022-07-17 ENCOUNTER — Encounter: Payer: Self-pay | Admitting: Family Medicine

## 2022-07-18 ENCOUNTER — Encounter: Payer: Self-pay | Admitting: Family

## 2022-07-28 ENCOUNTER — Telehealth: Payer: Self-pay | Admitting: Pharmacist

## 2022-07-28 DIAGNOSIS — J4540 Moderate persistent asthma, uncomplicated: Secondary | ICD-10-CM

## 2022-07-28 MED ORDER — NUCALA 100 MG/ML ~~LOC~~ SOSY: 100.0000 mg | PREFILLED_SYRINGE | SUBCUTANEOUS | 0 refills | Status: DC

## 2022-07-28 NOTE — Telephone Encounter (Signed)
Received refill request from [facility] for patient's Nucala. Patient has not been seen in >1 year. Nucala was last filled 07/13/22 per dispense report. Will send one additional refills but further refills will not be authorized until she is seen by provider  Knox Saliva, PharmD, MPH, BCPS, CPP Clinical Pharmacist (Rheumatology and Pulmonology)

## 2022-07-28 NOTE — Telephone Encounter (Signed)
Lm x1 for patient.  

## 2022-07-31 ENCOUNTER — Ambulatory Visit (INDEPENDENT_AMBULATORY_CARE_PROVIDER_SITE_OTHER): Payer: BC Managed Care – PPO

## 2022-07-31 DIAGNOSIS — Z23 Encounter for immunization: Secondary | ICD-10-CM

## 2022-07-31 MED ORDER — NUCALA 100 MG/ML ~~LOC~~ SOSY: 100.0000 mg | PREFILLED_SYRINGE | SUBCUTANEOUS | 2 refills | Status: DC

## 2022-07-31 NOTE — Telephone Encounter (Signed)
Lmx2 for patient.  Will close encounter per office protocol. Letter has been mailed to address on file.

## 2022-07-31 NOTE — Addendum Note (Signed)
Addended by: Cassandria Anger on: 07/31/2022 10:49 AM   Modules accepted: Orders

## 2022-07-31 NOTE — Telephone Encounter (Signed)
Nucala rx for 3 month supply sent to [facility].

## 2022-08-13 ENCOUNTER — Other Ambulatory Visit: Payer: Self-pay | Admitting: Internal Medicine

## 2022-08-13 ENCOUNTER — Other Ambulatory Visit: Payer: Self-pay | Admitting: Primary Care

## 2022-09-11 ENCOUNTER — Encounter: Payer: Self-pay | Admitting: Family

## 2022-09-15 ENCOUNTER — Encounter: Payer: Self-pay | Admitting: Family

## 2022-09-15 ENCOUNTER — Ambulatory Visit: Payer: BC Managed Care – PPO | Admitting: Family

## 2022-09-15 VITALS — BP 142/86 | HR 96 | Temp 97.7°F | Wt 154.4 lb

## 2022-09-15 DIAGNOSIS — R7309 Other abnormal glucose: Secondary | ICD-10-CM | POA: Diagnosis not present

## 2022-09-15 DIAGNOSIS — J40 Bronchitis, not specified as acute or chronic: Secondary | ICD-10-CM | POA: Insufficient documentation

## 2022-09-15 DIAGNOSIS — R0981 Nasal congestion: Secondary | ICD-10-CM | POA: Diagnosis not present

## 2022-09-15 DIAGNOSIS — J0110 Acute frontal sinusitis, unspecified: Secondary | ICD-10-CM | POA: Diagnosis not present

## 2022-09-15 DIAGNOSIS — I10 Essential (primary) hypertension: Secondary | ICD-10-CM

## 2022-09-15 DIAGNOSIS — J329 Chronic sinusitis, unspecified: Secondary | ICD-10-CM | POA: Insufficient documentation

## 2022-09-15 LAB — POCT GLYCOSYLATED HEMOGLOBIN (HGB A1C): Hemoglobin A1C: 6.2 % — AB (ref 4.0–5.6)

## 2022-09-15 LAB — POCT INFLUENZA A/B
Influenza A, POC: NEGATIVE
Influenza B, POC: NEGATIVE

## 2022-09-15 LAB — POC COVID19 BINAXNOW: SARS Coronavirus 2 Ag: NEGATIVE

## 2022-09-15 MED ORDER — AMOXICILLIN-POT CLAVULANATE 875-125 MG PO TABS: 1.0000 | ORAL_TABLET | Freq: Two times a day (BID) | ORAL | 0 refills | Status: AC

## 2022-09-15 MED ORDER — HYDROCOD POLI-CHLORPHE POLI ER 10-8 MG/5ML PO SUER: 5.0000 mL | Freq: Every evening | ORAL | 0 refills | Status: DC | PRN

## 2022-09-15 NOTE — Progress Notes (Signed)
Subjective:    Patient ID: Bridget Peters, female    DOB: [birth year], 51 y.o.   MRN: [id number]  CC: Bridget Peters is a 51 y.o. female who presents today for follow up.   HPI: Complains of sinus congestion 6 days, unchanged.  Endorses cough, chest tightness only when cough.Endorses fatigue Cough is worse in morning and at night.  Endorses chills, PND, right ear pain, teeth hurt , dull frontal ha, throat pain.   No sob, wheezing.   Taking aleve-D    No abx in 3 months.  Bridget Peters takes gabapentin '100mg'$  prn.  Hypertension-compliant with amlodipine 2.5 mg. No CP.    HISTORY:  Past Medical History:  Diagnosis Date   Allergy    Seasonal   Asthma    Complication of anesthesia    nausea and vomitting   Depression    Diabetes mellitus without complication ([place])    Family history of adverse reaction to anesthesia    nausea and vomitting   Hypertension    Hypothyroidism    Pneumonia    PONV (postoperative nausea and vomiting)    Shortness of breath    Sleep apnea    Thyroid disease    Past Surgical History:  Procedure Laterality Date   BREAST SURGERY  01/2013   Breast Reduction    COLONOSCOPY WITH PROPOFOL N/A 09/10/2020   Procedure: COLONOSCOPY WITH PROPOFOL;  Surgeon: Lucilla Lame, MD;  Location: [place];  Service: Endoscopy;  Laterality: N/A;  priority 4   NASAL SINUS SURGERY     NASAL SINUS SURGERY  02/2014   REDUCTION MAMMAPLASTY Bilateral 2014   WISDOM TOOTH EXTRACTION     x4   Family History  Problem Relation Age of Onset   Hypertension Mother    Endometrial cancer Mother 53   Hypertension Father    Colon polyps Father    Cancer Maternal Aunt        Breast Cancer   Breast cancer Maternal Aunt    Breast cancer Paternal Aunt        Breast Cancer   Cancer Maternal Grandmother 51       Bladder Cancer   Stroke Maternal Grandmother    Breast cancer Maternal Grandmother 34   Cancer Maternal Grandfather 68       Esophagus Cancer   Colon cancer  Neg Hx    Ovarian cancer Neg Hx     Allergies: Shellfish allergy, Iodinated contrast media, Milk-related compounds, Tamiflu [oseltamivir phosphate], Wheat bran, Covid-19 (adenovirus) vaccine, Acetazolamide, and Xolair [omalizumab] Current Outpatient Medications on File Prior to Visit  Medication Sig Dispense Refill   albuterol (PROVENTIL) (2.5 MG/3ML) 0.083% nebulizer solution Take 3 mLs (2.5 mg total) by nebulization every 6 (six) hours as needed for wheezing or shortness of breath. 75 mL 12   amLODipine (NORVASC) 2.5 MG tablet TAKE 1 TABLET BY MOUTH EVERY DAY 90 tablet 1   buPROPion (WELLBUTRIN XL) 150 MG 24 hr tablet TAKE 1 TABLET BY MOUTH EVERY DAY 90 tablet 1   cetirizine (ZYRTEC) 10 MG tablet TAKE 1 TABLET BY MOUTH EVERY DAY 30 tablet 0   EPIPEN 2-PAK 0.3 MG/0.3ML SOAJ injection   11   fexofenadine (ALLEGRA) 180 MG tablet TAKE 1 TABLET BY MOUTH EVERY DAY 30 tablet 0   Fluticasone-Umeclidin-Vilant 100-62.5-25 MCG/ACT AEPB Inhale 1 Inhalation into the lungs daily.     gabapentin (NEURONTIN) 100 MG capsule TAKE 1 CAPSULE BY MOUTH AT BEDTIME. 90 capsule 3   levothyroxine (SYNTHROID) 75  MCG tablet TAKE 1 TABLET BY MOUTH EVERY DAY 90 tablet 1   mepolizumab (NUCALA) 100 MG/ML SOSY Inject 100 mg into the skin every 28 (twenty-eight) days. 1 mL 2   montelukast (SINGULAIR) 10 MG tablet TAKE 1 TABLET BY MOUTH EVERY DAY 30 tablet 4   Olopatadine-Mometasone 665-25 MCG/ACT SUSP Place 2 sprays into the nose daily.     topiramate (TOPAMAX) 100 MG tablet Take 1 tablet (100 mg total) by mouth daily. 90 tablet 3   Current Facility-Administered Medications on File Prior to Visit  Medication Dose Route Frequency Provider Last Rate Last Admin   Mepolizumab SOLR 100 mg  100 mg Subcutaneous Q28 days Flora Lipps, MD   100 mg at 08/03/17 0102    Social History   Tobacco Use   Smoking status: Never   Smokeless tobacco: Never  Vaping Use   Vaping Use: Never used  Substance Use Topics   Alcohol use: Yes     Alcohol/week: 0.0 standard drinks of alcohol    Comment: occasionally   Drug use: No    Review of Systems  Constitutional:  Negative for chills and fever.  HENT:  Positive for congestion and ear pain.   Respiratory:  Positive for cough. Negative for shortness of breath.   Cardiovascular:  Negative for chest pain and palpitations.  Gastrointestinal:  Negative for nausea and vomiting.      Objective:    BP (!) 142/86   Pulse 96   Temp 97.7 F (36.5 C) (Oral)   Wt 154 lb 6.4 oz (70 kg)   SpO2 99%   BMI 28.24 kg/m  BP Readings from Last 3 Encounters:  09/15/22 (!) 142/86  05/29/22 (!) 159/80  04/19/22 132/80   Wt Readings from Last 3 Encounters:  09/15/22 154 lb 6.4 oz (70 kg)  05/29/22 155 lb (70.3 kg)  04/19/22 152 lb (68.9 kg)    Physical Exam Vitals reviewed.  Constitutional:      Appearance: Bridget Peters is well-developed.  HENT:     Head: Normocephalic and atraumatic.     Right Ear: Hearing, tympanic membrane, ear canal and external ear normal. No decreased hearing noted. No drainage, swelling or tenderness. No middle ear effusion. No foreign body. Tympanic membrane is not erythematous or bulging.     Left Ear: Hearing, tympanic membrane, ear canal and external ear normal. No decreased hearing noted. No drainage, swelling or tenderness.  No middle ear effusion. No foreign body. Tympanic membrane is not erythematous or bulging.     Nose: Nose normal. No rhinorrhea.     Right Sinus: No maxillary sinus tenderness or frontal sinus tenderness.     Left Sinus: No maxillary sinus tenderness or frontal sinus tenderness.     Mouth/Throat:     Pharynx: Uvula midline. No oropharyngeal exudate or posterior oropharyngeal erythema.     Tonsils: No tonsillar abscesses.  Eyes:     Conjunctiva/sclera: Conjunctivae normal.  Cardiovascular:     Rate and Rhythm: Regular rhythm.     Pulses: Normal pulses.     Heart sounds: Normal heart sounds.  Pulmonary:     Effort: Pulmonary effort  is normal.     Breath sounds: Normal breath sounds. No wheezing, rhonchi or rales.  Lymphadenopathy:     Head:     Right side of head: No submental, submandibular, tonsillar, preauricular, posterior auricular or occipital adenopathy.     Left side of head: No submental, submandibular, tonsillar, preauricular, posterior auricular or occipital adenopathy.  Cervical: No cervical adenopathy.  Skin:    General: Skin is warm and dry.  Neurological:     Mental Status: Bridget Peters is alert.  Psychiatric:        Speech: Speech normal.        Behavior: Behavior normal.        Thought Content: Thought content normal.        Assessment & Plan:   Problem List Items Addressed This Visit       Cardiovascular and Mediastinum   HTN (hypertension)    Previously well-controlled.  Suspect Aleve-D is contributing.  Advised Bridget Peters to stop Aleve-D.  Continue amlodipine 2.5 mg.  Bridget Peters will continue to monitor blood pressure.         Respiratory   Sinusitis - Primary    Afebrile. Nontoxic in appearance. Based on h/o allergies and symptoms today, concern for bacterial sinusitis. Start augmentin, probiotics. Provided Bridget Peters with tussionex.  I looked up patient on [place] Controlled Substances Reporting System PMP AWARE and saw no activity that raised concern of inappropriate use.   Bridget Peters will let me know if symptoms to not resolve.       Relevant Medications   chlorpheniramine-HYDROcodone (TUSSIONEX) 10-8 MG/5ML   amoxicillin-clavulanate (AUGMENTIN) 875-125 MG tablet   Other Visit Diagnoses     Elevated glucose       Relevant Orders   POCT HgB A1C (Completed)   Congestion of nasal sinus       Relevant Orders   POC COVID-19 (Completed)   POCT Influenza A/B (Completed)        I am having Bridget Peters start on chlorpheniramine-HYDROcodone and amoxicillin-clavulanate. I am also having Bridget Peters maintain Bridget Peters EpiPen 2-Pak, albuterol, gabapentin, levothyroxine, amLODipine, Fluticasone-Umeclidin-Vilant,  Olopatadine-Mometasone, buPROPion, topiramate, fexofenadine, cetirizine, Nucala, and montelukast. We will continue to administer mepolizumab.   Meds ordered this encounter  Medications   chlorpheniramine-HYDROcodone (TUSSIONEX) 10-8 MG/5ML    Sig: Take 5 mLs by mouth at bedtime as needed for cough.    Dispense:  70 mL    Refill:  0    Order Specific Question:   Supervising Provider    Answer:   Deborra Medina L [[id number]]   amoxicillin-clavulanate (AUGMENTIN) 875-125 MG tablet    Sig: Take 1 tablet by mouth 2 (two) times daily for 7 days.    Dispense:  14 tablet    Refill:  0    Order Specific Question:   Supervising Provider    Answer:   Crecencio Mc [[id number]]    Return precautions given.   Risks, benefits, and alternatives of the medications and treatment plan prescribed today were discussed, and patient expressed understanding.   Education regarding symptom management and diagnosis given to patient on AVS.  Continue to follow with Burnard Hawthorne, FNP for routine health maintenance.   Paolina E Huckeba and I agreed with plan.   Mable Paris, FNP

## 2022-09-15 NOTE — Assessment & Plan Note (Signed)
Previously well-controlled.  Suspect Aleve-D is contributing.  Advised her to stop Aleve-D.  Continue amlodipine 2.5 mg.  She will continue to monitor blood pressure.

## 2022-09-15 NOTE — Patient Instructions (Addendum)
Stop Aleve D  Start Augmentin. Ensure to take probiotics while on antibiotics and also for 2 weeks after completion. This can either be by eating yogurt daily or taking a probiotic supplement over the counter such as Culturelle.It is important to re-colonize the gut with good bacteria and also to prevent any diarrheal infections associated with antibiotic use.   I given you Tussionex to use at bedtime  Please take cough medication at night only as needed. As we discussed, I do not recommend dosing throughout the day as coughing is a protective mechanism . It also helps to break up thick mucous.  Do not take cough suppressants with alcohol as can lead to trouble breathing. Advise caution if taking cough suppressant and operating machinery ( i.e driving a car) as you may feel very tired.   Please let me know if you are not better before your trip!

## 2022-09-15 NOTE — Assessment & Plan Note (Signed)
Afebrile. Nontoxic in appearance. Based on h/o allergies and symptoms today, concern for bacterial sinusitis. Start augmentin, probiotics. Provided her with tussionex.  I looked up patient on [place] Controlled Substances Reporting System PMP AWARE and saw no activity that raised concern of inappropriate use.   She will let me know if symptoms to not resolve.

## 2022-09-19 ENCOUNTER — Ambulatory Visit (INDEPENDENT_AMBULATORY_CARE_PROVIDER_SITE_OTHER): Payer: BC Managed Care – PPO | Admitting: Internal Medicine

## 2022-09-19 ENCOUNTER — Encounter: Payer: Self-pay | Admitting: Internal Medicine

## 2022-09-19 VITALS — BP 122/80 | HR 78 | Temp 98.0°F | Ht 62.0 in | Wt 155.8 lb

## 2022-09-19 DIAGNOSIS — J4520 Mild intermittent asthma, uncomplicated: Secondary | ICD-10-CM

## 2022-09-19 DIAGNOSIS — E119 Type 2 diabetes mellitus without complications: Secondary | ICD-10-CM | POA: Diagnosis not present

## 2022-09-19 DIAGNOSIS — Z7984 Long term (current) use of oral hypoglycemic drugs: Secondary | ICD-10-CM | POA: Diagnosis not present

## 2022-09-19 DIAGNOSIS — J45901 Unspecified asthma with (acute) exacerbation: Secondary | ICD-10-CM | POA: Diagnosis not present

## 2022-09-19 DIAGNOSIS — G932 Benign intracranial hypertension: Secondary | ICD-10-CM | POA: Diagnosis not present

## 2022-09-19 DIAGNOSIS — D3132 Benign neoplasm of left choroid: Secondary | ICD-10-CM | POA: Diagnosis not present

## 2022-09-19 LAB — HM DIABETES EYE EXAM

## 2022-09-19 MED ORDER — ALBUTEROL SULFATE (2.5 MG/3ML) 0.083% IN NEBU: 2.5000 mg | INHALATION_SOLUTION | Freq: Four times a day (QID) | RESPIRATORY_TRACT | 12 refills | Status: DC | PRN

## 2022-09-19 NOTE — Progress Notes (Signed)
$'@Patient'f$  ID: Bridget Peters, female    DOB: Nov 05, 1970, 51 y.o.   MRN: [id number]   SYNOPSIS 51 year old female, never smoked. PMH significant for moderate persistent asthma, OSA, sinusitis, HTN, aortic atherosclerosis, type 2 diabetes, hypothyroidism.  ASTHMA Maintained on Spiriva respimat 1.58mg, Singualir, Allergra, Flonase and prn ventolin. [place]  CC Follow up ASTHMA  HPI She is doing well, needs refill of her medication.  She is receiving Nucala injections through allergy/immunology.  Started on Trelegy inhaler therapy and is working really well And failed Symbicort therapy   well controlled on present treatment.  She uses Ventolin as needed approx once a month Patient currently has sinus infection on antibiotics  No exacerbation at this time No evidence of heart failure at this time No evidence or signs of infection at this time No respiratory distress No fevers, chills, nausea, vomiting, diarrhea No evidence of lower extremity edema No evidence hemoptysis    Allergies  Allergen Reactions   Shellfish Allergy Hives and Shortness Of Breath   Iodinated Contrast Media Hives    Pt developed hives approximately 6 hours after receiving Isovue 300 for CT Scan. She was given '50mG'$  oral Benadryl and symptoms improved   Milk-Related Compounds Hives   Tamiflu [Oseltamivir Phosphate] Hives   Wheat Bran Hives   Covid-19 (Adenovirus) Vaccine     Pfizer   Acetazolamide Rash   Xolair [Omalizumab] Itching    Immunization History  Administered Date(s) Administered   Influenza Split 07/03/2016   Influenza, Seasonal, Injecte, Preservative Fre 08/05/2015   Influenza,inj,Quad PF,6+ Mos 07/26/2020, 07/06/2021, 07/31/2022   PFIZER(Purple Top)SARS-COV-2 Vaccination 10/13/2019   Pneumococcal Conjugate-13 04/24/2017   Pneumococcal Polysaccharide-23 02/13/2018   Pneumococcal-Unspecified 02/12/2012   Tdap 10/11/2009, 11/16/2021    Past Medical History:  Diagnosis  Date   Allergy    Seasonal   Asthma    Complication of anesthesia    nausea and vomitting   Depression    Diabetes mellitus without complication ([place]    Family history of adverse reaction to anesthesia    nausea and vomitting   Hypertension    Hypothyroidism    Pneumonia    PONV (postoperative nausea and vomiting)    Shortness of breath    Sleep apnea    Thyroid disease     Tobacco History: Social History   Tobacco Use  Smoking Status Never  Smokeless Tobacco Never   Counseling given: Not Answered   Outpatient Medications Prior to Visit  Medication Sig Dispense Refill   albuterol (PROVENTIL) (2.5 MG/3ML) 0.083% nebulizer solution Take 3 mLs (2.5 mg total) by nebulization every 6 (six) hours as needed for wheezing or shortness of breath. 75 mL 12   amLODipine (NORVASC) 2.5 MG tablet TAKE 1 TABLET BY MOUTH EVERY DAY 90 tablet 1   amoxicillin-clavulanate (AUGMENTIN) 875-125 MG tablet Take 1 tablet by mouth 2 (two) times daily for 7 days. 14 tablet 0   buPROPion (WELLBUTRIN XL) 150 MG 24 hr tablet TAKE 1 TABLET BY MOUTH EVERY DAY 90 tablet 1   cetirizine (ZYRTEC) 10 MG tablet TAKE 1 TABLET BY MOUTH EVERY DAY 30 tablet 0   chlorpheniramine-HYDROcodone (TUSSIONEX) 10-8 MG/5ML Take 5 mLs by mouth at bedtime as needed for cough. 70 mL 0   EPIPEN 2-PAK 0.3 MG/0.3ML SOAJ injection   11   fexofenadine (ALLEGRA) 180 MG tablet TAKE 1 TABLET BY MOUTH EVERY DAY 30 tablet 0   Fluticasone-Umeclidin-Vilant 100-62.5-25 MCG/ACT AEPB Inhale 1 Inhalation into the lungs daily.  gabapentin (NEURONTIN) 100 MG capsule TAKE 1 CAPSULE BY MOUTH AT BEDTIME. 90 capsule 3   levothyroxine (SYNTHROID) 75 MCG tablet TAKE 1 TABLET BY MOUTH EVERY DAY 90 tablet 1   mepolizumab (NUCALA) 100 MG/ML SOSY Inject 100 mg into the skin every 28 (twenty-eight) days. 1 mL 2   montelukast (SINGULAIR) 10 MG tablet TAKE 1 TABLET BY MOUTH EVERY DAY 30 tablet 4   Olopatadine-Mometasone 665-25 MCG/ACT SUSP Place 2 sprays  into the nose daily.     topiramate (TOPAMAX) 100 MG tablet Take 1 tablet (100 mg total) by mouth daily. 90 tablet 3   Facility-Administered Medications Prior to Visit  Medication Dose Route Frequency Provider Last Rate Last Admin   Mepolizumab SOLR 100 mg  100 mg Subcutaneous Q28 days Flora Lipps, MD   100 mg at 08/03/17 0923      CBC    Component Value Date/Time   WBC 4.7 08/31/2021 0733   RBC 4.49 08/31/2021 0733   HGB 13.2 08/31/2021 0733   HCT 38.9 08/31/2021 0733   PLT 210.0 08/31/2021 0733   MCV 86.7 08/31/2021 0733   MCH 29.4 03/25/2020 0837   MCHC 33.9 08/31/2021 0733   RDW 13.1 08/31/2021 0733   LYMPHSABS 1.7 08/31/2021 0733   MONOABS 0.4 08/31/2021 0733   EOSABS 0.0 08/31/2021 0733   BASOSABS 0.0 08/31/2021 0733    BMET    Component Value Date/Time   NA 141 08/31/2021 0733   K 4.0 08/31/2021 0733   CL 110 08/31/2021 0733   CO2 25 08/31/2021 0733   GLUCOSE 129 (H) 08/31/2021 0733   BUN 20 08/31/2021 0733   CREATININE 0.75 08/31/2021 0733   CREATININE 0.90 05/14/2017 1220   CALCIUM 8.7 08/31/2021 0733   GFRNONAA >60 06/06/2018 0721   GFRAA >60 06/06/2018 0721          BP 122/80 (BP Location: Left Arm, Cuff Size: Normal)   Pulse 78   Temp 98 F (36.7 C) (Temporal)   Ht '5\' 2"'$  (1.575 m)   Wt 155 lb 12.8 oz (70.7 kg)   SpO2 99%   BMI 28.50 kg/m      Review of Systems: Gen:  Denies  fever, sweats, chills weight loss  HEENT: Denies blurred vision, double vision, ear pain, eye pain, hearing loss, nose bleeds, sore throat Cardiac:  No dizziness, chest pain or heaviness, chest tightness,edema, No JVD Resp:   No cough, -sputum production, -shortness of breath,-wheezing, -hemoptysis,  Other:  All other systems negative    Physical Examination:   General Appearance: No distress  EYES PERRLA, EOM intact.   NECK Supple, No JVD Pulmonary: normal breath sounds, No wheezing.  CardiovascularNormal S1,S2.  No m/r/g.   Abdomen: Benign, Soft,  non-tender. ALL OTHER ROS ARE NEGATIVE         Assessment & Plan:     51 year old pleasant white female seen today for follow-up moderate persistent asthma with underlying sleep apnea currently on immunotherapy  Asthma, moderate persistent - Continue Nucala home injections q 4 weeks  (She needs this to avoid hospitalizations and asthma exacerbations) Continue Trelegy as prescribed - Use Ventolin rescue 2 puffs every 6 hours as needed for breakthrough shortness of breath/wheezing - Continue Singulair, Flonase, Allegra and Zyrtec as directed    OSA (obstructive sleep apnea) Moderate sleep apnea patient does not want to pursue CPAP at this time Oral appliance or Inspire device are options in the future if needed.   Acute sinus infection Augmentin 875 twice daily  for 14 days  GERD Protonix refilled   MEDICATION ADJUSTMENTS/LABS AND TESTS ORDERED: Continue Nucala as prescribed (Patient needs this to avoid hospitalizations and exacerbations)  Continue Trelegy as prescribed Avoid secondhand smoke Avoid SICK contacts Please Mask when appropriate    CURRENT MEDICATIONS REVIEWED AT [facility]   Patient  satisfied with Plan of action and management. All questions answered  Follow up 1 year  Total Time Spent  22 mins   Bridget Peters Patricia Pesa, M.D.  [facility]  Medical Director [place] Director [facility] Cardio-Pulmonary Department

## 2022-09-19 NOTE — Patient Instructions (Signed)
Continue Nucala as prescribed (Patient needs this to avoid hospitalizations and exacerbations)  Continue Trelegy as prescribed Avoid secondhand smoke Avoid SICK contacts Please Mask when appropriate

## 2022-09-20 ENCOUNTER — Other Ambulatory Visit: Payer: Self-pay | Admitting: Family

## 2022-09-20 DIAGNOSIS — E039 Hypothyroidism, unspecified: Secondary | ICD-10-CM

## 2022-10-09 ENCOUNTER — Telehealth: Payer: BC Managed Care – PPO | Admitting: Physician Assistant

## 2022-10-09 DIAGNOSIS — R6889 Other general symptoms and signs: Secondary | ICD-10-CM | POA: Diagnosis not present

## 2022-10-09 NOTE — Progress Notes (Signed)
E visit for Flu like symptoms   We are sorry that you are not feeling well.  Here is how we plan to help! Based on what you have shared with me it looks like you may have a respiratory virus that may be influenza.  Influenza or "the flu" is   an infection caused by a respiratory virus. The flu virus is highly contagious and persons who did not receive their yearly flu vaccination may "catch" the flu from close contact.  We have anti-viral medications to treat the viruses that cause this infection. They are not a "cure" and only shorten the course of the infection. These prescriptions are most effective when they are given within the first 2 days of "flu" symptoms. Antiviral medication are indicated if you have a high risk of complications from the flu. You should  also consider an antiviral medication if you are in close contact with someone who is at risk. These medications can help patients avoid complications from the flu  but have side effects that you should know. Possible side effects from Tamiflu or oseltamivir include nausea, vomiting, diarrhea, dizziness, headaches, eye redness, sleep problems or other respiratory symptoms. You should not take Tamiflu if you have an allergy to oseltamivir or any to the ingredients in Tamiflu.  Based upon your symptoms and potential risk factors I recommend that you follow the flu symptoms recommendation that I have listed below.  Please keep well-hydrated and try to get plenty of rest. If you have a humidifier, place it in the bedroom and run it at night. Start a saline nasal rinse for nasal congestion. You can consider use of a nasal steroid spray like Flonase or Nasacort OTC. You can alternate between Tylenol and Ibuprofen if needed for fever, body aches, headache and/or throat pain. Salt water-gargles and chloraseptic spray can be very beneficial for sore throat. Mucinex-DM for congestion or cough. Please take all prescribed medications as directed.   Remain out of work until [employer] for 24 hours without a fever-reducing medication, and you are feeling better.  You should mask until symptoms are resolved.  If anything worsens despite treatment, you need to be evaluated in-person. Please do not delay care.  I have sent a work note to Doctor, hospital. You can find by going to the Menu on your homepage, scrolling down to the Communications section, and selecting Letters. Let us know if you have any issue locating. Take care and feel better soon!     ANYONE WHO HAS FLU SYMPTOMS SHOULD: Stay home. The flu is highly contagious and going out or to work exposes others! Be sure to drink plenty of fluids. Water is fine as well as fruit juices, sodas and electrolyte beverages. You may want to stay away from caffeine or alcohol. If you are nauseated, try taking small sips of liquids. How do you know if you are getting enough fluid? Your urine should be a pale yellow or almost colorless. Get rest. Taking a steamy shower or using a humidifier may help nasal congestion and ease sore throat pain. Using a saline nasal spray works much the same way. Cough drops, hard candies and sore throat lozenges may ease your cough. Line up a caregiver. Have someone check on you regularly.   GET HELP RIGHT AWAY IF: You cannot keep down liquids or your medications. You become short of breath Your fell like you are going to pass out or loose consciousness. Your symptoms persist after you have completed your treatment plan  MAKE SURE YOU  Understand these instructions. Will watch your condition. Will get help right away if you are not doing well or get worse.  Your e-visit answers were reviewed by a board certified advanced clinical practitioner to complete your personal care plan.  Depending on the condition, your plan could have included both over the counter or prescription medications.  If there is a problem please reply  once you have received a response from  your provider.  Your safety is important to [place].  If you have drug allergies check your prescription carefully.    You can use MyChart to ask questions about today's visit, request a non-urgent call back, or ask for a work or school excuse for 24 hours related to this e-Visit. If it has been greater than 24 hours you will need to follow up with your provider, or enter a new e-Visit to address those concerns.  You will get an e-mail in the next two days asking about your experience.  I hope that your e-visit has been valuable and will speed your recovery. Thank you for using e-visits.

## 2022-10-09 NOTE — Progress Notes (Signed)
I have spent 5 minutes in review of e-visit questionnaire, review and updating patient chart, medical decision making and response to patient.   Jaziyah Gradel Cody Jernard Reiber, PA-C    

## 2022-10-11 ENCOUNTER — Encounter: Payer: Self-pay | Admitting: Family

## 2022-10-16 ENCOUNTER — Telehealth (INDEPENDENT_AMBULATORY_CARE_PROVIDER_SITE_OTHER): Payer: BC Managed Care – PPO | Admitting: Family

## 2022-10-16 ENCOUNTER — Encounter: Payer: Self-pay | Admitting: Family

## 2022-10-16 VITALS — BP 108/74 | Ht 62.0 in | Wt 150.0 lb

## 2022-10-16 DIAGNOSIS — N644 Mastodynia: Secondary | ICD-10-CM

## 2022-10-16 NOTE — Progress Notes (Unsigned)
Virtual Visit via Video Note  I connected with Bridget Peters on 10/17/22 at  3:30 PM EST by a video enabled telemedicine application and verified that I am speaking with the correct person using two identifiers. Location patient: home Location provider: work  Persons participating in the virtual visit: patient, provider  I discussed the limitations of evaluation and management by telemedicine and the availability of in person appointments. The patient expressed understanding and agreed to proceed.  HPI: Complains of left breast pain x 2 months. She reports itching was the first to start 3 months ago.  She feels a sharp pain 'through her nipple'. Pain is intermittent. No masses. She will feel pain if she squeezes nipple.  She had mammogram done at work and recommend diagnostic with [place] and radiology discussed scar tissue.   No skin changes, nipple inverted, nipple discharge.   H/o breast reduction.   Mammogram UTD 07/2022   ROS: See pertinent positives and negatives per HPI.  EXAM:  VITALS per patient if applicable: BP 277/82 Comment: pt reported  Ht '5\' 2"'$  (1.575 m)   Wt 150 lb (68 kg)   BMI 27.44 kg/m  BP Readings from Last 3 Encounters:  10/16/22 108/74  09/19/22 122/80  09/15/22 (!) 142/86   Wt Readings from Last 3 Encounters:  10/16/22 150 lb (68 kg)  09/19/22 155 lb 12.8 oz (70.7 kg)  09/15/22 154 lb 6.4 oz (70 kg)    GENERAL: alert, oriented, appears well and in no acute distress  HEENT: atraumatic, conjunttiva clear, no obvious abnormalities on inspection of external nose and ears  NECK: normal movements of the head and neck  LUNGS: on inspection no signs of respiratory distress, breathing rate appears normal, no obvious gross SOB, gasping or wheezing  CV: no obvious cyanosis  MS: moves all visible extremities without noticeable abnormality  PSYCH/NEURO: pleasant and cooperative, no obvious depression or anxiety, speech and thought processing grossly  intact  ASSESSMENT AND PLAN: Breast pain, left Assessment & Plan: We agreed to pursue diagnostic left breast ultrasound, left breast mammogram.  I have faxed these orders to [facility] for patient.  Patient will call to schedule and let me know if any issues in scheduling promptly.  She is also faxed mammogram report to office from [facility] from 10/ 2023.  Orders: -     MM DIAG BREAST TOMO UNI LEFT; Future -     US BREAST LTD UNI LEFT INC AXILLA; Future     -we discussed possible serious and likely etiologies, options for evaluation and workup, limitations of telemedicine visit vs in person visit, treatment, treatment risks and precautions. Pt prefers to treat via telemedicine empirically rather then risking or undertaking an in person visit at this moment.    I discussed the assessment and treatment plan with the patient. The patient was provided an opportunity to ask questions and all were answered. The patient agreed with the plan and demonstrated an understanding of the instructions.   The patient was advised to call back or seek an in-person evaluation if the symptoms worsen or if the condition fails to improve as anticipated.  Advised if desired AVS can be mailed or viewed via [place] if [place] user.   Mable Paris, FNP

## 2022-10-17 NOTE — Patient Instructions (Signed)
I have faxed diagnostic orders to [facility].  Please let me know if any issues in getting this scheduled promptly.

## 2022-10-17 NOTE — Assessment & Plan Note (Signed)
We agreed to pursue diagnostic left breast ultrasound, left breast mammogram.  I have faxed these orders to [facility] for patient.  Patient will call to schedule and let me know if any issues in scheduling promptly.  She is also faxed mammogram report to office from [facility] from 10/ 2023.

## 2022-10-24 ENCOUNTER — Telehealth: Payer: Self-pay | Admitting: Family

## 2022-10-24 DIAGNOSIS — N6342 Unspecified lump in left breast, subareolar: Secondary | ICD-10-CM | POA: Diagnosis not present

## 2022-10-24 DIAGNOSIS — R928 Other abnormal and inconclusive findings on diagnostic imaging of breast: Secondary | ICD-10-CM | POA: Diagnosis not present

## 2022-10-24 DIAGNOSIS — R92322 Mammographic fibroglandular density, left breast: Secondary | ICD-10-CM | POA: Diagnosis not present

## 2022-10-24 DIAGNOSIS — N6320 Unspecified lump in the left breast, unspecified quadrant: Secondary | ICD-10-CM | POA: Diagnosis not present

## 2022-10-24 NOTE — Telephone Encounter (Signed)
Spoke to Parker at [facility] and gave the verbal order otp for them to do biopsy on the pt.

## 2022-10-25 ENCOUNTER — Other Ambulatory Visit: Payer: Self-pay | Admitting: Primary Care

## 2022-10-25 DIAGNOSIS — J4540 Moderate persistent asthma, uncomplicated: Secondary | ICD-10-CM

## 2022-10-25 NOTE — Telephone Encounter (Signed)
Refill sent for [facility]A [facility]: [phone]  Dose: 100 mg SQ every 4 weeks  Last OV: 09/19/2022 Provider: Dr. Mortimer Fries  Next OV: not scheduled (1 year)  Knox Saliva, PharmD, MPH, BCPS Clinical Pharmacist (Rheumatology and Pulmonology)

## 2022-10-30 ENCOUNTER — Encounter: Payer: Self-pay | Admitting: Family

## 2022-12-04 NOTE — Progress Notes (Unsigned)
No chief complaint on file.   HISTORY OF PRESENT ILLNESS:  12/04/22 ALL:  Bridget Peters returns for follow up for history of IIH, headaches and paresthesias. She was last seen 05/2022. Headaches were well managed and we continued topiramate '100mg'$  QHS. She was having more numbness and tingling in right hand. Wrist brace recommended and NCS/EMG ordered for eval.   05/29/2022 ALL: Bridget Peters is a 52 y.o. female here today for follow up for history of IIH and paresthesias. She was last seen 01/2020. LP was normal. She was advised to continue topiramate '100mg'$  BID but called 03/2020 reporting more brain fog. Dr Bridget Peters suggested weaning dose. She has not been seen since.   She presents today reporting worsening numbness of right hand over the past 2-3 months. She describes a tingling feeling, like her hand is going to sleep. She notices this more when arm is in elevated position but can occur intermittently throughout the day. She has noticed some weakness of right grip. She is right handed. She first noticed right arm and right leg numbness after Covid vaccination in 2021. She works full time as Advertising copywriter.   She reports that headaches are stable. She has continued topiramate '100mg'$  daily (rx written for BID). She feels that headaches are well managed. Eye exam in 10/2021. She received new prescription glasses but states no optic nerve edema. She does report some blurred vision recently but no vision loss or TVO. She has lost 30-40lbs since diagnosis.   HISTORY (copied from Dr Bridget Peters previous note)  Bridget Peters is a 52 year old woman with numbness, visual changes, headaches and other symptoms   Update 01/27/2020: At the last visit, we started Topiramate 50 mg po bid.  She has tingling from the knees down and in the right hand/arm.  The left arm feels heavy but is not tingling.     She has noted issues with word finding over the past 2 weeks.     Headaches were doing better initially  after starting topiramate (and also after the LP) but not more recently.      LP showed OP=250 11/10/2019 and she felt better after the LP.    She feels current symptoms are similar to how she felt prior to the LP.   At that time, she also noted a mental fog with some word finding errors.   REVIEW OF SYSTEMS: Out of a complete 14 system review of symptoms, the patient complains only of the following symptoms, numbness, tingling and weakness of right hand and all other reviewed systems are negative.   ALLERGIES: Allergies  Allergen Reactions   Shellfish Allergy Hives and Shortness Of Breath   Iodinated Contrast Media Hives    Pt developed hives approximately 6 hours after receiving Isovue 300 for CT Scan. She was given '50mG'$  oral Benadryl and symptoms improved   Milk-Related Compounds Hives   Tamiflu [Oseltamivir Phosphate] Hives   Wheat Bran Hives   Covid-19 (Adenovirus) Vaccine     Pfizer   Acetazolamide Rash   Xolair [Omalizumab] Itching     HOME MEDICATIONS: Outpatient Medications Prior to Visit  Medication Sig Dispense Refill   albuterol (PROVENTIL) (2.5 MG/3ML) 0.083% nebulizer solution Take 3 mLs (2.5 mg total) by nebulization every 6 (six) hours as needed for wheezing or shortness of breath. 75 mL 12   amLODipine (NORVASC) 2.5 MG tablet TAKE 1 TABLET BY MOUTH EVERY DAY 90 tablet 1   buPROPion (WELLBUTRIN XL) 150 MG 24 hr tablet TAKE 1  TABLET BY MOUTH EVERY DAY 90 tablet 1   cetirizine (ZYRTEC) 10 MG tablet TAKE 1 TABLET BY MOUTH EVERY DAY 30 tablet 0   chlorpheniramine-HYDROcodone (TUSSIONEX) 10-8 MG/5ML Take 5 mLs by mouth at bedtime as needed for cough. (Patient not taking: Reported on 10/16/2022) 70 mL 0   EPIPEN 2-PAK 0.3 MG/0.3ML SOAJ injection   11   fexofenadine (ALLEGRA) 180 MG tablet TAKE 1 TABLET BY MOUTH EVERY DAY 30 tablet 0   Fluticasone-Umeclidin-Vilant 100-62.5-25 MCG/ACT AEPB Inhale 1 Inhalation into the lungs daily.     gabapentin (NEURONTIN) 100 MG capsule  TAKE 1 CAPSULE BY MOUTH AT BEDTIME. 90 capsule 3   levothyroxine (SYNTHROID) 75 MCG tablet TAKE 1 TABLET BY MOUTH EVERY DAY 90 tablet 1   mepolizumab (NUCALA) 100 MG/ML SOSY INJECT 1 SYRINGE UNDER THE SKIN EVERY 28 DAYS 1 mL 5   montelukast (SINGULAIR) 10 MG tablet TAKE 1 TABLET BY MOUTH EVERY DAY 30 tablet 4   Olopatadine-Mometasone 665-25 MCG/ACT SUSP Place 2 sprays into the nose daily.     topiramate (TOPAMAX) 100 MG tablet Take 1 tablet (100 mg total) by mouth daily. 90 tablet 3   No facility-administered medications prior to visit.     PAST MEDICAL HISTORY: Past Medical History:  Diagnosis Date   Allergy    Seasonal   Asthma    Complication of anesthesia    nausea and vomitting   Depression    Diabetes mellitus without complication ([place])    Family history of adverse reaction to anesthesia    nausea and vomitting   Hypertension    Hypothyroidism    Pneumonia    PONV (postoperative nausea and vomiting)    Shortness of breath    Sleep apnea    Thyroid disease      PAST SURGICAL HISTORY: Past Surgical History:  Procedure Laterality Date   BREAST SURGERY  01/2013   Breast Reduction    COLONOSCOPY WITH PROPOFOL N/A 09/10/2020   Procedure: COLONOSCOPY WITH PROPOFOL;  Surgeon: Lucilla Lame, MD;  Location: [facility];  Service: Endoscopy;  Laterality: N/A;  priority 4   NASAL SINUS SURGERY     NASAL SINUS SURGERY  02/2014   REDUCTION MAMMAPLASTY Bilateral 2014   WISDOM TOOTH EXTRACTION     x4     FAMILY HISTORY: Family History  Problem Relation Age of Onset   Hypertension Mother    Endometrial cancer Mother 62   Hypertension Father    Colon polyps Father    Cancer Maternal Aunt        Breast Cancer   Breast cancer Maternal Aunt    Breast cancer Paternal Aunt        Breast Cancer   Cancer Maternal Grandmother 52       Bladder Cancer   Stroke Maternal Grandmother    Breast cancer Maternal Grandmother 71   Cancer Maternal Grandfather 68       Esophagus  Cancer   Colon cancer Neg Hx    Ovarian cancer Neg Hx      SOCIAL HISTORY: Social History   Socioeconomic History   Marital status: Married    Spouse name: Bridget Peters   Number of children: Not on file   Years of education: 18   Highest education level: Not on file  Occupational History   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 [employer])   Right handed    Social Determinants of Health   Financial Resource Strain: Not on file  Food Insecurity: Not on file  Transportation Needs: Not on file  Physical Activity: Not on file  Stress: Not on file  Social Connections: Not on file  Intimate Partner Violence: Not on file     PHYSICAL EXAM  There were no vitals filed for this visit.  There is no height or weight on file to calculate BMI.  Generalized: Well developed, in no acute distress  Cardiology: normal rate and rhythm, no murmur auscultated  Respiratory: clear to auscultation bilaterally    Neurological examination  Mentation: Alert oriented to time, place, history taking. Follows all commands speech and language fluent Cranial nerve II-XII: Pupils were equal round reactive to light. Extraocular movements were full, visual field were full on confrontational test. Facial sensation and strength were normal. Uvula tongue midline. Head turning and shoulder shrug  were normal and symmetric. Motor: The  motor testing reveals 5 over 5 strength of all 4 extremities. Good symmetric motor tone is noted throughout.  Sensory: Sensory testing is intact to soft touch on all 4 extremities with exception of decreased sensation to soft touch and pinprick testing of right hand in median nerve distribution. No evidence of extinction is noted.  Coordination: Cerebellar testing reveals good finger-nose-finger  Gait and station: Gait is normal.    DIAGNOSTIC DATA (LABS, IMAGING, TESTING) - I reviewed patient records, labs, notes, testing and imaging myself where available.  Lab Results  Component Value Date   WBC 4.7 08/31/2021   HGB 13.2 08/31/2021   HCT 38.9 08/31/2021   MCV 86.7 08/31/2021   PLT 210.0 08/31/2021      Component Value Date/Time   NA 141 08/31/2021 0733   K 4.0 08/31/2021 0733   CL 110 08/31/2021 0733   CO2 25 08/31/2021 0733   GLUCOSE 129 (H) 08/31/2021 0733   BUN 20 08/31/2021 0733   CREATININE 0.75 08/31/2021 0733   CREATININE 0.90 05/14/2017 1220   CALCIUM 8.7 08/31/2021 0733   PROT 6.5 08/31/2021 0733   ALBUMIN 4.5 08/31/2021 0733   ALBUMIN 4.8 11/10/2019 1324   AST 21 08/31/2021 0733   ALT 25 08/31/2021 0733   ALKPHOS 79 08/31/2021 0733   BILITOT 0.4 08/31/2021 0733   GFRNONAA >60 06/06/2018 0721   GFRAA >60 06/06/2018 0721   Lab Results  Component Value Date   CHOL 233 (H) 08/31/2021   HDL 76.00 08/31/2021   LDLCALC 134 (H) 08/31/2021   LDLDIRECT 184.0 03/21/2019   TRIG 116.0 08/31/2021   CHOLHDL 3 08/31/2021   Lab Results  Component Value Date   HGBA1C 6.2 (A) 09/15/2022   Lab Results  Component Value Date   VITAMINB12 701 05/10/2020   Lab Results  Component Value Date   TSH 1.76 08/31/2021        No data to display  No data to display           ASSESSMENT AND PLAN  52 y.o. year old female  has a past medical history of Allergy, Asthma, Complication of anesthesia, Depression, Diabetes mellitus without complication  ([place]), Family history of adverse reaction to anesthesia, Hypertension, Hypothyroidism, Pneumonia, PONV (postoperative nausea and vomiting), Shortness of breath, Sleep apnea, and Thyroid disease. here with    No diagnosis found.  Bridget Peters is doing well from a headache standpoint. No changes in vision and eye exam was reportedly unremarkable. She has lost 30-40lbs. She will continue topiramate '100mg'$  daily. May consider weaning in the future if headaches remain stable and no other cause of paresthesia of right hand noted. Neuro exam demonstrates numbness in the median nerve distribution. I have encouraged her to wear a wrist brace as often as possible. We will order NCS/EMG to assess. May consider referral to hand specialist as needed. We discussed increased dose of gabapentin, however, it makes her sleepy. Healthy lifestyle habits encouraged. She will follow up with PCP as directed. She will return to see me in 6 months, sooner if needed. She verbalizes understanding and agreement with this plan.   No orders of the defined types were placed in this encounter.    No orders of the defined types were placed in this encounter.    Debbora Presto, MSN, FNP-C 12/04/2022, 7:50 AM  [facility] [address] [place], [place] [address] [phone]

## 2022-12-04 NOTE — Patient Instructions (Signed)
Below is our plan:  We will continue topiramate for now. Consider weaning dose to 50mg  daily to see if this helps with brain fog and manages headaches. We will send you to ortho to discuss treatment options for carpal tunnel. Continue wearing brace.   Please make sure you are staying well hydrated. I recommend 50-60 ounces daily. Well balanced diet and regular exercise encouraged. Consistent sleep schedule with 6-8 hours recommended.   Please continue follow up with care team as directed.   Follow up with me in 1 year   You may receive a survey regarding today's visit. I encourage you to leave honest feed back as I do use this information to improve patient care. Thank you for seeing me today!

## 2022-12-05 ENCOUNTER — Ambulatory Visit (INDEPENDENT_AMBULATORY_CARE_PROVIDER_SITE_OTHER): Payer: BC Managed Care – PPO | Admitting: Family Medicine

## 2022-12-05 ENCOUNTER — Encounter: Payer: Self-pay | Admitting: Family Medicine

## 2022-12-05 VITALS — BP 147/86 | HR 67 | Ht 62.0 in | Wt 158.0 lb

## 2022-12-05 DIAGNOSIS — G5601 Carpal tunnel syndrome, right upper limb: Secondary | ICD-10-CM

## 2022-12-05 DIAGNOSIS — R519 Headache, unspecified: Secondary | ICD-10-CM | POA: Diagnosis not present

## 2022-12-05 DIAGNOSIS — R200 Anesthesia of skin: Secondary | ICD-10-CM

## 2022-12-05 DIAGNOSIS — M542 Cervicalgia: Secondary | ICD-10-CM

## 2022-12-05 DIAGNOSIS — H4710 Unspecified papilledema: Secondary | ICD-10-CM

## 2022-12-05 DIAGNOSIS — R202 Paresthesia of skin: Secondary | ICD-10-CM | POA: Diagnosis not present

## 2022-12-06 ENCOUNTER — Telehealth: Payer: Self-pay | Admitting: Family Medicine

## 2022-12-06 NOTE — Telephone Encounter (Signed)
Referral for orthopedic surgery fax to [facility]. Phone: [phone], Fax: [phone].

## 2022-12-21 ENCOUNTER — Other Ambulatory Visit: Payer: Self-pay | Admitting: Primary Care

## 2022-12-21 ENCOUNTER — Other Ambulatory Visit: Payer: Self-pay | Admitting: Family

## 2022-12-21 ENCOUNTER — Other Ambulatory Visit: Payer: Self-pay

## 2022-12-21 DIAGNOSIS — M542 Cervicalgia: Secondary | ICD-10-CM

## 2023-01-04 ENCOUNTER — Telehealth: Payer: Self-pay | Admitting: Pharmacist

## 2023-01-04 NOTE — Telephone Encounter (Signed)
Received fax that patient's PA for Nucala sent to expire soon (02/01/23). Submitted a Prior Authorization renewal request to [facility] for NUCALA PFS via CoverMyMeds. Will update once we receive a response.  Key: [id number]  Knox Saliva, PharmD, MPH, BCPS, CPP Clinical Pharmacist (Rheumatology and Pulmonology)

## 2023-01-17 NOTE — Telephone Encounter (Signed)
Submitted a second Prior Authorization request to [facility] for NUCALA PFS via CoverMyMeds as the initial request has been marked N/A. Will update once we receive a response.  Key: [id number]  If this second request cancels out then we will try submitting for the AJ formulation as this has historically covered the PFS and is able to be completed via CMM.

## 2023-01-22 NOTE — Telephone Encounter (Signed)
[place]  reg[place] for NUCALA. Authorization has been APPROVED from 01/22/23 to 01/21/24. Approval letter sent to scan center.  Patient can continue to fill through [facility]: [phone]  Authorization # [id number]  Chesley Mires, PharmD, MPH, BCPS, CPP Clinical Pharmacist (Rheumatology and Pulmonology)

## 2023-01-22 NOTE — Telephone Encounter (Signed)
The pending PA resulted in the same response.  Submitted an URGENT Prior Authorization request to [facility] for NUCALA AJ via CoverMyMeds. Will update once we receive a response.  Key: [id number]

## 2023-02-15 ENCOUNTER — Other Ambulatory Visit: Payer: Self-pay | Admitting: Family

## 2023-02-15 DIAGNOSIS — E039 Hypothyroidism, unspecified: Secondary | ICD-10-CM

## 2023-02-20 ENCOUNTER — Other Ambulatory Visit: Payer: Self-pay | Admitting: Internal Medicine

## 2023-03-02 ENCOUNTER — Other Ambulatory Visit: Payer: Self-pay | Admitting: Family

## 2023-05-31 ENCOUNTER — Other Ambulatory Visit: Payer: Self-pay | Admitting: Family

## 2023-05-31 DIAGNOSIS — E039 Hypothyroidism, unspecified: Secondary | ICD-10-CM

## 2023-06-06 ENCOUNTER — Other Ambulatory Visit: Payer: Self-pay

## 2023-06-06 MED ORDER — TOPIRAMATE 100 MG PO TABS: 100.0000 mg | ORAL_TABLET | Freq: Every day | ORAL | 3 refills | Status: DC

## 2023-07-04 ENCOUNTER — Telehealth: Payer: BC Managed Care – PPO | Admitting: Physician Assistant

## 2023-07-04 ENCOUNTER — Encounter: Payer: Self-pay | Admitting: Family

## 2023-07-04 DIAGNOSIS — J019 Acute sinusitis, unspecified: Secondary | ICD-10-CM | POA: Diagnosis not present

## 2023-07-04 DIAGNOSIS — B9789 Other viral agents as the cause of diseases classified elsewhere: Secondary | ICD-10-CM

## 2023-07-04 MED ORDER — PREDNISONE 10 MG (21) PO TBPK
ORAL_TABLET | ORAL | 0 refills | Status: DC
Start: 2023-07-04 — End: 2023-07-19

## 2023-07-04 NOTE — Progress Notes (Signed)
I have spent 5 minutes in review of e-visit questionnaire, review and updating patient chart, medical decision making and response to patient.   Mia Milan Cody Jacklynn Dehaas, PA-C    

## 2023-07-04 NOTE — Progress Notes (Signed)
E-Visit for Sinus Problems  We are sorry that you are not feeling well.  Here is how we plan to help!  Based on what you have shared with me it looks like you have sinusitis.  Sinusitis is inflammation and infection in the sinus cavities of the head.  Based on your presentation I believe you most likely have Acute Viral Sinusitis.This is an infection most likely caused by a virus. There is not specific treatment for viral sinusitis other than to help you with the symptoms until the infection runs its course.  You may use an oral decongestant such as Mucinex D or if you have glaucoma or high blood pressure use plain Mucinex. Saline nasal spray help and can safely be used as often as needed for congestion, I have prescribed a prednisone pack to take as directed to help relax airways and reduce sinus inflammation.  We do recommend testing for COVID to be cautious giving high rates in our area. Please let us know if this is positive.  Some authorities believe that zinc sprays or the use of Echinacea may shorten the course of your symptoms.  Sinus infections are not as easily transmitted as other respiratory infection, however we still recommend that you avoid close contact with loved ones, especially the very young and elderly.  Remember to wash your hands thoroughly throughout the day as this is the number one way to prevent the spread of infection!  Providers prescribe antibiotics to treat infections caused by bacteria. Antibiotics are very powerful in treating bacterial infections when they are used properly. To maintain their effectiveness, they should be used only when necessary. Overuse of antibiotics has resulted in the development of superbugs that are resistant to treatment!    After careful review of your answers, I would not recommend an antibiotic for your condition.  Antibiotics are not effective against viruses and therefore should not be used to treat them. Common examples of infections  caused by viruses include colds and flu   Home Care: Only take medications as instructed by your medical team. Do not take these medications with alcohol. A steam or ultrasonic humidifier can help congestion.  You can place a towel over your head and breathe in the steam from hot water coming from a faucet. Avoid close contacts especially the very young and the elderly. Cover your mouth when you cough or sneeze. Always remember to wash your hands.  Get Help Right Away If: You develop worsening fever or sinus pain. You develop a severe head ache or visual changes. Your symptoms persist after you have completed your treatment plan.  Make sure you Understand these instructions. Will watch your condition. Will get help right away if you are not doing well or get worse.   Thank you for choosing an e-visit.  Your e-visit answers were reviewed by a board certified advanced clinical practitioner to complete your personal care plan. Depending upon the condition, your plan could have included both over the counter or prescription medications.  Please review your pharmacy choice. Make sure the pharmacy is open so you can pick up prescription now. If there is a problem, you may contact your provider through [employer] and have the prescription routed to another pharmacy.  Your safety is important to [place]. If you have drug allergies check your prescription carefully.   For the next 24 hours you can use MyChart to ask questions about today's visit, request a non-urgent call back, or ask for a work or school  excuse. You will get an email in the next two days asking about your experience. I hope that your e-visit has been valuable and will speed your recovery.

## 2023-07-07 ENCOUNTER — Telehealth: Payer: Self-pay | Admitting: Family

## 2023-07-09 NOTE — Telephone Encounter (Signed)
Patient just called back. I told her she has to schedule an appointment for her refills. She said she will call back tomorrow to schedule an appointment.

## 2023-07-16 ENCOUNTER — Ambulatory Visit: Payer: BC Managed Care – PPO

## 2023-07-19 ENCOUNTER — Encounter: Payer: Self-pay | Admitting: Family

## 2023-07-19 ENCOUNTER — Ambulatory Visit: Payer: BC Managed Care – PPO | Admitting: Family

## 2023-07-19 VITALS — BP 136/76 | HR 76 | Temp 97.8°F | Ht 62.0 in | Wt 159.6 lb

## 2023-07-19 DIAGNOSIS — J40 Bronchitis, not specified as acute or chronic: Secondary | ICD-10-CM

## 2023-07-19 DIAGNOSIS — Z13220 Encounter for screening for lipoid disorders: Secondary | ICD-10-CM

## 2023-07-19 DIAGNOSIS — Z8639 Personal history of other endocrine, nutritional and metabolic disease: Secondary | ICD-10-CM | POA: Diagnosis not present

## 2023-07-19 DIAGNOSIS — Z136 Encounter for screening for cardiovascular disorders: Secondary | ICD-10-CM

## 2023-07-19 DIAGNOSIS — R7303 Prediabetes: Secondary | ICD-10-CM | POA: Diagnosis not present

## 2023-07-19 DIAGNOSIS — Z0000 Encounter for general adult medical examination without abnormal findings: Secondary | ICD-10-CM | POA: Diagnosis not present

## 2023-07-19 LAB — TSH: TSH: 0.75 u[IU]/mL (ref 0.35–5.50)

## 2023-07-19 LAB — CBC WITH DIFFERENTIAL/PLATELET
Basophils Absolute: 0 10*3/uL (ref 0.0–0.1)
Basophils Relative: 0.4 % (ref 0.0–3.0)
Eosinophils Absolute: 0 10*3/uL (ref 0.0–0.7)
Eosinophils Relative: 0.4 % (ref 0.0–5.0)
HCT: 41.9 % (ref 36.0–46.0)
Hemoglobin: 13.9 g/dL (ref 12.0–15.0)
Lymphocytes Relative: 34.2 % (ref 12.0–46.0)
Lymphs Abs: 1.9 10*3/uL (ref 0.7–4.0)
MCHC: 33.2 g/dL (ref 30.0–36.0)
MCV: 87.8 fL (ref 78.0–100.0)
Monocytes Absolute: 0.3 10*3/uL (ref 0.1–1.0)
Monocytes Relative: 5.9 % (ref 3.0–12.0)
Neutro Abs: 3.2 10*3/uL (ref 1.4–7.7)
Neutrophils Relative %: 59.1 % (ref 43.0–77.0)
Platelets: 256 10*3/uL (ref 150.0–400.0)
RBC: 4.78 Mil/uL (ref 3.87–5.11)
RDW: 12.9 % (ref 11.5–15.5)
WBC: 5.5 10*3/uL (ref 4.0–10.5)

## 2023-07-19 LAB — COMPREHENSIVE METABOLIC PANEL
ALT: 37 U/L — ABNORMAL HIGH (ref 0–35)
AST: 22 U/L (ref 0–37)
Albumin: 4.6 g/dL (ref 3.5–5.2)
Alkaline Phosphatase: 63 U/L (ref 39–117)
BUN: 18 mg/dL (ref 6–23)
CO2: 23 meq/L (ref 19–32)
Calcium: 9.4 mg/dL (ref 8.4–10.5)
Chloride: 108 meq/L (ref 96–112)
Creatinine, Ser: 0.68 mg/dL (ref 0.40–1.20)
GFR: 100.07 mL/min (ref >60.00)
Glucose, Bld: 129 mg/dL — ABNORMAL HIGH (ref 70–99)
Potassium: 4 meq/L (ref 3.5–5.1)
Sodium: 140 meq/L (ref 135–145)
Total Bilirubin: 0.4 mg/dL (ref 0.2–1.2)
Total Protein: 6.8 g/dL (ref 6.0–8.3)

## 2023-07-19 LAB — MICROALBUMIN / CREATININE URINE RATIO
Creatinine,U: 17.4 mg/dL
Microalb Creat Ratio: 4 mg/g (ref 0.0–30.0)
Microalb, Ur: <0.7 mg/dL (ref 0.0–1.9)

## 2023-07-19 LAB — LIPID PANEL
Cholesterol: 287 mg/dL — ABNORMAL HIGH (ref 0–200)
HDL: 67.6 mg/dL (ref >39.00)
LDL Cholesterol: 165 mg/dL — ABNORMAL HIGH (ref 0–99)
NonHDL: 218.98
Total CHOL/HDL Ratio: 4
Triglycerides: 271 mg/dL — ABNORMAL HIGH (ref 0.0–149.0)
VLDL: 54.2 mg/dL — ABNORMAL HIGH (ref 0.0–40.0)

## 2023-07-19 LAB — HEMOGLOBIN A1C: Hgb A1c MFr Bld: 6.7 % — ABNORMAL HIGH (ref 4.6–6.5)

## 2023-07-19 LAB — VITAMIN D 25 HYDROXY (VIT D DEFICIENCY, FRACTURES): VITD: 36.87 ng/mL (ref 30.00–100.00)

## 2023-07-19 MED ORDER — DOXYCYCLINE HYCLATE 100 MG PO TABS
100.0000 mg | ORAL_TABLET | Freq: Two times a day (BID) | ORAL | 0 refills | Status: AC
Start: 2023-07-19 — End: 2023-07-26

## 2023-07-19 MED ORDER — GUAIFENESIN-CODEINE 100-10 MG/5ML PO SYRP
5.0000 mL | ORAL_SOLUTION | Freq: Every evening | ORAL | 0 refills | Status: DC | PRN
Start: 2023-07-19 — End: 2023-08-22

## 2023-07-19 NOTE — Assessment & Plan Note (Signed)
Afebrile.  No acute respiratory distress.  Based on duration of symptoms, concern for bacterial URI.  Start doxycycline.  Provided Cheratussin to use at bedtime.  She will let me know how she is doing

## 2023-07-19 NOTE — Patient Instructions (Signed)
Continue albuterol nebulizer, recommend morning and evening scheduled until symptoms start to improve Start doxycycline.  Please ensure you are on probiotics Please send the mammogram report next week.    Always so nice to see you!  Health Maintenance for Postmenopausal Women Menopause is a normal process in which your ability to get pregnant comes to an end. This process happens slowly over many months or years, usually between the ages of 32 and 48. Menopause is complete when you have missed your menstrual period for 12 months. It is important to talk with your health care provider about some of the most common conditions that affect women after menopause (postmenopausal women). These include heart disease, cancer, and bone loss (osteoporosis). Adopting a healthy lifestyle and getting preventive care can help to promote your health and wellness. The actions you take can also lower your chances of developing some of these common conditions. What are the signs and symptoms of menopause? During menopause, you may have the following symptoms: Hot flashes. These can be moderate or severe. Night sweats. Decrease in sex drive. Mood swings. Headaches. Tiredness (fatigue). Irritability. Memory problems. Problems falling asleep or staying asleep. Talk with your health care provider about treatment options for your symptoms. Do I need hormone replacement therapy? Hormone replacement therapy is effective in treating symptoms that are caused by menopause, such as hot flashes and night sweats. Hormone replacement carries certain risks, especially as you become older. If you are thinking about using estrogen or estrogen with progestin, discuss the benefits and risks with your health care provider. How can I reduce my risk for heart disease and stroke? The risk of heart disease, heart attack, and stroke increases as you age. One of the causes may be a change in the body's hormones during menopause. This  can affect how your body uses dietary fats, triglycerides, and cholesterol. Heart attack and stroke are medical emergencies. There are many things that you can do to help prevent heart disease and stroke. Watch your blood pressure High blood pressure causes heart disease and increases the risk of stroke. This is more likely to develop in people who have high blood pressure readings or are overweight. Have your blood pressure checked: Every 3-5 years if you are 40-55 years of age. Every year if you are 38 years old or older. Eat a healthy diet  Eat a diet that includes plenty of vegetables, fruits, low-fat dairy products, and lean protein. Do not eat a lot of foods that are high in solid fats, added sugars, or sodium. Get regular exercise Get regular exercise. This is one of the most important things you can do for your health. Most adults should: Try to exercise for at least 150 minutes each week. The exercise should increase your heart rate and make you sweat (moderate-intensity exercise). Try to do strengthening exercises at least twice each week. Do these in addition to the moderate-intensity exercise. Spend less time sitting. Even light physical activity can be beneficial. Other tips Work with your health care provider to achieve or maintain a healthy weight. Do not use any products that contain nicotine or tobacco. These products include cigarettes, chewing tobacco, and vaping devices, such as e-cigarettes. If you need help quitting, ask your health care provider. Know your numbers. Ask your health care provider to check your cholesterol and your blood sugar (glucose). Continue to have your blood tested as directed by your health care provider. Do I need screening for cancer? Depending on your health history and  family history, you may need to have cancer screenings at different stages of your life. This may include screening for: Breast cancer. Cervical cancer. Lung cancer. Colorectal  cancer. What is my risk for osteoporosis? After menopause, you may be at increased risk for osteoporosis. Osteoporosis is a condition in which bone destruction happens more quickly than new bone creation. To help prevent osteoporosis or the bone fractures that can happen because of osteoporosis, you may take the following actions: If you are 39-11 years old, get at least 1,000 mg of calcium and at least 600 international units (IU) of vitamin D per day. If you are older than age 4 but younger than age 23, get at least 1,200 mg of calcium and at least 600 international units (IU) of vitamin D per day. If you are older than age 22, get at least 1,200 mg of calcium and at least 800 international units (IU) of vitamin D per day. Smoking and drinking excessive alcohol increase the risk of osteoporosis. Eat foods that are rich in calcium and vitamin D, and do weight-bearing exercises several times each week as directed by your health care provider. How does menopause affect my mental health? Depression may occur at any age, but it is more common as you become older. Common symptoms of depression include: Feeling depressed. Changes in sleep patterns. Changes in appetite or eating patterns. Feeling an overall lack of motivation or enjoyment of activities that you previously enjoyed. Frequent crying spells. Talk with your health care provider if you think that you are experiencing any of these symptoms. General instructions See your health care provider for regular wellness exams and vaccines. This may include: Scheduling regular health, dental, and eye exams. Getting and maintaining your vaccines. These include: Influenza vaccine. Get this vaccine each year before the flu season begins. Pneumonia vaccine. Shingles vaccine. Tetanus, diphtheria, and pertussis (Tdap) booster vaccine. Your health care provider may also recommend other immunizations. Tell your health care provider if you have ever been  abused or do not feel safe at home. Summary Menopause is a normal process in which your ability to get pregnant comes to an end. This condition causes hot flashes, night sweats, decreased interest in sex, mood swings, headaches, or lack of sleep. Treatment for this condition may include hormone replacement therapy. Take actions to keep yourself healthy, including exercising regularly, eating a healthy diet, watching your weight, and checking your blood pressure and blood sugar levels. Get screened for cancer and depression. Make sure that you are up to date with all your vaccines. This information is not intended to replace advice given to you by your health care provider. Make sure you discuss any questions you have with your health care provider. Document Revised: 02/14/2021 Document Reviewed: 02/14/2021 Elsevier Patient Education  2024 [employer].

## 2023-07-19 NOTE — Assessment & Plan Note (Signed)
Deferred pelvic exam in the absence of complaints and Pap smear is up-to-date.  Patient has mammogram scheduled at [facility].  Congratulated patient on following healthy diet, maintaining weight.  Patient will obtain flu shot after bronchitis has resolved.

## 2023-07-19 NOTE — Progress Notes (Signed)
Assessment & Plan:  Routine physical examination Assessment & Plan: Deferred pelvic exam in the absence of complaints and Pap smear is up-to-date.  Patient has mammogram scheduled at [facility].  Congratulated patient on following healthy diet, maintaining weight.  Patient will obtain flu shot after bronchitis has resolved.   Orders: -     VITAMIN D 25 Hydroxy (Vit-D Deficiency, Fractures) -     Microalbumin / creatinine urine ratio -     Hemoglobin A1c -     CBC with Differential/Platelet -     Comprehensive metabolic panel -     Lipid panel -     TSH -     Doxycycline Hyclate; Take 1 tablet (100 mg total) by mouth 2 (two) times daily for 7 days.  Dispense: 14 tablet; Refill: 0 -     guaiFENesin-Codeine; Take 5 mLs by mouth at bedtime as needed for cough.  Dispense: 70 mL; Refill: 0  Bronchitis Assessment & Plan: Afebrile.  No acute respiratory distress.  Based on duration of symptoms, concern for bacterial URI.  Start doxycycline.  Provided Cheratussin to use at bedtime.  She will let me know how she is doing  Orders: -     Doxycycline Hyclate; Take 1 tablet (100 mg total) by mouth 2 (two) times daily for 7 days.  Dispense: 14 tablet; Refill: 0 -     guaiFENesin-Codeine; Take 5 mLs by mouth at bedtime as needed for cough.  Dispense: 70 mL; Refill: 0  Prediabetes -     Microalbumin / creatinine urine ratio -     Hemoglobin A1c -     CBC with Differential/Platelet -     Comprehensive metabolic panel  Encounter for lipid screening for cardiovascular disease -     Lipid panel -     TSH  History of vitamin D deficiency -     VITAMIN D 25 Hydroxy (Vit-D Deficiency, Fractures)     Return precautions given.   Risks, benefits, and alternatives of the medications and treatment plan prescribed today were discussed, and patient expressed understanding.   Education regarding symptom management and diagnosis given to patient on AVS either electronically or printed.  Return in  about 1 year (around 07/18/2024) for Complete Physical Exam.  Rennie Plowman, FNP  Subjective:    Patient ID: Bridget Peters, female    DOB: [birth year], 52 y.o.   MRN: [id number]  CC: Bridget Peters is a 52 y.o. female who presents today for physical exam.    HPI: Complains of productive cough, congestion x 15 days, waxing and waning.  Wheezing and cough when laying down to sleep.  Treated for viral sinusitis with prednisone 07/04/23 with improvement, not complete resolution.   Endorses mild sore throat   No fever, ear pain, SOB  No abx this year.       Colorectal Cancer Screening: UTD , Dr Servando Snare, repeat in 5 years Breast Cancer Screening: Mammogram UTD , [facility], s/p left breast biopsy; she is schedule for screening MM at work.   Cervical Cancer Screening: UTD, obtained 11/16/2021 negative malignancy, HPV Bone Health screening/DEXA for 65+: No increased fracture risk. Defer screening at this time.  Lung Cancer Screening: Doesn't have 20 year pack year history and age > 49 years yo 52 years        Tetanus - UTD        Exercise: Limited by exercise-induced asthma Alcohol use:  occasionally Smoking/tobacco use: Nonsmoker.    Health Maintenance  Topic Date Due   Mammogram  06/25/2022   Yearly kidney function blood test for diabetes  08/31/2022   Yearly kidney health urinalysis for diabetes  08/31/2022   Flu Shot  05/10/2023   Colon Cancer Screening  09/10/2025   Pap with HPV screening  11/16/2026   DTaP/Tdap/Td vaccine (3 - Td or Tdap) 11/17/2031   Hepatitis C Screening  Completed   HPV Vaccine  Aged Out   COVID-19 Vaccine  Discontinued   HIV Screening  Discontinued   Zoster (Shingles) Vaccine  Discontinued    ALLERGIES: Shellfish allergy, Iodinated contrast media, Milk-related compounds, Tamiflu [oseltamivir phosphate], Wheat, Covid-19 (adenovirus) vaccine, Acetazolamide, and Xolair [omalizumab]  Current Outpatient Medications on File Prior to Visit   Medication Sig Dispense Refill   albuterol (PROVENTIL) (2.5 MG/3ML) 0.083% nebulizer solution Take 3 mLs (2.5 mg total) by nebulization every 6 (six) hours as needed for wheezing or shortness of breath. 75 mL 12   amLODipine (NORVASC) 2.5 MG tablet TAKE 1 TABLET BY MOUTH EVERY DAY 90 tablet 0   buPROPion (WELLBUTRIN XL) 150 MG 24 hr tablet TAKE 1 TABLET BY MOUTH EVERY DAY 90 tablet 0   cetirizine (ZYRTEC) 10 MG tablet TAKE 1 TABLET BY MOUTH EVERY DAY 30 tablet 2   EPIPEN 2-PAK 0.3 MG/0.3ML SOAJ injection   11   fexofenadine (ALLEGRA) 180 MG tablet TAKE 1 TABLET BY MOUTH EVERY DAY 30 tablet 2   Fluticasone-Umeclidin-Vilant 100-62.5-25 MCG/ACT AEPB Inhale 1 Inhalation into the lungs daily.     gabapentin (NEURONTIN) 100 MG capsule TAKE 1 CAPSULE BY MOUTH EVERYDAY AT BEDTIME 90 capsule 3   levothyroxine (SYNTHROID) 75 MCG tablet TAKE 1 TABLET BY MOUTH EVERY DAY 90 tablet 0   mepolizumab (NUCALA) 100 MG/ML SOSY INJECT 1 SYRINGE UNDER THE SKIN EVERY 28 DAYS 1 mL 5   montelukast (SINGULAIR) 10 MG tablet TAKE 1 TABLET BY MOUTH EVERY DAY 90 tablet 1   Olopatadine HCl 0.2 % SOLN SMARTSIG:1 Drop(s) In Eye(s) Daily PRN     Olopatadine-Mometasone 665-25 MCG/ACT SUSP Place 2 sprays into the nose daily.     topiramate (TOPAMAX) 100 MG tablet Take 1 tablet (100 mg total) by mouth daily. 90 tablet 3   No current facility-administered medications on file prior to visit.    Review of Systems  Constitutional:  Negative for chills, fever and unexpected weight change.  HENT:  Positive for congestion and sore throat.   Respiratory:  Positive for cough and wheezing. Negative for shortness of breath.   Cardiovascular:  Negative for chest pain, palpitations and leg swelling.  Gastrointestinal:  Negative for nausea and vomiting.  Musculoskeletal:  Negative for arthralgias and myalgias.  Skin:  Negative for rash.  Neurological:  Negative for headaches.  Hematological:  Negative for adenopathy.   Psychiatric/Behavioral:  Negative for confusion.       Objective:    BP 136/76   Pulse 76   Temp 97.8 F (36.6 C) (Oral)   Ht 5\' 2"  (1.575 m)   Wt 159 lb 9.6 oz (72.4 kg)   SpO2 99%   BMI 29.19 kg/m   BP Readings from Last 3 Encounters:  07/19/23 136/76  12/05/22 (!) 147/86  10/16/22 108/74   Wt Readings from Last 3 Encounters:  07/19/23 159 lb 9.6 oz (72.4 kg)  12/05/22 158 lb (71.7 kg)  10/16/22 150 lb (68 kg)    Physical Exam Vitals reviewed.  Constitutional:      Appearance: Normal appearance. She is well-developed.  HENT:  Head: Normocephalic and atraumatic.     Right Ear: Hearing, tympanic membrane, ear canal and external ear normal. No decreased hearing noted. No drainage, swelling or tenderness. No middle ear effusion. No foreign body. Tympanic membrane is not erythematous or bulging.     Left Ear: Hearing, tympanic membrane, ear canal and external ear normal. No decreased hearing noted. No drainage, swelling or tenderness.  No middle ear effusion. No foreign body. Tympanic membrane is not erythematous or bulging.     Nose: Nose normal. No rhinorrhea.     Right Sinus: No maxillary sinus tenderness or frontal sinus tenderness.     Left Sinus: No maxillary sinus tenderness or frontal sinus tenderness.     Mouth/Throat:     Pharynx: Uvula midline. No oropharyngeal exudate or posterior oropharyngeal erythema.     Tonsils: No tonsillar abscesses.  Eyes:     Conjunctiva/sclera: Conjunctivae normal.  Neck:     Thyroid: No thyroid mass or thyromegaly.  Cardiovascular:     Rate and Rhythm: Normal rate and regular rhythm.     Pulses: Normal pulses.     Heart sounds: Normal heart sounds.  Pulmonary:     Effort: Pulmonary effort is normal.     Breath sounds: Normal breath sounds. No wheezing, rhonchi or rales.  Chest:  Breasts:    Breasts are symmetrical.     Right: No inverted nipple, mass, nipple discharge, skin change or tenderness.     Left: No inverted  nipple, mass, nipple discharge, skin change or tenderness.  Abdominal:     General: Bowel sounds are normal. There is no distension.     Palpations: Abdomen is soft. Abdomen is not rigid. There is no fluid wave or mass.     Tenderness: There is no abdominal tenderness. There is no guarding or rebound.  Lymphadenopathy:     Head:     Right side of head: No submental, submandibular, tonsillar, preauricular, posterior auricular or occipital adenopathy.     Left side of head: No submental, submandibular, tonsillar, preauricular, posterior auricular or occipital adenopathy.     Cervical: No cervical adenopathy.     Right cervical: No superficial, deep or posterior cervical adenopathy.    Left cervical: No superficial, deep or posterior cervical adenopathy.  Skin:    General: Skin is warm and dry.  Neurological:     Mental Status: She is alert.  Psychiatric:        Speech: Speech normal.        Behavior: Behavior normal.        Thought Content: Thought content normal.

## 2023-07-20 ENCOUNTER — Other Ambulatory Visit: Payer: Self-pay

## 2023-07-20 ENCOUNTER — Telehealth: Payer: Self-pay

## 2023-07-20 DIAGNOSIS — D582 Other hemoglobinopathies: Secondary | ICD-10-CM

## 2023-07-20 DIAGNOSIS — E7800 Pure hypercholesterolemia, unspecified: Secondary | ICD-10-CM

## 2023-07-20 NOTE — Telephone Encounter (Signed)
Called and left detailed VM informing pt provider would liek for her to schedule a 3 month lab appt and 2-3 days after lab schedule either in person ov or virtual ov with pcp :    Arnett, Lyn Records, FNP  P [facility] Discussed result note with patient Order A1c lipid panel and schedule in 3 months; please schedule follow-up appointment with me 2 or 3 days after lab collection ( virtual is okay)   Labs have already been ordered when pt calls back please have pt scheduled for a 3 month lab appt and an appt with pcp

## 2023-07-23 ENCOUNTER — Encounter: Payer: Self-pay | Admitting: Family

## 2023-07-25 LAB — HM MAMMOGRAPHY

## 2023-07-31 ENCOUNTER — Encounter: Payer: Self-pay | Admitting: Obstetrics and Gynecology

## 2023-08-10 ENCOUNTER — Telehealth: Payer: Self-pay

## 2023-08-10 NOTE — Telephone Encounter (Signed)
LVM to call back to inform pt she needs an in person office visit to discuss cervical pain can not be a my chart visit!

## 2023-08-10 NOTE — Telephone Encounter (Signed)
Patient scheduled an appointment via MyChart to see Rennie Plowman, FNP, on 08/22/2023.  Patient included the following comment:    "I am having cervical pain at the junction of C7-T1.  It is painful and burning."

## 2023-08-13 NOTE — Telephone Encounter (Signed)
Pt has appt scheduled in office for 08/22/23

## 2023-08-22 ENCOUNTER — Encounter: Payer: Self-pay | Admitting: Family

## 2023-08-22 ENCOUNTER — Ambulatory Visit: Payer: BC Managed Care – PPO

## 2023-08-22 ENCOUNTER — Ambulatory Visit: Payer: BC Managed Care – PPO | Admitting: Family

## 2023-08-22 VITALS — BP 134/72 | HR 89 | Temp 97.7°F | Ht 62.0 in | Wt 158.0 lb

## 2023-08-22 DIAGNOSIS — M542 Cervicalgia: Secondary | ICD-10-CM | POA: Diagnosis not present

## 2023-08-22 DIAGNOSIS — R059 Cough, unspecified: Secondary | ICD-10-CM | POA: Diagnosis not present

## 2023-08-22 DIAGNOSIS — J40 Bronchitis, not specified as acute or chronic: Secondary | ICD-10-CM | POA: Diagnosis not present

## 2023-08-22 DIAGNOSIS — J45909 Unspecified asthma, uncomplicated: Secondary | ICD-10-CM | POA: Diagnosis not present

## 2023-08-22 DIAGNOSIS — Z0000 Encounter for general adult medical examination without abnormal findings: Secondary | ICD-10-CM

## 2023-08-22 MED ORDER — TRAMADOL HCL 50 MG PO TABS: 50.0000 mg | ORAL_TABLET | Freq: Every day | ORAL | 0 refills | Status: AC | PRN

## 2023-08-22 MED ORDER — GUAIFENESIN-CODEINE 100-10 MG/5ML PO SYRP: 5.0000 mL | ORAL_SOLUTION | Freq: Every evening | ORAL | 0 refills | Status: DC | PRN

## 2023-08-22 MED ORDER — PREDNISONE 10 MG PO TABS: ORAL_TABLET | ORAL | 0 refills | Status: DC

## 2023-08-22 MED ORDER — CYCLOBENZAPRINE HCL 10 MG PO TABS: 10.0000 mg | ORAL_TABLET | Freq: Every evening | ORAL | 0 refills | Status: DC | PRN

## 2023-08-22 MED ORDER — AMOXICILLIN-POT CLAVULANATE 875-125 MG PO TABS: 1.0000 | ORAL_TABLET | Freq: Two times a day (BID) | ORAL | 0 refills | Status: AC

## 2023-08-22 NOTE — Assessment & Plan Note (Signed)
Known spinal stenosis, arthrosis.  Suspect acute on chronic exacerbation.  Start prednisone for bronchitis and also back pain.  Long discussion in regards to as needed medications to take for discomfort.  Provided her with tramadol to use sparingly and Flexeril.  We discussed not taking either these medications together or with gabapentin due to sedating properties

## 2023-08-22 NOTE — Progress Notes (Signed)
Assessment & Plan:  Bronchitis Assessment & Plan: Fever has resolved.  COVID home test negative.  History of asthma. Pending chest x-ray to evaluate for pneumonia.  Start prednisone, Augmentin.  Provided a refill of Cheratussin and counseled on sedating properties.  Orders: -     DG Chest 2 View; Future -     Amoxicillin-Pot Clavulanate; Take 1 tablet by mouth 2 (two) times daily for 7 days.  Dispense: 14 tablet; Refill: 0 -     predniSONE; Take 4 tablets ( total 40 mg) by mouth for 2 days; take 3 tablets ( total 30 mg) by mouth for 2 days; take 2 tablets ( total 20 mg) by mouth for 1 day; take 1 tablet ( total 10 mg) by mouth for 1 day.  Dispense: 17 tablet; Refill: 0 -     guaiFENesin-Codeine; Take 5 mLs by mouth at bedtime as needed for cough.  Dispense: 70 mL; Refill: 0  Routine physical examination  Neck pain Assessment & Plan: Known spinal stenosis, arthrosis.  Suspect acute on chronic exacerbation.  Start prednisone for bronchitis and also back pain.  Long discussion in regards to as needed medications to take for discomfort.  Provided her with tramadol to use sparingly and Flexeril.  We discussed not taking either these medications together or with gabapentin due to sedating properties  Orders: -     predniSONE; Take 4 tablets ( total 40 mg) by mouth for 2 days; take 3 tablets ( total 30 mg) by mouth for 2 days; take 2 tablets ( total 20 mg) by mouth for 1 day; take 1 tablet ( total 10 mg) by mouth for 1 day.  Dispense: 17 tablet; Refill: 0 -     Cyclobenzaprine HCl; Take 1 tablet (10 mg total) by mouth at bedtime as needed for muscle spasms.  Dispense: 30 tablet; Refill: 0 -     traMADol HCl; Take 1 tablet (50 mg total) by mouth daily as needed for up to 5 days for severe pain (pain score 7-10).  Dispense: 15 tablet; Refill: 0     Return precautions given.   Risks, benefits, and alternatives of the medications and treatment plan prescribed today were discussed, and patient  expressed understanding.   Education regarding symptom management and diagnosis given to patient on AVS either electronically or printed.  No follow-ups on file.  Rennie Plowman, FNP  Subjective:    Patient ID: Drue Second, female    DOB: 1971-01-25, 52 y.o.   MRN: [id number]  CC: Bridget Peters is a 52 y.o. female who presents today for an acute visit.    HPI: Complains of posterior neck pain, worse over the past 4 weeks.  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   Endorses tingling at 'base of neck'  She has tried tylenol and ibuprofen wihtout relief. She has tried gabapentin 100mg  without relief  Heat with temporarily relief.   No weakness or numbness in arms.   She has complains of productive cough x 4 days  Endorses wheezing at night when laying down.   No cp, left arm or jaw numbness,    Negative covid test 3 days ago Fever 3 days ago, tmax 103, since resolved    She hasn't had to use albuterol nebulizer solution. She is using albuterol inhaler every 3 hours.   Compliant with trelegy inhaler, Zyrtec 10 mg daily, nucula , singulair,   h/o asthma  Allergic to influenza and  covid vaccines   Given doxycycline, cheratussin 07/2023  Given prednisone 07/04/23  MR cervical spine 10/2019 Disc protrusion C6-C7, mild spinal stenosis, moderate to severe right facet arthrosis C3-C4 and C4-C5    Allergies: Shellfish allergy, Iodinated contrast media, Milk-related compounds, Tamiflu [oseltamivir phosphate], Wheat, Covid-19 (adenovirus) vaccine, Acetazolamide, and Xolair [omalizumab] Current Outpatient Medications on File Prior to Visit  Medication Sig Dispense Refill   albuterol (PROVENTIL) (2.5 MG/3ML) 0.083% nebulizer solution Take 3 mLs (2.5 mg total) by nebulization every 6 (six) hours as needed for wheezing or shortness of breath. 75 mL 12   amLODipine (NORVASC) 2.5 MG tablet TAKE 1 TABLET BY MOUTH EVERY DAY 90  tablet 0   buPROPion (WELLBUTRIN XL) 150 MG 24 hr tablet TAKE 1 TABLET BY MOUTH EVERY DAY 90 tablet 0   cetirizine (ZYRTEC) 10 MG tablet TAKE 1 TABLET BY MOUTH EVERY DAY 30 tablet 2   EPIPEN 2-PAK 0.3 MG/0.3ML SOAJ injection   11   fexofenadine (ALLEGRA) 180 MG tablet TAKE 1 TABLET BY MOUTH EVERY DAY 30 tablet 2   Fluticasone-Umeclidin-Vilant 100-62.5-25 MCG/ACT AEPB Inhale 1 Inhalation into the lungs daily.     gabapentin (NEURONTIN) 100 MG capsule TAKE 1 CAPSULE BY MOUTH EVERYDAY AT BEDTIME 90 capsule 3   levothyroxine (SYNTHROID) 75 MCG tablet TAKE 1 TABLET BY MOUTH EVERY DAY 90 tablet 0   mepolizumab (NUCALA) 100 MG/ML SOSY INJECT 1 SYRINGE UNDER THE SKIN EVERY 28 DAYS 1 mL 5   montelukast (SINGULAIR) 10 MG tablet TAKE 1 TABLET BY MOUTH EVERY DAY 90 tablet 1   Olopatadine HCl 0.2 % SOLN SMARTSIG:1 Drop(s) In Eye(s) Daily PRN     Olopatadine-Mometasone 665-25 MCG/ACT SUSP Place 2 sprays into the nose daily.     topiramate (TOPAMAX) 100 MG tablet Take 1 tablet (100 mg total) by mouth daily. 90 tablet 3   No current facility-administered medications on file prior to visit.    Review of Systems  Constitutional:  Negative for chills and fever.  HENT:  Positive for congestion. Negative for sinus pain.   Respiratory:  Positive for cough and wheezing. Negative for shortness of breath.   Cardiovascular:  Negative for chest pain and palpitations.  Gastrointestinal:  Negative for nausea and vomiting.  Musculoskeletal:  Positive for neck pain.  Neurological:  Negative for numbness.      Objective:    BP 134/72   Pulse 89   Temp 97.7 F (36.5 C) (Oral)   Ht 5\' 2"  (1.575 m)   Wt 158 lb (71.7 kg)   SpO2 99%   BMI 28.90 kg/m   BP Readings from Last 3 Encounters:  08/22/23 134/72  07/19/23 136/76  12/05/22 (!) 147/86   Wt Readings from Last 3 Encounters:  08/22/23 158 lb (71.7 kg)  07/19/23 159 lb 9.6 oz (72.4 kg)  12/05/22 158 lb (71.7 kg)    Physical Exam Vitals reviewed.   Constitutional:      Appearance: She is well-developed.  HENT:     Head: Normocephalic and atraumatic.     Right Ear: Hearing, tympanic membrane, ear canal and external ear normal. No decreased hearing noted. No drainage, swelling or tenderness. No middle ear effusion. No foreign body. Tympanic membrane is not erythematous or bulging.     Left Ear: Hearing, tympanic membrane, ear canal and external ear normal. No decreased hearing noted. No drainage, swelling or tenderness.  No middle ear effusion. No foreign body. Tympanic membrane is not erythematous or bulging.     Nose:  Nose normal. No rhinorrhea.     Right Sinus: No maxillary sinus tenderness or frontal sinus tenderness.     Left Sinus: No maxillary sinus tenderness or frontal sinus tenderness.     Mouth/Throat:     Pharynx: Uvula midline. No oropharyngeal exudate or posterior oropharyngeal erythema.     Tonsils: No tonsillar abscesses.  Eyes:     Conjunctiva/sclera: Conjunctivae normal.  Neck:      Comments: No edema, increased warmth, rash Cardiovascular:     Rate and Rhythm: Regular rhythm.     Pulses: Normal pulses.     Heart sounds: Normal heart sounds.  Pulmonary:     Effort: Pulmonary effort is normal.     Breath sounds: Normal breath sounds. No wheezing, rhonchi or rales.  Musculoskeletal:     Cervical back: No torticollis. Muscular tenderness present. No pain with movement or spinous process tenderness.  Lymphadenopathy:     Head:     Right side of head: No submental, submandibular, tonsillar, preauricular, posterior auricular or occipital adenopathy.     Left side of head: No submental, submandibular, tonsillar, preauricular, posterior auricular or occipital adenopathy.     Cervical: No cervical adenopathy.  Skin:    General: Skin is warm and dry.  Neurological:     Mental Status: She is alert.  Psychiatric:        Speech: Speech normal.        Behavior: Behavior normal.        Thought Content: Thought content  normal.

## 2023-08-22 NOTE — Assessment & Plan Note (Signed)
Fever has resolved.  COVID home test negative.  History of asthma. Pending chest x-ray to evaluate for pneumonia.  Start prednisone, Augmentin.  Provided a refill of Cheratussin and counseled on sedating properties.

## 2023-08-22 NOTE — Patient Instructions (Signed)
For cough, I provided you with prednisone to start this morning.  Please ensure that you get all doses in the morning as prednisone can interfere with sleep.  You may go ahead and start Augmentin.    Ensure to take probiotics while on antibiotics and also for 2 weeks after completion. This can either be by eating yogurt daily or taking a probiotic supplement over the counter such as Culturelle.It is important to re-colonize the gut with good bacteria and also to prevent any diarrheal infections associated with antibiotic use.    Will await chest x-ray to ensure you do not also have pneumonia  I also provided you with Cheratussin cough syrup  Please take cough medication at night only as needed. As we discussed, I do not recommend dosing throughout the day as coughing is a protective mechanism . It also helps to break up thick mucous.  Do not take cough suppressants with alcohol as can lead to trouble breathing. Advise caution if taking cough suppressant and operating machinery ( i.e driving a car) as you may feel very tired.    For neck pain, as discussed we are using prednisone taper to hopefully calm down your symptoms and pain.  I provided you with Flexeril and muscle relaxant to use at bedtime.  Do not take Flexeril with gabapentin or tramadol as this would be overly sedating. Do not drive or operate heavy machinery while on muscle relaxant. Please do not drink alcohol. Only take this medication as needed for acute muscle spasm at bedtime. This medication make you feel drowsy so be very careful.  Stop taking if become too drowsy or somnolent as this puts you at risk for falls. Please contact our office with any questions.   I also provided you with a nonopioid controlled substance tramadol to use sparingly for moderate to severe pain.  Please do not use tramadol with gabapentin or Flexeril.  Please do not use tramadol with alcohol.   Please let me know how you are doing!

## 2023-08-23 ENCOUNTER — Encounter: Payer: Self-pay | Admitting: Family

## 2023-09-03 ENCOUNTER — Encounter: Payer: Self-pay | Admitting: Internal Medicine

## 2023-09-03 MED ORDER — FLUTICASONE-UMECLIDIN-VILANT 100-62.5-25 MCG/ACT IN AEPB: 1.0000 | INHALATION_SPRAY | Freq: Every day | RESPIRATORY_TRACT | 5 refills | Status: DC

## 2023-09-14 ENCOUNTER — Ambulatory Visit (INDEPENDENT_AMBULATORY_CARE_PROVIDER_SITE_OTHER): Payer: BC Managed Care – PPO

## 2023-09-14 DIAGNOSIS — Z23 Encounter for immunization: Secondary | ICD-10-CM

## 2023-09-14 NOTE — Progress Notes (Signed)
Patient presented for Egg free flu vaccine to left deltoid, patient voiced no concerns nor showed any signs of distress during injection

## 2023-09-17 ENCOUNTER — Telehealth: Payer: Self-pay

## 2023-09-17 ENCOUNTER — Other Ambulatory Visit: Payer: Self-pay | Admitting: Family

## 2023-09-17 DIAGNOSIS — M542 Cervicalgia: Secondary | ICD-10-CM

## 2023-09-17 NOTE — Telephone Encounter (Signed)
Lvm for pt to give office a call back in regards to chest xray.     Remee,   Chest x-ray does not reveal pneumonia.  Hope that you have a wonderful Thanksgiving.     Regards, Claris Che

## 2023-09-17 NOTE — Telephone Encounter (Signed)
-----   Message from Rennie Plowman sent at 09/17/2023  9:07 AM EST ----- Call patient Patient has not viewed MyChart result note.  Please review my chart note in detail with patient.   Please let me know if questions

## 2023-09-17 NOTE — Telephone Encounter (Signed)
Pt called back and I read the note and she understood

## 2023-09-19 ENCOUNTER — Ambulatory Visit: Payer: BC Managed Care – PPO | Admitting: Internal Medicine

## 2023-09-19 ENCOUNTER — Encounter: Payer: Self-pay | Admitting: Internal Medicine

## 2023-09-19 VITALS — BP 136/89 | HR 81 | Temp 97.6°F | Ht 62.0 in | Wt 157.0 lb

## 2023-09-19 DIAGNOSIS — G4733 Obstructive sleep apnea (adult) (pediatric): Secondary | ICD-10-CM | POA: Diagnosis not present

## 2023-09-19 DIAGNOSIS — J4520 Mild intermittent asthma, uncomplicated: Secondary | ICD-10-CM | POA: Diagnosis not present

## 2023-09-19 NOTE — Progress Notes (Signed)
@Patient  ID: Bridget Peters, female    DOB: 1971-08-19, 52 y.o.   MRN: [id number]   SYNOPSIS 52 year old female, never smoked. PMH significant for moderate persistent asthma, OSA, sinusitis, HTN, aortic atherosclerosis, type 2 diabetes, hypothyroidism.  ASTHMA Maintained on Spiriva respimat 1.25mcg, Singualir, Allergra, Flonase and prn ventolin. NUCALA INJECTIONS   CC Follow-up assessment for asthma  Follow up ASTHMA  HPI Overall asthma is well-controlled Receiving Nucala injections through allergy immunology Started on Trelegy inhaler therapy is working well Patient did not tolerate Symbicort Uses albuterol as needed  Patient with previous history of sinus infections Previous sinus surgery for maxillary cyst No follow-up ENT necessary at this time  No exacerbation at this time No evidence of heart failure at this time No evidence or signs of infection at this time No respiratory distress No fevers, chills, nausea, vomiting, diarrhea No evidence of lower extremity edema No evidence hemoptysis    Allergies  Allergen Reactions   Shellfish Allergy Hives and Shortness Of Breath   Iodinated Contrast Media Hives    Pt developed hives approximately 6 hours after receiving Isovue 300 for CT Scan. She was given 50mG  oral Benadryl and symptoms improved   Milk-Related Compounds Hives   Tamiflu [Oseltamivir Phosphate] Hives   Wheat Hives   Covid-19 (Adenovirus) Vaccine     Pfizer   Acetazolamide Rash   Xolair [Omalizumab] Itching    Immunization History  Administered Date(s) Administered   Influenza Split 07/03/2016   Influenza, Mdck, Trivalent,PF 6+ MOS(egg free) 09/14/2023   Influenza, Seasonal, Injecte, Preservative Fre 08/05/2015   Influenza,inj,Quad PF,6+ Mos 07/26/2020, 07/06/2021, 07/31/2022   PFIZER(Purple Top)SARS-COV-2 Vaccination 10/13/2019   Pneumococcal Conjugate-13 04/24/2017   Pneumococcal Polysaccharide-23 02/13/2018   Pneumococcal-Unspecified  02/12/2012   Tdap 10/11/2009, 11/16/2021    Past Medical History:  Diagnosis Date   Allergy    Seasonal   Asthma    Complication of anesthesia    nausea and vomitting   Depression    Diabetes mellitus without complication (HCC)    Family history of adverse reaction to anesthesia    nausea and vomitting   Hypertension    Hypothyroidism    Pneumonia    PONV (postoperative nausea and vomiting)    Shortness of breath    Sleep apnea    Thyroid disease     Tobacco History: Social History   Tobacco Use  Smoking Status Never  Smokeless Tobacco Never   Counseling given: Not Answered   Outpatient Medications Prior to Visit  Medication Sig Dispense Refill   albuterol (PROVENTIL) (2.5 MG/3ML) 0.083% nebulizer solution Take 3 mLs (2.5 mg total) by nebulization every 6 (six) hours as needed for wheezing or shortness of breath. 75 mL 12   amLODipine (NORVASC) 2.5 MG tablet TAKE 1 TABLET BY MOUTH EVERY DAY 90 tablet 0   buPROPion (WELLBUTRIN XL) 150 MG 24 hr tablet TAKE 1 TABLET BY MOUTH EVERY DAY 90 tablet 0   cetirizine (ZYRTEC) 10 MG tablet TAKE 1 TABLET BY MOUTH EVERY DAY 30 tablet 2   cyclobenzaprine (FLEXERIL) 10 MG tablet Take 0.5-1 tablets (5-10 mg total) by mouth at bedtime as needed for muscle spasms. 30 tablet 2   EPIPEN 2-PAK 0.3 MG/0.3ML SOAJ injection   11   fexofenadine (ALLEGRA) 180 MG tablet TAKE 1 TABLET BY MOUTH EVERY DAY 30 tablet 2   Fluticasone-Umeclidin-Vilant 100-62.5-25 MCG/ACT AEPB Inhale 1 Inhalation into the lungs daily. 60 each 5   gabapentin (NEURONTIN) 100 MG capsule TAKE 1  CAPSULE BY MOUTH EVERYDAY AT BEDTIME 90 capsule 3   guaiFENesin-codeine (ROBITUSSIN AC) 100-10 MG/5ML syrup Take 5 mLs by mouth at bedtime as needed for cough. 70 mL 0   levothyroxine (SYNTHROID) 75 MCG tablet TAKE 1 TABLET BY MOUTH EVERY DAY 90 tablet 0   mepolizumab (NUCALA) 100 MG/ML SOSY INJECT 1 SYRINGE UNDER THE SKIN EVERY 28 DAYS 1 mL 5   montelukast (SINGULAIR) 10 MG tablet  TAKE 1 TABLET BY MOUTH EVERY DAY 90 tablet 1   Olopatadine HCl 0.2 % SOLN SMARTSIG:1 Drop(s) In Eye(s) Daily PRN     Olopatadine-Mometasone 665-25 MCG/ACT SUSP Place 2 sprays into the nose daily.     predniSONE (DELTASONE) 10 MG tablet Take 4 tablets ( total 40 mg) by mouth for 2 days; take 3 tablets ( total 30 mg) by mouth for 2 days; take 2 tablets ( total 20 mg) by mouth for 1 day; take 1 tablet ( total 10 mg) by mouth for 1 day. 17 tablet 0   topiramate (TOPAMAX) 100 MG tablet Take 1 tablet (100 mg total) by mouth daily. 90 tablet 3   No facility-administered medications prior to visit.      CBC    Component Value Date/Time   WBC 5.5 07/19/2023 1024   RBC 4.78 07/19/2023 1024   HGB 13.9 07/19/2023 1024   HCT 41.9 07/19/2023 1024   PLT 256.0 07/19/2023 1024   MCV 87.8 07/19/2023 1024   MCH 29.4 03/25/2020 0837   MCHC 33.2 07/19/2023 1024   RDW 12.9 07/19/2023 1024   LYMPHSABS 1.9 07/19/2023 1024   MONOABS 0.3 07/19/2023 1024   EOSABS 0.0 07/19/2023 1024   BASOSABS 0.0 07/19/2023 1024    BMET    Component Value Date/Time   NA 140 07/19/2023 1024   K 4.0 07/19/2023 1024   CL 108 07/19/2023 1024   CO2 23 07/19/2023 1024   GLUCOSE 129 (H) 07/19/2023 1024   BUN 18 07/19/2023 1024   CREATININE 0.68 07/19/2023 1024   CREATININE 0.90 05/14/2017 1220   CALCIUM 9.4 07/19/2023 1024   GFRNONAA >60 06/06/2018 0721   GFRAA >60 06/06/2018 0721    BP 136/89 (BP Location: Left Arm, Patient Position: Sitting, Cuff Size: Normal)   Pulse 81   Temp 97.6 F (36.4 C) (Temporal)   Ht 5\' 2"  (1.575 m)   Wt 157 lb (71.2 kg)   SpO2 100%   BMI 28.72 kg/m      Review of Systems: Gen:  Denies  fever, sweats, chills weight loss  HEENT: Denies blurred vision, double vision, ear pain, eye pain, hearing loss, nose bleeds, sore throat Cardiac:  No dizziness, chest pain or heaviness, chest tightness,edema, No JVD Resp:   No cough, -sputum production, -shortness of breath,-wheezing,  -hemoptysis,  Other:  All other systems negative   Physical Examination:   General Appearance: No distress  EYES PERRLA, EOM intact.   NECK Supple, No JVD Pulmonary: normal breath sounds, No wheezing.  CardiovascularNormal S1,S2.  No m/r/g.   Abdomen: Benign, Soft, non-tender. Neurology UE/LE 5/5 strength, no focal deficits Ext pulses intact, cap refill intact ALL OTHER ROS ARE NEGATIVE        Assessment & Plan:     52 year old pleasant white female seen today for follow-up assessment for moderate persistent asthma with underlying sleep apnea currently on immunotherapy   Asthma moderate persistent  Continue Nucala home injections every 4 weeks  Patient benefits from this to avoid hospitalizations and asthma exacerbations  Continue Trelegy  inhaler as prescribed  Continue albuterol as needed  Continue Singulair Flonase Allegra Zyrtec as directed   OSA  Patient with a diagnosis of moderate sleep apnea and does not want to pursue CPAP at this time  Oral appliance or inspire device are options in the future    GERD Protonix refilled   MEDICATION ADJUSTMENTS/LABS AND TESTS ORDERED: Continue Nucala as prescribed Continue inhalers as prescribed Avoid secondhand smoke Avoid SICK contacts Recommend  Masking  when appropriate Recommend Keep up-to-date with vaccinations     CURRENT MEDICATIONS REVIEWED AT LENGTH WITH PATIENT TODAY   Patient  satisfied with Plan of action and management. All questions answered  Follow-up in 1 year  Total Time Spent  31 mins   Saafir Abdullah Santiago Glad, M.D.  [facility]  Medical Director [facility] Medical Director [facility] Cardio-Pulmonary Department

## 2023-09-19 NOTE — Patient Instructions (Signed)
Continue Nucala as prescribed Continue inhalers as prescribed Avoid secondhand smoke Avoid SICK contacts Recommend  Masking  when appropriate Recommend Keep up-to-date with vaccinations

## 2023-10-17 ENCOUNTER — Other Ambulatory Visit: Payer: Self-pay | Admitting: Internal Medicine

## 2023-10-17 DIAGNOSIS — J45901 Unspecified asthma with (acute) exacerbation: Secondary | ICD-10-CM

## 2023-11-14 ENCOUNTER — Other Ambulatory Visit: Payer: Self-pay | Admitting: Family

## 2023-11-15 ENCOUNTER — Encounter: Payer: Self-pay | Admitting: Internal Medicine

## 2023-11-16 ENCOUNTER — Other Ambulatory Visit (HOSPITAL_COMMUNITY): Payer: Self-pay

## 2023-11-16 ENCOUNTER — Telehealth: Payer: Self-pay | Admitting: Pharmacist

## 2023-11-16 DIAGNOSIS — J4540 Moderate persistent asthma, uncomplicated: Secondary | ICD-10-CM

## 2023-11-16 NOTE — Telephone Encounter (Signed)
 Patient with new new insurance. No longer has [employer]. Submitted an URGENT Prior Authorization request to [employer] for NUCALA  via CoverMyMeds. Will update once we receive a response.  Key: CRISTINA Sherry Pennant, PharmD, MPH, BCPS, CPP Clinical Pharmacist (Rheumatology and Pulmonology)

## 2023-11-19 ENCOUNTER — Other Ambulatory Visit (HOSPITAL_COMMUNITY): Payer: Self-pay

## 2023-11-19 MED ORDER — NUCALA 100 MG/ML ~~LOC~~ SOSY: 100.0000 mg | PREFILLED_SYRINGE | SUBCUTANEOUS | 7 refills | Status: DC

## 2023-11-19 NOTE — Telephone Encounter (Signed)
 Received notification from [employer] regarding a prior authorization for NUCALA . Authorization has been APPROVED from 11/16/2023 to 11/15/2024. Approval letter sent to scan center.  Unable to run test claim because patient must fill through [facility]: [phone]   Authorization # [id number]  Rx sent to [facility] today  MyChart message sent to patient with update. She will need to provide pharmacy with savings card information. Patient was on Nucala  prior to our clinic taking over care so we never enrolled her into copay card.  Geraldene Kleine, PharmD, MPH, BCPS, CPP Clinical Pharmacist (Rheumatology and Pulmonology)

## 2023-11-20 NOTE — Telephone Encounter (Signed)
Appears that previous PA for autoinjector does not cover PFS which patient is taking  Submitted a Prior Authorization request to [facility] for NUCALA via CoverMyMeds. Will update once we receive a response.  Key: [id number]

## 2023-11-21 ENCOUNTER — Encounter: Payer: Self-pay | Admitting: Family

## 2023-11-21 NOTE — Telephone Encounter (Signed)
Printed off paperwork that needs signature placed in provider folder

## 2023-11-21 NOTE — Telephone Encounter (Addendum)
 Received notification from [facility] regarding a prior authorization for NUCALA. Authorization has been APPROVED from 11/20/2023 to 11/19/2024. Approval letter sent to scan center.  Patient must fill through [facility]: [phone]   Authorization # [id number]  Chesley Mires, PharmD, MPH, BCPS, CPP Clinical Pharmacist (Rheumatology and Pulmonology)

## 2023-11-22 ENCOUNTER — Other Ambulatory Visit: Payer: Self-pay | Admitting: Family

## 2023-11-22 ENCOUNTER — Other Ambulatory Visit: Payer: Self-pay | Admitting: Internal Medicine

## 2023-11-22 DIAGNOSIS — E039 Hypothyroidism, unspecified: Secondary | ICD-10-CM

## 2023-11-26 ENCOUNTER — Other Ambulatory Visit: Payer: Self-pay | Admitting: Family

## 2023-12-13 NOTE — Progress Notes (Signed)
 Chief Complaint  Patient presents with   Follow-up    Pt in 1 Pt here for carpal tunnel  f/u  Pt states same since last visit     HISTORY OF PRESENT ILLNESS:  12/18/23 ALL:  Bridget Peters returns for follow up for history of IIH, headaches and paresthesias. She was last seen 11/2022. Headaches were well managed and we continued topiramate 100mg  at bedtime. Referral to ortho placed for concerns of CTS. PT recommended for neck pain and she wished to discuss with PCP.   Since, she reports doing fairly well. Headaches are stable. She may have 2-3 headache days a month. Excedrin helps abort headache. Eye exam with optometry recently was unremarkable. Rx changed in lenses. She reports feeling that peripheral vision of right eye is different over the past month. No clear vision loss but she reports she can see more with left eye than with right. She is having significant difficulty with dry eye and allergies.    Neck pain is getting worse. She is being followed by PCP. Started on steroid taper and muscle relaxers. She is considering PT. She is on gabapentin for bursitis of left hip.   CTS is stable. She was told she needed surgery but she can not be out of work right now. Brace seems to help.    12/05/2022 ALL:  Bridget Peters returns for follow up for history of IIH, headaches and paresthesias. She was last seen 05/2022. Headaches were well managed and we continued topiramate 100mg  QHS. She was having more numbness and tingling in right hand. NCS/EMG performed at [facility] showed carpal tunnel.   Wrist brace recommended. She does not feel brace has helped much. PCP started gabapentin for hip pain but she only takes as needed. Headaches are well managed. She does complain of brain fog and word finding difficulty. Eye exam 08/2022 was normal. No vision changes.   She also reports a new pain in right side of her neck. She describes a tightness that extends to right trapezius. Sometimes it makes back of  neck/shoulder feel numb. 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   05/29/2022 ALL: Bridget Peters is a 53 y.o. female here today for follow up for history of IIH and paresthesias. She was last seen 01/2020. LP was normal. She was advised to continue topiramate 100mg  BID but called 03/2020 reporting more brain fog. Dr Epimenio Foot suggested weaning dose. She has not been seen since.   She presents today reporting worsening numbness of right hand over the past 2-3 months. She describes a tingling feeling, like her hand is going to sleep. She notices this more when arm is in elevated position but can occur intermittently throughout the day. She has noticed some weakness of right grip. She is right handed. She first noticed right arm and right leg numbness after Covid vaccination in 2021. She works full time as Ship broker.   She reports that headaches are stable. She has continued topiramate 100mg  daily (rx written for BID). She feels that headaches are well managed. Eye exam in 10/2021. She received new prescription glasses but states no optic nerve edema. She does report some blurred vision recently but no vision loss or TVO. She has lost 30-40lbs since diagnosis.   HISTORY (copied from Dr Bonnita Hollow previous note)  Bridget Peters is a 53 year old woman with numbness, visual changes, headaches and other symptoms   Update 01/27/2020: At the last  visit, we started Topiramate 50 mg po bid.  She has tingling from the knees down and in the right hand/arm.  The left arm feels heavy but is not tingling.     She has noted issues with word finding over the past 2 weeks.     Headaches were doing better initially after starting topiramate (and also after the LP) but not more recently.      LP showed OP=250 11/10/2019 and she felt better after the LP.    She feels current symptoms are similar to how she felt prior to the LP.    At that time, she also noted a mental fog with some word finding errors.   REVIEW OF SYSTEMS: Out of a complete 14 system review of symptoms, the patient complains only of the following symptoms, numbness, tingling and weakness of right hand and all other reviewed systems are negative.   ALLERGIES: Allergies  Allergen Reactions   Shellfish Allergy Hives and Shortness Of Breath   Iodinated Contrast Media Hives    Pt developed hives approximately 6 hours after receiving Isovue 300 for CT Scan. She was given 50mG  oral Benadryl and symptoms improved   Milk-Related Compounds Hives   Tamiflu [Oseltamivir Phosphate] Hives   Wheat Hives   Covid-19 (Adenovirus) Vaccine     Pfizer   Acetazolamide Rash   Xolair [Omalizumab] Itching     HOME MEDICATIONS: Outpatient Medications Prior to Visit  Medication Sig Dispense Refill   albuterol (PROVENTIL) (2.5 MG/3ML) 0.083% nebulizer solution INHALE 3 ML BY NEBULIZATION EVERY 6 HOURS AS NEEDED FOR WHEEZING OR SHORTNESS OF BREATH 75 mL 12   amLODipine (NORVASC) 2.5 MG tablet TAKE 1 TABLET BY MOUTH EVERY DAY 90 tablet 0   buPROPion (WELLBUTRIN XL) 150 MG 24 hr tablet TAKE 1 TABLET BY MOUTH EVERY DAY 90 tablet 3   cetirizine (ZYRTEC) 10 MG tablet TAKE 1 TABLET BY MOUTH EVERY DAY 30 tablet 2   cyclobenzaprine (FLEXERIL) 10 MG tablet Take 0.5-1 tablets (5-10 mg total) by mouth at bedtime as needed for muscle spasms. 30 tablet 2   EPIPEN 2-PAK 0.3 MG/0.3ML SOAJ injection   11   fexofenadine (ALLEGRA) 180 MG tablet TAKE 1 TABLET BY MOUTH EVERY DAY 30 tablet 2   gabapentin (NEURONTIN) 100 MG capsule TAKE 1 CAPSULE BY MOUTH EVERYDAY AT BEDTIME 90 capsule 3   levothyroxine (SYNTHROID) 75 MCG tablet TAKE 1 TABLET BY MOUTH EVERY DAY 90 tablet 1   mepolizumab (NUCALA) 100 MG/ML SOSY Inject 100 mg into the skin every 28 (twenty-eight) days. 1 mL 7   montelukast (SINGULAIR) 10 MG tablet TAKE 1 TABLET BY MOUTH EVERY DAY 90 tablet 1   Fluticasone-Umeclidin-Vilant  100-62.5-25 MCG/ACT AEPB Inhale 1 Inhalation into the lungs daily. 60 each 5   Olopatadine HCl 0.2 % SOLN SMARTSIG:1 Drop(s) In Eye(s) Daily PRN     Olopatadine-Mometasone 665-25 MCG/ACT SUSP Place 2 sprays into the nose daily.     topiramate (TOPAMAX) 100 MG tablet Take 1 tablet (100 mg total) by mouth daily. 90 tablet 3   No facility-administered medications prior to visit.     PAST MEDICAL HISTORY: Past Medical History:  Diagnosis Date   Allergy    Seasonal   Asthma    Complication of anesthesia    nausea and vomitting   Depression    Diabetes mellitus without complication (HCC)    Family history of adverse reaction to anesthesia    nausea and vomitting   Hypertension  Hypothyroidism    Pneumonia    PONV (postoperative nausea and vomiting)    Shortness of breath    Sleep apnea    Thyroid disease      PAST SURGICAL HISTORY: Past Surgical History:  Procedure Laterality Date   BREAST SURGERY  01/2013   Breast Reduction    COLONOSCOPY WITH PROPOFOL N/A 09/10/2020   Procedure: COLONOSCOPY WITH PROPOFOL;  Surgeon: Midge Minium, MD;  Location: [facility];  Service: Endoscopy;  Laterality: N/A;  priority 4   NASAL SINUS SURGERY     NASAL SINUS SURGERY  02/2014   REDUCTION MAMMAPLASTY Bilateral 2014   WISDOM TOOTH EXTRACTION     x4     FAMILY HISTORY: Family History  Problem Relation Age of Onset   Hypertension Mother    Endometrial cancer Mother 86   Hypertension Father    Colon polyps Father    Cancer Maternal Aunt        Breast Cancer   Breast cancer Maternal Aunt    Breast cancer Paternal Aunt        Breast Cancer   Cancer Maternal Grandmother 24       Bladder Cancer   Stroke Maternal Grandmother    Breast cancer Maternal Grandmother 60   Cancer Maternal Grandfather 68       Esophagus Cancer   Colon cancer Neg Hx    Ovarian cancer Neg Hx    Carpal tunnel syndrome Neg Hx      SOCIAL HISTORY: Social History   Socioeconomic History    Marital status: Married    Spouse name: Ted   Number of children: Not on file   Years of education: 18   Highest education level: Bachelor's degree (e.g., BA, AB, BS)  Occupational History   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 Deborra Medina)   Right handed    Social Drivers of Health   Financial Resource Strain: Low Risk  (08/21/2023)   Overall Financial Resource Strain (CARDIA)    Difficulty of Paying Living Expenses: Not hard at all  Food Insecurity: No Food Insecurity (08/21/2023)   Hunger Vital Sign    Worried About Running Out of Food in the Last Year: Never true    Ran Out of Food in the Last Year: Never true  Transportation Needs: No Transportation Needs (08/21/2023)   PRAPARE - Administrator, Civil Service (Medical): No    Lack of Transportation (Non-Medical): No  Physical Activity: Insufficiently Active (08/21/2023)   Exercise Vital Sign    Days of Exercise per Week: 4 days    Minutes of Exercise per Session: 30 min  Stress: No Stress Concern Present (08/21/2023)   [employer] of Occupational Health - Occupational Stress Questionnaire    Feeling of Stress : Only a little  Social Connections: Moderately Integrated (08/21/2023)   Social Connection and Isolation Panel [NHANES]    Frequency of Communication with Friends and Family: Three times a week    Frequency of Social  Gatherings with Friends and Family: Once a week    Attends Religious Services: 1 to 4 times per year    Active Member of [employer] or Organizations: No    Attends Engineer, structural: Not on file    Marital Status: Married  Intimate Partner Violence: Unknown (10/17/2022)   Received from [employer], [facility]   HITS    Physically Hurt: Not on file    Insult or Talk Down To: Not on file    Threaten Physical Harm: Not on file    Scream or Curse: Not on file     PHYSICAL EXAM  Vitals:   12/18/23 0749  BP: (!) 152/88  Pulse: 91  Weight: 150 lb (68 kg)  Height: 5\' 2"  (1.575 m)     Body mass index is 27.44 kg/m.  Generalized: Well developed, in no acute distress  Cardiology: normal rate and rhythm, no murmur auscultated  Respiratory: clear to auscultation bilaterally    Neurological examination  Mentation: Alert oriented to time, place, history taking. Follows all commands speech and language fluent Cranial nerve II-XII: Pupils were equal round reactive to light. Extraocular movements were full, visual field were full on confrontational test. Facial sensation and strength were normal. Uvula tongue midline. Head turning and shoulder shrug  were normal and symmetric. Motor: The motor testing reveals 5 over 5 strength of all 4 extremities. Hand grip 5/5. Good symmetric motor tone is noted throughout.  Gait and station: Gait is normal.    DIAGNOSTIC DATA (LABS, IMAGING, TESTING) - I reviewed patient records, labs, notes, testing and imaging myself where available.  Lab Results  Component Value Date   WBC 5.5 07/19/2023   HGB 13.9 07/19/2023   HCT 41.9 07/19/2023   MCV 87.8 07/19/2023   PLT 256.0 07/19/2023      Component Value Date/Time   NA 140 07/19/2023 1024   K 4.0 07/19/2023 1024   CL 108 07/19/2023 1024   CO2 23 07/19/2023 1024   GLUCOSE 129 (H) 07/19/2023 1024   BUN 18 07/19/2023 1024   CREATININE 0.68 07/19/2023 1024   CREATININE 0.90 05/14/2017  1220   CALCIUM 9.4 07/19/2023 1024   PROT 6.8 07/19/2023 1024   ALBUMIN 4.6 07/19/2023 1024   ALBUMIN 4.8 11/10/2019 1324   AST 22 07/19/2023 1024   ALT 37 (H) 07/19/2023 1024   ALKPHOS 63 07/19/2023 1024   BILITOT 0.4 07/19/2023 1024   GFRNONAA >60 06/06/2018 0721   GFRAA >60 06/06/2018 0721   Lab Results  Component Value Date   CHOL 287 (H) 07/19/2023   HDL 67.60 07/19/2023   LDLCALC 165 (H) 07/19/2023   LDLDIRECT 184.0 03/21/2019   TRIG 271.0 (H) 07/19/2023   CHOLHDL 4 07/19/2023   Lab Results  Component Value Date   HGBA1C 6.7 (H) 07/19/2023   Lab Results  Component Value Date   VITAMINB12 701 05/10/2020   Lab Results  Component Value Date   TSH 0.75 07/19/2023        No data to display               No data to display           ASSESSMENT AND PLAN  53 y.o. year old female  has a past medical history of Allergy, Asthma,  Complication of anesthesia, Depression, Diabetes mellitus without complication (HCC), Family history of adverse reaction to anesthesia, Hypertension, Hypothyroidism, Pneumonia, PONV (postoperative nausea and vomiting), Shortness of breath, Sleep apnea, and Thyroid disease. here with    Intracranial hypertension - Plan: Ambulatory referral to Ophthalmology  Carpal tunnel syndrome of right wrist  Neck pain  Vision changes   Bridget Peters is doing well from a headache standpoint. Eye exam was reportedly unremarkable. I have referred her to ophthalmology for eval of right sided vision loss and concerns of dry eye. She will continue topiramate 100mg  daily. May consider weaning in the future if headaches remain stable. She was encouraged to continue wearing wrist brace and follow up regularly with ortho. We discussed consideration of PT for neck pain. She will continue close follow up with her PCP. Healthy lifestyle habits encouraged. She will return to see me in 1 year, sooner if needed. She verbalizes understanding and agreement  with this plan.    Orders Placed This Encounter  Procedures   Ambulatory referral to Ophthalmology    Referral Priority:   Routine    Referral Type:   Consultation    Referral Reason:   Specialty Services Required    Requested Specialty:   Ophthalmology    Number of Visits Requested:   1     Meds ordered this encounter  Medications   topiramate (TOPAMAX) 100 MG tablet    Sig: Take 1 tablet (100 mg total) by mouth daily.    Dispense:  90 tablet    Refill:  3    Supervising Provider:   Anson Fret [[id number]]     Shawnie Dapper, MSN, FNP-C 12/18/2023, 8:11 AM  [facility] [address], [address] [place], [place] [address] [phone]

## 2023-12-13 NOTE — Patient Instructions (Addendum)
 Below is our plan:  We will continue topiramate 100mg  at bedtime. Please schedule ophthalmology appt asap for IIH evaluation. Consider PT for neck pain. Continue wearing wrist brace and follow up regularly with ortho.   Please make sure you are staying well hydrated. I recommend 50-60 ounces daily. Well balanced diet and regular exercise encouraged. Consistent sleep schedule with 6-8 hours recommended.   Please continue follow up with care team as directed.   Follow up with me in 1 year   You may receive a survey regarding today's visit. I encourage you to leave honest feed back as I do use this information to improve patient care. Thank you for seeing me today!

## 2023-12-18 ENCOUNTER — Encounter: Payer: Self-pay | Admitting: Family Medicine

## 2023-12-18 ENCOUNTER — Ambulatory Visit: Payer: BC Managed Care – PPO | Admitting: Family Medicine

## 2023-12-18 VITALS — BP 152/88 | HR 91 | Ht 62.0 in | Wt 150.0 lb

## 2023-12-18 DIAGNOSIS — G5601 Carpal tunnel syndrome, right upper limb: Secondary | ICD-10-CM | POA: Diagnosis not present

## 2023-12-18 DIAGNOSIS — G932 Benign intracranial hypertension: Secondary | ICD-10-CM | POA: Diagnosis not present

## 2023-12-18 DIAGNOSIS — M542 Cervicalgia: Secondary | ICD-10-CM | POA: Diagnosis not present

## 2023-12-18 DIAGNOSIS — H539 Unspecified visual disturbance: Secondary | ICD-10-CM

## 2023-12-18 MED ORDER — TOPIRAMATE 100 MG PO TABS: 100.0000 mg | ORAL_TABLET | Freq: Every day | ORAL | 3 refills | Status: AC

## 2023-12-19 ENCOUNTER — Telehealth: Payer: Self-pay | Admitting: Family Medicine

## 2023-12-19 NOTE — Telephone Encounter (Signed)
 Referral sent to [facility], phone # [phone] fax # [phone].

## 2023-12-23 ENCOUNTER — Other Ambulatory Visit: Payer: Self-pay | Admitting: Family

## 2023-12-23 DIAGNOSIS — M542 Cervicalgia: Secondary | ICD-10-CM

## 2024-02-04 ENCOUNTER — Other Ambulatory Visit (HOSPITAL_COMMUNITY): Payer: Self-pay

## 2024-02-09 ENCOUNTER — Other Ambulatory Visit: Payer: Self-pay | Admitting: Family

## 2024-02-22 ENCOUNTER — Other Ambulatory Visit: Payer: Self-pay | Admitting: Family

## 2024-02-22 DIAGNOSIS — M542 Cervicalgia: Secondary | ICD-10-CM

## 2024-03-24 ENCOUNTER — Encounter: Payer: Self-pay | Admitting: Family

## 2024-03-27 ENCOUNTER — Other Ambulatory Visit: Payer: Self-pay | Admitting: Family

## 2024-03-27 DIAGNOSIS — M542 Cervicalgia: Secondary | ICD-10-CM

## 2024-04-14 ENCOUNTER — Telehealth: Admitting: Physician Assistant

## 2024-04-14 DIAGNOSIS — H5711 Ocular pain, right eye: Secondary | ICD-10-CM

## 2024-04-14 DIAGNOSIS — H5789 Other specified disorders of eye and adnexa: Secondary | ICD-10-CM

## 2024-04-14 NOTE — Progress Notes (Signed)
  Because of having sharp pain near the iris, I feel your condition warrants further evaluation and I recommend that you be seen in a face-to-face visit.It is possible you may have a more severe infection called Iritis that needs to be evaluated in person. If you are able to follow up with your Ophthalmologist (eye doctor) you could be seen there. If not, please be seen at a local Urgent Care for further evaluation.   NOTE: There will be NO CHARGE for this E-Visit   If you are having a true medical emergency, please call 911.     For an urgent face to face visit, [facility] has multiple urgent care centers for your convenience.  Click the link below for the full list of locations and hours, walk-in wait times, appointment scheduling options and driving directions:  Urgent Care - [place], [place], [place], [place], [place], [place]  [place]     Your MyChart E-visit questionnaire answers were reviewed by a board certified advanced clinical practitioner to complete your personal care plan based on your specific symptoms.    Thank you for using e-Visits.    I have spent 5 minutes in review of e-visit questionnaire, review and updating patient chart, medical decision making and response to patient.   Delon CHRISTELLA Dickinson, PA-C

## 2024-04-15 ENCOUNTER — Encounter: Payer: Self-pay | Admitting: Family

## 2024-05-09 LAB — LAB REPORT - SCANNED
Free T4: 1.08
TSH: 0.59 (ref 0.41–5.90)

## 2024-05-11 ENCOUNTER — Other Ambulatory Visit: Payer: Self-pay | Admitting: Family

## 2024-05-11 ENCOUNTER — Other Ambulatory Visit: Payer: Self-pay | Admitting: Internal Medicine

## 2024-05-11 DIAGNOSIS — E039 Hypothyroidism, unspecified: Secondary | ICD-10-CM

## 2024-05-12 ENCOUNTER — Other Ambulatory Visit: Payer: Self-pay | Admitting: Family

## 2024-05-12 DIAGNOSIS — E039 Hypothyroidism, unspecified: Secondary | ICD-10-CM

## 2024-05-13 ENCOUNTER — Telehealth: Payer: Self-pay

## 2024-05-13 ENCOUNTER — Other Ambulatory Visit: Payer: Self-pay

## 2024-05-13 DIAGNOSIS — E039 Hypothyroidism, unspecified: Secondary | ICD-10-CM

## 2024-05-13 NOTE — Telephone Encounter (Signed)
 Copied from CRM [id number]. Topic: General - Other >> May 13, 2024 11:36 AM Revonda D wrote: Reason for CRM: Pt is returning a missed call from the office. I informed the pt that she needs to schedule a TSH appt. Pt requested to add the Tsh ppt to her appt on 9/19 with pcp

## 2024-05-13 NOTE — Telephone Encounter (Signed)
 Called Patient to schedule a TSH due to her synthroid  medication brand being changed.

## 2024-05-13 NOTE — Telephone Encounter (Signed)
 Noted Tsh lab ordered

## 2024-05-14 NOTE — Telephone Encounter (Signed)
 Called PT to confirm medication pick up  for Levothyroxine  (SYNTHROID ) 75 MCG tablet PT stated she will pick that up sometime today.

## 2024-06-09 ENCOUNTER — Other Ambulatory Visit: Payer: Self-pay | Admitting: Internal Medicine

## 2024-06-09 DIAGNOSIS — J4540 Moderate persistent asthma, uncomplicated: Secondary | ICD-10-CM

## 2024-06-10 ENCOUNTER — Other Ambulatory Visit: Payer: Self-pay

## 2024-06-23 ENCOUNTER — Other Ambulatory Visit: Payer: Self-pay | Admitting: Family

## 2024-06-23 DIAGNOSIS — M542 Cervicalgia: Secondary | ICD-10-CM

## 2024-06-27 ENCOUNTER — Ambulatory Visit: Admitting: Family

## 2024-06-30 ENCOUNTER — Ambulatory Visit (INDEPENDENT_AMBULATORY_CARE_PROVIDER_SITE_OTHER): Admitting: Family

## 2024-06-30 ENCOUNTER — Encounter: Payer: Self-pay | Admitting: Family

## 2024-06-30 VITALS — BP 120/76 | HR 86 | Temp 97.4°F | Ht 62.0 in | Wt 150.2 lb

## 2024-06-30 DIAGNOSIS — I700 Atherosclerosis of aorta: Secondary | ICD-10-CM

## 2024-06-30 DIAGNOSIS — I10 Essential (primary) hypertension: Secondary | ICD-10-CM

## 2024-06-30 DIAGNOSIS — Z23 Encounter for immunization: Secondary | ICD-10-CM | POA: Diagnosis not present

## 2024-06-30 DIAGNOSIS — E039 Hypothyroidism, unspecified: Secondary | ICD-10-CM

## 2024-06-30 DIAGNOSIS — Z79890 Hormone replacement therapy (postmenopausal): Secondary | ICD-10-CM | POA: Diagnosis not present

## 2024-06-30 DIAGNOSIS — D582 Other hemoglobinopathies: Secondary | ICD-10-CM

## 2024-06-30 DIAGNOSIS — M542 Cervicalgia: Secondary | ICD-10-CM | POA: Diagnosis not present

## 2024-06-30 DIAGNOSIS — E7800 Pure hypercholesterolemia, unspecified: Secondary | ICD-10-CM

## 2024-06-30 LAB — COMPREHENSIVE METABOLIC PANEL WITH GFR
ALT: 20 U/L (ref 0–35)
AST: 16 U/L (ref 0–37)
Albumin: 4.5 g/dL (ref 3.5–5.2)
Alkaline Phosphatase: 73 U/L (ref 39–117)
BUN: 10 mg/dL (ref 6–23)
CO2: 26 meq/L (ref 19–32)
Calcium: 9.4 mg/dL (ref 8.4–10.5)
Chloride: 108 meq/L (ref 96–112)
Creatinine, Ser: 0.73 mg/dL (ref 0.40–1.20)
GFR: 93.87 mL/min (ref >60.00)
Glucose, Bld: 172 mg/dL — ABNORMAL HIGH (ref 70–99)
Potassium: 4.6 meq/L (ref 3.5–5.1)
Sodium: 140 meq/L (ref 135–145)
Total Bilirubin: 0.5 mg/dL (ref 0.2–1.2)
Total Protein: 6.7 g/dL (ref 6.0–8.3)

## 2024-06-30 LAB — TSH: TSH: 0.74 u[IU]/mL (ref 0.35–5.50)

## 2024-06-30 LAB — VITAMIN D 25 HYDROXY (VIT D DEFICIENCY, FRACTURES): VITD: 27.29 ng/mL — ABNORMAL LOW (ref 30.00–100.00)

## 2024-06-30 LAB — LIPID PANEL
Cholesterol: 242 mg/dL — ABNORMAL HIGH (ref 0–200)
HDL: 71.4 mg/dL (ref >39.00)
LDL Cholesterol: 129 mg/dL — ABNORMAL HIGH (ref 0–99)
NonHDL: 170.94
Total CHOL/HDL Ratio: 3
Triglycerides: 212 mg/dL — ABNORMAL HIGH (ref 0.0–149.0)
VLDL: 42.4 mg/dL — ABNORMAL HIGH (ref 0.0–40.0)

## 2024-06-30 LAB — HEMOGLOBIN A1C: Hgb A1c MFr Bld: 6.8 % — ABNORMAL HIGH (ref 4.6–6.5)

## 2024-06-30 NOTE — Progress Notes (Unsigned)
 Assessment & Plan:  Need for diphtheria, tetanus, acellular pertussis and haemophilus influenzae vaccine  Hypothyroidism Assessment & Plan: Chronic, stable.  Continue Synthroid  75 mcg.  Orders: -     Lipid panel -     Hemoglobin A1c -     Comprehensive metabolic panel with GFR -     VITAMIN D  25 Hydroxy (Vit-D Deficiency, Fractures)  Need for influenza vaccination -     Flu vaccine, recombinant, trivalent, inj  Hypothyroidism, unspecified type Assessment & Plan: Chronic, stable.  Continue Synthroid  75 mcg.  Orders: -     TSH  Hypercholesteremia  Hormone replacement therapy (HRT)  Neck pain Assessment & Plan: Chronic, stable. continue gabapentin  100 mg at bedtime and Flexeril  as needed. Consider increasing gabapentin  to 200 mg if necessary.   Aortic atherosclerosis Assessment & Plan: Chronic, symptomatically stable.  Pending lipid panel.  Discussed CT calcium  score and encouraged patient to consider this test.  Information provided on after visit summary.      Return precautions given.   Risks, benefits, and alternatives of the medications and treatment plan prescribed today were discussed, and patient expressed understanding.   Education regarding symptom management and diagnosis given to patient on AVS either electronically or printed.  No follow-ups on file.  Rollene Northern, FNP  Subjective:    Patient ID: Bridget Peters, female    DOB: 02/18/1971, 53 y.o.   MRN: [id number]  CC: Bridget Peters is a 53 y.o. female who presents today for follow up.   HPI: HPI Discussed the use of AI scribe software for clinical note transcription with the patient, who gave verbal consent to proceed.  History of Present Illness   Bridget Peters is a 53 year old female who presents for medication follow up .  Her thyroid  function was last evaluated in August through Bridget Peters, showing a TSH level of 0.59, slightly decreased from 0.75 eleven months prior. She  has been on 75 mcg of Synthroid  since 2016. Denies cold or heat intolerance, unintentional weight loss, diarrhea, or increased anxiety.  She has not had a period since 2016, and her FSH levels have been elevated as obtained by [facility]. Denies pelvic discomfort or vaginal bleeding.    She is on hormone replacement therapy, including progesterone 100 mg orally and a topical estrogen-testosterone compound, which has improved her sleep.   She has a history of asthma, managed with Nucala  and a nebulizer.symptoms are well-controlled at this time.    She takes gabapentin  100 mg at bedtime for hip pain and uses Flexeril  as needed for neck pain, finding it effective.   Her LDL cholesterol was high at the last check, and she has a family history of heart disease, with her maternal grandfather having had a heart attack and open-heart surgery in his late sixties. She is actively trying to lose weight and is concerned about her cholesterol levels.             Allergies: Shellfish allergy, Iodinated contrast media, Milk-related compounds, Tamiflu  [oseltamivir  phosphate], Wheat, Covid-19 (adenovirus) vaccine, Acetazolamide , and Xolair  [omalizumab ] Current Outpatient Medications on File Prior to Visit  Medication Sig Dispense Refill   albuterol  (PROVENTIL ) (2.5 MG/3ML) 0.083% nebulizer solution INHALE 3 ML BY NEBULIZATION EVERY 6 HOURS AS NEEDED FOR WHEEZING OR SHORTNESS OF BREATH 75 mL 12   amLODipine  (NORVASC ) 2.5 MG tablet TAKE 1 TABLET BY MOUTH EVERY DAY 90 tablet 0   buPROPion  (WELLBUTRIN  XL) 150 MG 24 hr tablet TAKE 1 TABLET BY  MOUTH EVERY DAY 90 tablet 3   cetirizine  (ZYRTEC ) 10 MG tablet TAKE 1 TABLET BY MOUTH EVERY DAY 30 tablet 2   cyclobenzaprine  (FLEXERIL ) 10 MG tablet TAKE 1/2 TO 1 TABLET (5-10 MG TOTAL) BY MOUTH EVERY DAY AT BEDTIME AS NEEDED FOR MUSCLE SPASM 30 tablet 2   EPIPEN  2-PAK 0.3 MG/0.3ML SOAJ injection   11   ESTROGENS CONJ SYNTHETIC A PO Take 1 Dose by mouth daily.      fexofenadine  (ALLEGRA ) 180 MG tablet TAKE 1 TABLET BY MOUTH EVERY DAY 30 tablet 2   gabapentin  (NEURONTIN ) 100 MG capsule TAKE 1 CAPSULE BY MOUTH EVERYDAY AT BEDTIME 90 capsule 3   levothyroxine  (SYNTHROID ) 75 MCG tablet TAKE 1 TABLET BY MOUTH EVERY DAY 90 tablet 1   montelukast  (SINGULAIR ) 10 MG tablet TAKE 1 TABLET BY MOUTH EVERY DAY 90 tablet 1   NUCALA  100 MG/ML SOSY INJECT 100MG  SUBCUTANEOUSLY  EVERY 4 WEEKS 1 mL 7   progesterone (PROMETRIUM) 100 MG capsule Take 100 mg by mouth daily.     topiramate  (TOPAMAX ) 100 MG tablet Take 1 tablet (100 mg total) by mouth daily. 90 tablet 3   No current facility-administered medications on file prior to visit.    Review of Systems  Constitutional:  Negative for chills and fever.  Respiratory:  Negative for cough.   Cardiovascular:  Negative for chest pain and palpitations.  Gastrointestinal:  Negative for nausea and vomiting.      Objective:    BP 120/76   Pulse 86   Temp (!) 97.4 F (36.3 C) (Oral)   Ht 5' 2 (1.575 m)   Wt 150 lb 3.2 oz (68.1 kg)   SpO2 99%   BMI 27.47 kg/m  BP Readings from Last 3 Encounters:  06/30/24 120/76  12/18/23 (!) 152/88  09/19/23 136/89   Wt Readings from Last 3 Encounters:  06/30/24 150 lb 3.2 oz (68.1 kg)  12/18/23 150 lb (68 kg)  09/19/23 157 lb (71.2 kg)    Physical Exam Vitals reviewed.  Constitutional:      Appearance: She is well-developed.  Eyes:     Conjunctiva/sclera: Conjunctivae normal.  Cardiovascular:     Rate and Rhythm: Normal rate and regular rhythm.     Pulses: Normal pulses.     Heart sounds: Normal heart sounds.  Pulmonary:     Effort: Pulmonary effort is normal.     Breath sounds: Normal breath sounds. No wheezing, rhonchi or rales.  Skin:    General: Skin is warm and dry.  Neurological:     Mental Status: She is alert.  Psychiatric:        Speech: Speech normal.        Behavior: Behavior normal.        Thought Content: Thought content normal.

## 2024-06-30 NOTE — Patient Instructions (Signed)
  If you would like to further stratify your cardiovascular risk or consider medication management, I would first recommend obtaining a CT calcium score.  Information included below.  Please let me know if you would like to order and I will place.    Once I have ordered, you may schedule on your own by calling [phone] ( [facility] [address] in [place] ).   An estimate of cost is $150-200 out-of-pocket as not covered by insurance.    Below an article from [facility] regarding the test.   [email or web address]://[email or web address].org/imaging/exams-and-procedures/screenings/cardiac-ct#:~:text=A%20cardiac%20CT%20calcium%20score,arteries%20can%20cause%20heart%20attacks.   Exams We Offer: Cardiac CT Calcium Score  Knowing your score could save your life. A cardiac CT calcium score, also known as a coronary calcium scan, is a quick, convenient and noninvasive way of evaluating the amount of calcified (hard) plaque in your heart vessels. The level of calcium equates to the extent of plaque build-up in your arteries. Plaque in the arteries can cause heart attacks.  The radiologist reads the images and sends your doctor a report with a calcium score. Patients with higher scores have a greater risk for a heart attack, heart disease or stroke. Knowing your score can help your doctor decide on blood pressure and cholesterol goals that will minimize your risk as much as possible.  The [employer] of [employer] found that Coronary artery calcification (CAC) is an excellent cardiovascular disease risk marker and can help guide the decision to use cholesterol reducing medications such as statins. A negative calcium score may reduce the need for statins in otherwise eligible patients.  The exam takes less than 10 minutes, is painless and does not require any IV or oral contrast.  Who should get a Cardiac CT Calcium Score: Middle age adults at intermediate risk of heart disease Family history of  heart disease Borderline high cholesterol, high blood pressure or diabetes Overweight or physical inactivity Uncertain about taking daily preventive medical therapy

## 2024-07-01 ENCOUNTER — Encounter: Payer: Self-pay | Admitting: Family

## 2024-07-01 DIAGNOSIS — E039 Hypothyroidism, unspecified: Secondary | ICD-10-CM

## 2024-07-02 ENCOUNTER — Encounter: Payer: Self-pay | Admitting: Family

## 2024-07-02 NOTE — Assessment & Plan Note (Signed)
Chronic, stable.  Continue Synthroid 75 mcg

## 2024-07-02 NOTE — Assessment & Plan Note (Signed)
 Chronic, symptomatically stable.  Pending lipid panel.  Discussed CT calcium  score and encouraged patient to consider this test.  Information provided on after visit summary.

## 2024-07-02 NOTE — Assessment & Plan Note (Signed)
Chronic, stable. Continue amlodipine 2.5mg daily.  

## 2024-07-02 NOTE — Assessment & Plan Note (Signed)
 Chronic, stable. continue gabapentin  100 mg at bedtime and Flexeril  as needed. Consider increasing gabapentin  to 200 mg if necessary.

## 2024-07-03 ENCOUNTER — Other Ambulatory Visit: Payer: Self-pay | Admitting: Family

## 2024-07-03 ENCOUNTER — Ambulatory Visit: Payer: Self-pay | Admitting: Family

## 2024-07-03 MED ORDER — ATORVASTATIN CALCIUM 10 MG PO TABS: 10.0000 mg | ORAL_TABLET | Freq: Every day | ORAL | 1 refills | Status: DC

## 2024-07-03 NOTE — Telephone Encounter (Signed)
 Appt made. Labs and meds sent

## 2024-07-08 ENCOUNTER — Encounter: Payer: Self-pay | Admitting: Family

## 2024-07-08 NOTE — Telephone Encounter (Signed)
 LM for patient

## 2024-07-09 NOTE — Telephone Encounter (Signed)
Lvm, mychart message sent

## 2024-08-09 ENCOUNTER — Other Ambulatory Visit: Payer: Self-pay | Admitting: Family

## 2024-08-14 ENCOUNTER — Encounter: Payer: Self-pay | Admitting: Family

## 2024-08-14 ENCOUNTER — Encounter: Payer: Self-pay | Admitting: Internal Medicine

## 2024-08-14 ENCOUNTER — Other Ambulatory Visit: Payer: Self-pay

## 2024-08-14 DIAGNOSIS — E039 Hypothyroidism, unspecified: Secondary | ICD-10-CM

## 2024-08-14 DIAGNOSIS — M542 Cervicalgia: Secondary | ICD-10-CM

## 2024-08-14 DIAGNOSIS — J069 Acute upper respiratory infection, unspecified: Secondary | ICD-10-CM

## 2024-08-14 MED ORDER — AMLODIPINE BESYLATE 2.5 MG PO TABS: 2.5000 mg | ORAL_TABLET | Freq: Every day | ORAL | 0 refills | Status: DC

## 2024-08-14 MED ORDER — BUPROPION HCL ER (XL) 150 MG PO TB24: 150.0000 mg | ORAL_TABLET | Freq: Every day | ORAL | 3 refills | Status: DC

## 2024-08-14 MED ORDER — LEVOTHYROXINE SODIUM 75 MCG PO TABS: 75.0000 ug | ORAL_TABLET | Freq: Every day | ORAL | 1 refills | Status: DC

## 2024-08-14 MED ORDER — MONTELUKAST SODIUM 10 MG PO TABS: 10.0000 mg | ORAL_TABLET | Freq: Every day | ORAL | 3 refills | Status: AC

## 2024-08-14 MED ORDER — ALBUTEROL SULFATE HFA 108 (90 BASE) MCG/ACT IN AERS: 2.0000 | INHALATION_SPRAY | Freq: Four times a day (QID) | RESPIRATORY_TRACT | 3 refills | Status: AC | PRN

## 2024-08-14 MED ORDER — GABAPENTIN 100 MG PO CAPS: ORAL_CAPSULE | ORAL | 3 refills | Status: AC

## 2024-08-18 ENCOUNTER — Encounter: Payer: Self-pay | Admitting: Internal Medicine

## 2024-08-18 ENCOUNTER — Other Ambulatory Visit: Payer: Self-pay

## 2024-08-18 ENCOUNTER — Ambulatory Visit: Payer: Self-pay | Admitting: Family

## 2024-08-18 ENCOUNTER — Other Ambulatory Visit (INDEPENDENT_AMBULATORY_CARE_PROVIDER_SITE_OTHER)

## 2024-08-18 DIAGNOSIS — E039 Hypothyroidism, unspecified: Secondary | ICD-10-CM

## 2024-08-18 LAB — COMPREHENSIVE METABOLIC PANEL WITH GFR
ALT: 32 U/L (ref 0–35)
AST: 22 U/L (ref 0–37)
Albumin: 4.7 g/dL (ref 3.5–5.2)
Alkaline Phosphatase: 82 U/L (ref 39–117)
BUN: 13 mg/dL (ref 6–23)
CO2: 21 meq/L (ref 19–32)
Calcium: 9.1 mg/dL (ref 8.4–10.5)
Chloride: 108 meq/L (ref 96–112)
Creatinine, Ser: 0.74 mg/dL (ref 0.40–1.20)
GFR: 92.26 mL/min (ref >60.00)
Glucose, Bld: 129 mg/dL — ABNORMAL HIGH (ref 70–99)
Potassium: 3.7 meq/L (ref 3.5–5.1)
Sodium: 139 meq/L (ref 135–145)
Total Bilirubin: 0.5 mg/dL (ref 0.2–1.2)
Total Protein: 7.3 g/dL (ref 6.0–8.3)

## 2024-08-18 LAB — LIPID PANEL
Cholesterol: 194 mg/dL (ref 0–200)
HDL: 74.9 mg/dL (ref >39.00)
LDL Cholesterol: 96 mg/dL (ref 0–99)
NonHDL: 119.24
Total CHOL/HDL Ratio: 3
Triglycerides: 115 mg/dL (ref 0.0–149.0)
VLDL: 23 mg/dL (ref 0.0–40.0)

## 2024-08-18 MED ORDER — LEVOTHYROXINE SODIUM 75 MCG PO TABS: 75.0000 ug | ORAL_TABLET | Freq: Every day | ORAL | 1 refills | Status: DC

## 2024-08-18 MED ORDER — BUPROPION HCL ER (XL) 150 MG PO TB24: 150.0000 mg | ORAL_TABLET | Freq: Every day | ORAL | 3 refills | Status: AC

## 2024-08-18 MED ORDER — AMLODIPINE BESYLATE 2.5 MG PO TABS: 2.5000 mg | ORAL_TABLET | Freq: Every day | ORAL | 3 refills | Status: AC

## 2024-08-27 NOTE — Telephone Encounter (Signed)
 open in error

## 2024-09-11 ENCOUNTER — Telehealth: Admitting: Physician Assistant

## 2024-09-11 DIAGNOSIS — J069 Acute upper respiratory infection, unspecified: Secondary | ICD-10-CM

## 2024-09-11 DIAGNOSIS — Z20828 Contact with and (suspected) exposure to other viral communicable diseases: Secondary | ICD-10-CM

## 2024-09-11 MED ORDER — XOFLUZA (40 MG DOSE) 2 X 20 MG PO TBPK: 40.0000 mg | ORAL_TABLET | Freq: Once | ORAL | 0 refills | Status: AC

## 2024-09-11 MED ORDER — PROMETHAZINE-DM 6.25-15 MG/5ML PO SYRP: 5.0000 mL | ORAL_SOLUTION | Freq: Four times a day (QID) | ORAL | 0 refills | Status: AC | PRN

## 2024-09-11 NOTE — Progress Notes (Signed)
 Message sent to patient requesting further input regarding current symptoms. Awaiting patient response.

## 2024-09-11 NOTE — Progress Notes (Signed)
 E visit for Flu like symptoms   We are sorry that you are not feeling well.  Here is how we plan to help! Based on what you have shared with me it looks like you may have a respiratory virus that may be influenza.  Influenza or "the flu" is  an infection caused by a respiratory virus. The flu virus is highly contagious and persons who did not receive their yearly flu vaccination may "catch" the flu from close contact.  We have anti-viral medications to treat the viruses that cause this infection. They are not a "cure" and only shorten the course of the infection. These prescriptions are most effective when they are given within the first 2 days of "flu" symptoms. Antiviral medications are indicated if you have a high risk of complications from the flu. You should  also consider an antiviral medication if you are in close contact with someone who is at risk. These medications can help patients avoid complications from the flu but have side effects that you should know.   Possible side effects from Tamiflu  or oseltamivir  include nausea, vomiting, diarrhea, dizziness, headaches, eye redness, sleep problems or other respiratory symptoms. You should not take Tamiflu  if you have an allergy to oseltamivir  or any to the ingredients in Tamiflu .  Based upon your symptoms and potential risk factors I have prescribed Xofluza  (Baloxavir) 20mg  tabs (2 tablets) once by mouth (if you a weigh between  88 pounds and 176 pounds- total of 40mg ).   For nasal congestion, you may use an oral decongestant such as Mucinex  D or if you have glaucoma or high blood pressure use plain Mucinex .  Saline nasal spray or nasal drops can help and can safely be used as often as needed for congestion.  If you have a sore or scratchy throat, use a saltwater gargle-  to  teaspoon of salt dissolved in a 4-ounce to 8-ounce glass of warm water .  Gargle the solution for approximately 15-30 seconds and then spit.  It is important not to  swallow the solution.  You can also use throat lozenges/cough drops and Chloraseptic spray to help with throat pain or discomfort.  Warm or cold liquids can also be helpful in relieving throat pain.  For headache, pain or general discomfort, you can use Ibuprofen or Tylenol  as directed.   Some authorities believe that zinc sprays or the use of Echinacea may shorten the course of your symptoms.  I have prescribed the following medications to help lessen symptoms: I have prescribed Phenergan DM 6.25 mg/15 mg. You make take one teaspoon / 5 ml every 4-6 hours as needed for cough  You are to isolate at home until you have been fever-free for at least 24 hours without a fever-reducing medication, and symptoms have been steadily improving for 24 hours.  If you must be around other household members who do not have symptoms, you need to make sure that both you and the family members are masking consistently with a high-quality mask.  If you note any worsening of symptoms despite treatment, please seek an in-person evaluation ASAP. If you note any significant shortness of breath or any chest pain, please seek ED evaluation. Please do not delay care!  ANYONE WHO HAS FLU SYMPTOMS SHOULD: Stay home. The flu is highly contagious and going out or to work exposes others! Be sure to drink plenty of fluids. Water  is fine as well as fruit juices, sodas and electrolyte beverages. You may want to stay away  from caffeine  or alcohol. If you are nauseated, try taking small sips of liquids. How do you know if you are getting enough fluid? Your urine should be a pale yellow or almost colorless. Get rest. Taking a steamy shower or using a humidifier may help nasal congestion and ease sore throat pain. Using a saline nasal spray works much the same way. Cough drops, hard candies and sore throat lozenges may ease your cough. Line up a caregiver. Have someone check on you regularly.  GET HELP RIGHT AWAY IF: You cannot keep  down liquids or your medications. You become short of breath Your fell like you are going to pass out or loose consciousness. Your symptoms persist after you have completed your treatment plan  MAKE SURE YOU  Understand these instructions. Will watch your condition. Will get help right away if you are not doing well or get worse.  Your e-visit answers were reviewed by a board certified advanced clinical practitioner to complete your personal care plan.  Depending on the condition, your plan could have included both over the counter or prescription medications.  If there is a problem please reply  once you have received a response from your provider.  Your safety is important to us .  If you have drug allergies check your prescription carefully.    You can use MyChart to ask questions about today's visit, request a non-urgent call back, or ask for a work or school excuse for 24 hours related to this e-Visit. If it has been greater than 24 hours you will need to follow up with your provider, or enter a new e-Visit to address those concerns.  You will get an e-mail in the next two days asking about your experience.  I hope that your e-visit has been valuable and will speed your recovery. Thank you for using e-visits.   I have spent 5 minutes in review of e-visit questionnaire, review and updating patient chart, medical decision making and response to patient.   Elsie Velma Lunger, PA-C

## 2024-09-26 ENCOUNTER — Encounter: Payer: Self-pay | Admitting: Internal Medicine

## 2024-09-26 ENCOUNTER — Other Ambulatory Visit: Payer: Self-pay | Admitting: Family

## 2024-10-06 ENCOUNTER — Other Ambulatory Visit: Payer: Self-pay | Admitting: Family

## 2024-10-06 DIAGNOSIS — M542 Cervicalgia: Secondary | ICD-10-CM

## 2024-10-06 MED ORDER — CYCLOBENZAPRINE HCL 10 MG PO TABS: ORAL_TABLET | ORAL | 2 refills | Status: AC

## 2024-10-16 ENCOUNTER — Other Ambulatory Visit (HOSPITAL_COMMUNITY): Payer: Self-pay

## 2024-10-20 ENCOUNTER — Telehealth: Payer: Self-pay

## 2024-10-20 ENCOUNTER — Other Ambulatory Visit (HOSPITAL_COMMUNITY): Payer: Self-pay

## 2024-10-20 NOTE — Telephone Encounter (Signed)
 Received notification via patient message that pt has new insurance and requires pa for Nucala . Submitted a Prior Authorization request to [employer] for NUCALA  via CoverMyMeds. Will update once we receive a response.  Key: [id number]

## 2024-10-20 NOTE — Telephone Encounter (Signed)
 Copied from CRM #[id number]. Topic: Clinical - Medication Prior Auth >> Oct 20, 2024  8:29 AM Leotis ORN wrote: Reason for CRM: caller: [facility] (patient's new specialty pharmacy) Callback Number: [phone]  Nanetta called on behalf of the patient regarding NUCALA  100 mg/mL SOSY. She stated the patient is a new member with their pharmacy and the medication will require a prior authorization ( she stated she had sent a request back in 08/2024)  -Patient is due for the next dose Sunday the 25th. -Pharmacy advised the PA is needed by mid-week to avoid a delay. [facility] stated they refaxed the PA request today. -Patient has also sent a MyChart message regarding this same issue. Call was made to help ensure the request and messages are received and addressed timely.

## 2024-10-24 NOTE — Telephone Encounter (Signed)
 Received a fax regarding Prior Authorization from [employer] for NUCALA . Authorization has been DENIED because updated office notes (must be from within the past 6 months) and eosinophil counts from before starting Nucala  are required.  Phone# [phone]  Pt has office visit on 1/20, will reattempt pa after visit.

## 2024-10-28 ENCOUNTER — Encounter: Payer: Self-pay | Admitting: Internal Medicine

## 2024-10-28 ENCOUNTER — Ambulatory Visit: Payer: Self-pay | Admitting: Internal Medicine

## 2024-10-28 VITALS — BP 150/90 | HR 96 | Temp 98.1°F | Ht 62.0 in | Wt 148.6 lb

## 2024-10-28 DIAGNOSIS — Z8709 Personal history of other diseases of the respiratory system: Secondary | ICD-10-CM

## 2024-10-28 DIAGNOSIS — J4550 Severe persistent asthma, uncomplicated: Secondary | ICD-10-CM

## 2024-10-28 DIAGNOSIS — J4552 Severe persistent asthma with status asthmaticus: Secondary | ICD-10-CM

## 2024-10-28 LAB — NITRIC OXIDE: Nitric Oxide: 123

## 2024-10-28 NOTE — Progress Notes (Unsigned)
 "  @Patient  ID: Bridget Peters, female    DOB: Jun 30, 1971, 54 y.o.   MRN: [id number]   SYNOPSIS 54 year old female, never smoked. PMH significant for moderate persistent asthma, OSA, sinusitis, HTN, aortic atherosclerosis, type 2 diabetes, hypothyroidism.  ASTHMA Maintained on Spiriva  respimat 1.60mcg, Singualir, Allergra, Flonase  and prn ventolin . NUCALA  INJECTIONS   CC Follow-up severe persistent asthma  HPI Patient with a diagnosis of severe persistent asthma  Receiving Nucala  injections through allergy immunology All types inhaler therapy have not worked Patient did not tolerate Symbicort Uses albuterol  as needed  Patient with previous history of sinus infections Previous sinus surgery for maxillary cyst No follow-up ENT necessary at this time  Nucala  injections are preventing asthma exacerbation No exacerbation at this time No evidence of heart failure at this time No evidence or signs of infection at this time No respiratory distress No fevers, chills, nausea, vomiting, diarrhea No evidence of lower extremity edema No evidence hemoptysis    Allergies  Allergen Reactions   Shellfish Allergy Hives and Shortness Of Breath   Iodinated Contrast Media Hives    Pt developed hives approximately 6 hours after receiving Isovue  300 for CT Scan. She was given 50mG  oral Benadryl  and symptoms improved   Milk-Related Compounds Hives   Tamiflu  [Oseltamivir  Phosphate] Hives   Wheat Hives   Covid-19 (Adenovirus) Vaccine     Pfizer   Acetazolamide  Rash   Xolair  [Omalizumab ] Itching    Immunization History  Administered Date(s) Administered   Influenza Split 07/03/2016   Influenza, Mdck, Trivalent,PF 6+ MOS(egg free) 09/14/2023   Influenza, Seasonal, Injecte, Preservative Fre 08/05/2015   Influenza,inj,Quad PF,6+ Mos 07/26/2020, 07/06/2021, 07/31/2022   Influenza,trivalent, recombinat, inj, PF 06/30/2024   PFIZER(Purple Top)SARS-COV-2 Vaccination 10/13/2019   Pneumococcal  Conjugate-13 04/24/2017   Pneumococcal Polysaccharide-23 02/13/2018   Pneumococcal-Unspecified 02/12/2012   Tdap 10/11/2009, 11/16/2021    Past Medical History:  Diagnosis Date   Allergy    Seasonal   Asthma    Complication of anesthesia    nausea and vomitting   Depression    Diabetes mellitus without complication (HCC)    Family history of adverse reaction to anesthesia    nausea and vomitting   Hypertension    Hypothyroidism    Neuromuscular disorder (HCC)    Pneumonia    PONV (postoperative nausea and vomiting)    Shortness of breath    Sleep apnea    Thyroid  disease     Tobacco History: Social History   Tobacco Use  Smoking Status Never  Smokeless Tobacco Never   Counseling given: Not Answered   Outpatient Medications Prior to Visit  Medication Sig Dispense Refill   albuterol  (PROVENTIL ) (2.5 MG/3ML) 0.083% nebulizer solution INHALE 3 ML BY NEBULIZATION EVERY 6 HOURS AS NEEDED FOR WHEEZING OR SHORTNESS OF BREATH 75 mL 12   albuterol  (VENTOLIN  HFA) 108 (90 Base) MCG/ACT inhaler Inhale 2 puffs into the lungs every 6 (six) hours as needed for wheezing or shortness of breath. 3 each 3   amLODipine  (NORVASC ) 2.5 MG tablet Take 1 tablet (2.5 mg total) by mouth daily. 90 tablet 3   atorvastatin  (LIPITOR) 10 MG tablet TAKE 1 TABLET BY MOUTH DAILY 90 tablet 3   buPROPion  (WELLBUTRIN  XL) 150 MG 24 hr tablet Take 1 tablet (150 mg total) by mouth daily. 90 tablet 3   cetirizine  (ZYRTEC ) 10 MG tablet TAKE 1 TABLET BY MOUTH EVERY DAY 30 tablet 2   cyclobenzaprine  (FLEXERIL ) 10 MG tablet TAKE 1/2 TO 1  TABLET (5-10 MG TOTAL) BY MOUTH EVERY DAY AT BEDTIME AS NEEDED FOR MUSCLE SPASM 30 tablet 2   EPIPEN  2-PAK 0.3 MG/0.3ML SOAJ injection   11   ESTROGENS CONJ SYNTHETIC A PO Take 1 Dose by mouth daily.     fexofenadine  (ALLEGRA ) 180 MG tablet TAKE 1 TABLET BY MOUTH EVERY DAY 30 tablet 2   gabapentin  (NEURONTIN ) 100 MG capsule TAKE 1 CAPSULE BY MOUTH EVERYDAY AT BEDTIME 90 capsule 3    levothyroxine  (SYNTHROID ) 75 MCG tablet Take 1 tablet (75 mcg total) by mouth daily. 90 tablet 1   montelukast  (SINGULAIR ) 10 MG tablet Take 1 tablet (10 mg total) by mouth daily. 90 tablet 3   NUCALA  100 MG/ML SOSY INJECT 100MG  SUBCUTANEOUSLY  EVERY 4 WEEKS 1 mL 7   progesterone (PROMETRIUM) 100 MG capsule Take 100 mg by mouth daily.     promethazine -dextromethorphan (PROMETHAZINE -DM) 6.25-15 MG/5ML syrup Take 5 mLs by mouth 4 (four) times daily as needed for cough. 118 mL 0   topiramate  (TOPAMAX ) 100 MG tablet Take 1 tablet (100 mg total) by mouth daily. 90 tablet 3   No facility-administered medications prior to visit.      CBC    Component Value Date/Time   WBC 5.5 07/19/2023 1024   RBC 4.78 07/19/2023 1024   HGB 13.9 07/19/2023 1024   HCT 41.9 07/19/2023 1024   PLT 256.0 07/19/2023 1024   MCV 87.8 07/19/2023 1024   MCH 29.4 03/25/2020 0837   MCHC 33.2 07/19/2023 1024   RDW 12.9 07/19/2023 1024   LYMPHSABS 1.9 07/19/2023 1024   MONOABS 0.3 07/19/2023 1024   EOSABS 0.0 07/19/2023 1024   BASOSABS 0.0 07/19/2023 1024    BMET    Component Value Date/Time   NA 139 08/18/2024 0936   K 3.7 08/18/2024 0936   CL 108 08/18/2024 0936   CO2 21 08/18/2024 0936   GLUCOSE 129 (H) 08/18/2024 0936   BUN 13 08/18/2024 0936   CREATININE 0.74 08/18/2024 0936   CREATININE 0.90 05/14/2017 1220   CALCIUM  9.1 08/18/2024 0936   GFRNONAA >60 06/06/2018 0721   GFRAA >60 06/06/2018 0721   BP (!) 150/90 Comment: has been talking about stressful topics  Pulse 96   Temp 98.1 F (36.7 C)   Ht 5' 2 (1.575 m)   Wt 148 lb 9.6 oz (67.4 kg)   SpO2 97%   BMI 27.18 kg/m      Physical Examination:  General Appearance: No distress  EYES EOM intact.   NECK Supple, No JVD Pulmonary: normal breath sounds, No wheezing.  CardiovascularNormal S1,S2.  No m/r/g.   Ext pulses intact, cap refill intact  ALL OTHER ROS ARE NEGATIVE          Assessment & Plan:   54 year old pleasant  white female seen today for follow-up assessment for severe persistent asthma on immunotherapy with Nucala    Assessment of ASTHMA   Lab Results  Component Value Date   NITRICOXIDE 123 10/28/2024   Elevated exhaled Nitric oxide  testing is highly consistent with type II inflammation  Severe persistent asthma Continue Nucala  home injections every 4 weeks  Patient benefits from this to avoid hospitalizations and asthma exacerbations  Continue albuterol  as needed  Continue Singulair  Flonase  Allegra  Zyrtec  as directed   Patient with recent influenza infection Did not require hospitalization Symptoms improved  Patient needs Nucala  injections to prevent decompensation and respiratory failure  Avoid Allergens and Irritants Avoid secondhand smoke Avoid SICK contacts Recommend  Masking  when  appropriate Recommend Keep up-to-date with vaccinations    Patient  satisfied with Plan of action and management. All questions answered   Follow up 6 months   I spent a total of 45 minutes dedicated to the care of this patient on the date of this encounter to include pre-visit review of records, face-to-face time with the patient discussing conditions above, post visit ordering of testing, clinical documentation with the electronic health record, making appropriate referrals as documented, and communicating necessary information to the patient's healthcare team.    The Patient requires high complexity decision making for assessment and support, frequent evaluation and titration of therapies, application of advanced monitoring technologies and extensive interpretation of multiple databases.  Patient satisfied with Plan of action and management. All questions answered    Nickolas Alm Cellar, M.D.  [facility]  Medical Director [facility]      "

## 2024-10-29 ENCOUNTER — Other Ambulatory Visit (HOSPITAL_COMMUNITY): Payer: Self-pay

## 2024-10-29 NOTE — Telephone Encounter (Signed)
 Submitted a Prior Authorization request to [employer] for NUCALA  via CoverMyMeds. Will update once we receive a response.  Key: [id number]

## 2024-10-30 NOTE — Addendum Note (Signed)
 Addended by: ISAIAH LENIS on: 10/30/2024 12:37 PM   Modules accepted: Level of Service

## 2024-11-04 NOTE — Telephone Encounter (Signed)
 See second telephone encounter from 1/12.  Nothing further needed.

## 2024-11-10 ENCOUNTER — Other Ambulatory Visit (HOSPITAL_COMMUNITY): Payer: Self-pay

## 2024-11-11 ENCOUNTER — Encounter: Payer: Self-pay | Admitting: Family

## 2024-11-11 ENCOUNTER — Other Ambulatory Visit: Payer: Self-pay | Admitting: Family

## 2024-11-11 ENCOUNTER — Other Ambulatory Visit (HOSPITAL_COMMUNITY): Payer: Self-pay

## 2024-11-11 DIAGNOSIS — E039 Hypothyroidism, unspecified: Secondary | ICD-10-CM

## 2024-11-12 ENCOUNTER — Other Ambulatory Visit: Payer: Self-pay

## 2024-11-12 DIAGNOSIS — E039 Hypothyroidism, unspecified: Secondary | ICD-10-CM

## 2024-11-12 MED ORDER — LEVOTHYROXINE SODIUM 75 MCG PO TABS: 75.0000 ug | ORAL_TABLET | Freq: Every day | ORAL | 3 refills | Status: AC

## 2024-11-12 NOTE — Telephone Encounter (Signed)
 LVM  to call back to see which [facility] pharmacy

## 2024-11-14 ENCOUNTER — Other Ambulatory Visit: Payer: Self-pay | Admitting: Family

## 2024-11-14 DIAGNOSIS — E039 Hypothyroidism, unspecified: Secondary | ICD-10-CM

## 2024-12-23 ENCOUNTER — Ambulatory Visit: Admitting: Family Medicine
